# Patient Record
Sex: Male | Born: 1935 | Marital: Married | Smoking: Former smoker
Health system: Southern US, Community
[De-identification: ages and names within clinical notes are randomized; demographics above are authoritative.]

## PROBLEM LIST (undated history)

## (undated) DIAGNOSIS — Z87440 Personal history of urinary (tract) infections: Secondary | ICD-10-CM

## (undated) DIAGNOSIS — K921 Melena: Secondary | ICD-10-CM

## (undated) DIAGNOSIS — R030 Elevated blood-pressure reading, without diagnosis of hypertension: Secondary | ICD-10-CM

## (undated) DIAGNOSIS — Z5189 Encounter for other specified aftercare: Secondary | ICD-10-CM

## (undated) DIAGNOSIS — H409 Unspecified glaucoma: Secondary | ICD-10-CM

## (undated) DIAGNOSIS — B019 Varicella without complication: Secondary | ICD-10-CM

## (undated) DIAGNOSIS — I38 Endocarditis, valve unspecified: Secondary | ICD-10-CM

## (undated) DIAGNOSIS — K635 Polyp of colon: Secondary | ICD-10-CM

## (undated) DIAGNOSIS — E785 Hyperlipidemia, unspecified: Secondary | ICD-10-CM

## (undated) DIAGNOSIS — I2510 Atherosclerotic heart disease of native coronary artery without angina pectoris: Secondary | ICD-10-CM

## (undated) DIAGNOSIS — C61 Malignant neoplasm of prostate: Secondary | ICD-10-CM

## (undated) DIAGNOSIS — R011 Cardiac murmur, unspecified: Secondary | ICD-10-CM

## (undated) HISTORY — DX: Varicella without complication: B01.9

## (undated) HISTORY — DX: Malignant neoplasm of prostate: C61

## (undated) HISTORY — DX: Elevated blood-pressure reading, without diagnosis of hypertension: R03.0

## (undated) HISTORY — DX: Encounter for other specified aftercare: Z51.89

## (undated) HISTORY — DX: Hyperlipidemia, unspecified: E78.5

## (undated) HISTORY — PX: CORONARY ARTERY BYPASS GRAFT: SHX141

## (undated) HISTORY — PX: LUNG SURGERY: SHX703

## (undated) HISTORY — DX: Polyp of colon: K63.5

## (undated) HISTORY — DX: Unspecified glaucoma: H40.9

## (undated) HISTORY — DX: Melena: K92.1

## (undated) HISTORY — PX: PROSTATE SURGERY: SHX751

## (undated) HISTORY — DX: Cardiac murmur, unspecified: R01.1

## (undated) HISTORY — DX: Personal history of urinary (tract) infections: Z87.440

---

## 2016-03-21 ENCOUNTER — Telehealth: Payer: Self-pay | Admitting: *Deleted

## 2016-03-21 NOTE — Telephone Encounter (Signed)
Unable to reach patient at time of Pre-Visit Call.  Left message for patient to return call when available.    

## 2016-03-22 ENCOUNTER — Encounter: Payer: Self-pay | Admitting: Family Medicine

## 2016-03-22 ENCOUNTER — Ambulatory Visit (INDEPENDENT_AMBULATORY_CARE_PROVIDER_SITE_OTHER): Payer: Medicare Other | Admitting: Family Medicine

## 2016-03-22 VITALS — BP 124/50 | HR 83 | Temp 98.6°F | Ht 68.5 in | Wt 100.8 lb

## 2016-03-22 DIAGNOSIS — Z9889 Other specified postprocedural states: Secondary | ICD-10-CM | POA: Diagnosis not present

## 2016-03-22 DIAGNOSIS — Z933 Colostomy status: Secondary | ICD-10-CM | POA: Diagnosis not present

## 2016-03-22 DIAGNOSIS — R5381 Other malaise: Secondary | ICD-10-CM

## 2016-03-22 DIAGNOSIS — R1013 Epigastric pain: Secondary | ICD-10-CM

## 2016-03-22 DIAGNOSIS — C61 Malignant neoplasm of prostate: Secondary | ICD-10-CM | POA: Diagnosis not present

## 2016-03-22 DIAGNOSIS — R636 Underweight: Secondary | ICD-10-CM | POA: Insufficient documentation

## 2016-03-22 DIAGNOSIS — G8929 Other chronic pain: Secondary | ICD-10-CM

## 2016-03-22 MED ORDER — HYDROCODONE-ACETAMINOPHEN 5-325 MG PO TABS: 1.0000 | ORAL_TABLET | Freq: Four times a day (QID) | ORAL | 0 refills | Status: DC | PRN

## 2016-03-22 NOTE — Progress Notes (Signed)
[place] at [facility] [address], [place], [place] [address] [phone]  Date:  03/22/2016   Name:  Joseph Carrillo   DOB:  1926/09/10   MRN:  [id number]  PCP:  No primary care provider on file.    Chief Complaint: Establish Care (Pt is here to est care. constant abd pain that has been present over a year. )   History of Present Illness:  Joseph Carrillo is a 82 y.o. very pleasant male patient who presents with the following:  He is here today as a new patient- he moved to [place] from [facility] to live with his daughter Joseph Carrillo.   He was admitted to the hospital in [facility] in July for nearly a month for a "UTI", and he was then in a rehab facility for about 2 weeks. He was released from rehab this past weekend and moved to  so his daughter could help care for him  He has a suprapubic urostomy and colostomy bag due to prostate cancer; this surgery was done about 2 years ago.  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   He has noticed abdominal pain for about 6 months.  This has not changed.  He indicates the epigastric area as where he has the pain.  Advised him that I would like to get records and see what work-up is already done for this chronic problem prior to ordering studies and he agrees   No fever noted.  He is very thin- I am not sure if he has lost weight or if this is baseline He has some memory difficulty At home he uses a walker, but they use a WC for longer outings  ROS is limited by patient's memory loss.  He noted pain in his belly, pain in his groin.  Otherwise he has no complaint    There are no active problems to display for this patient.   Past Medical History:   Diagnosis Date  . Blood in stool   . Blood transfusion without reported diagnosis   . Chicken pox   . Colon polyp   . Elevated blood pressure reading   . Glaucoma   . Heart murmur   . Hx: UTI (urinary tract infection)   . Hyperlipidemia   . Prostate cancer [facility])     Past Surgical History:  Procedure Laterality Date  . CORONARY ARTERY BYPASS GRAFT    . LUNG SURGERY    . PROSTATE SURGERY      Social History  Substance Use Topics  . Smoking status: Former Research scientist (life sciences)  . Smokeless tobacco: Never Used  . Alcohol use No    No family history on file.  Not on File  Medication list has been reviewed and updated.  No current outpatient prescriptions on file prior to visit.   No current facility-administered medications on file prior to visit.     Review of Systems:  As per HPI- otherwise negative.  Physical Examination: Vitals:   03/22/16 1324  BP: (!) 124/50  Pulse: 83  Temp: 98.6 F (37 C)   Vitals:   03/22/16 1324  Weight: 100 lb 12.8 oz (  45.7 kg)  Height: 5' 8.5" (1.74 m)   Body mass index is 15.1 kg/m. Ideal Body Weight: Weight in (lb) to have BMI = 25: 166.5  GEN:NAD, Non-toxic, A & O x 3, very thin debilitated appearing man sitting in wheel chair.  Strong odor of urine HEENT: Atraumatic, Normocephalic. Neck supple. No masses, No LAD. Ears and Nose: No external deformity. CV: RRR, No M/G/R. No JVD. No thrill. No extra heart sounds. PULM: CTA B, no wheezes, crackles, rhonchi. No retractions. No resp. distress. No accessory muscle use. ABD: S, NT, ND, very thin Suprapubic cath appears to be clean and in good repair.  Colostomy bag is smelly and I am not sure if they are sealing it properly onto his skin.   He notes discomfort in her genital region- exam shows that he persistently drips some urine from his penis.  His diaper area is very malodorous and damp.  He is wearing a depends type undergarment with sanitary napkins inside to catch urine EXTR: No  c/c/e NEURO : moves all extremities and is able to stand on his own briefly PSYCH: poor historian- daughter endorses some dementia/ memory loss.  He is not delirious and is able to answer some questions and follows commands   Assessment and Plan: History of urostomy - Plan: Ambulatory referral to Urology, Ambulatory referral to [place] in place [facility]) - Plan: Ambulatory referral to Gastroenterology, Ambulatory referral to Home Health  Underweight - Plan: Ambulatory referral to [facility]) - Plan: Ambulatory referral to Home Health  Abdominal pain, chronic, epigastric - Plan: HYDROcodone-acetaminophen (NORCO/VICODIN) 5-325 MG tablet  Physical debility  Here today to establish care. [name] is an elderly man with history of urostomy and colonoscopy as a result of his prostate cancer.  He was recently discharged from a rehab facility and came to live with his daughter.  I do not think that Joseph Carrillo is being neglectful in any way but she does not have training or experience in taking care of this type of patient.  They urgently need skilled nursing as his hygiene is very poor and I am afraid he will soon have skin breakdown in his groin.  Ordered Home Health for him ASAP Referrals made to urology and also GI to help manage his stomas They are aware that he is underweight and are trying to work on this Refilled his hydrocodone which they report he uses chronically for pain.  We called his pharmacy in [facility] to confirm dosage Asked for his past medical records especially from recent hospital stay so I can learn more about his history   Went over all his medications and helped organize them.  Will taper him off clonidine as his BP is very low  Plan close follow-up in 2-3 weeks- sooner if not doing ok.   Signed [name], MD

## 2016-03-22 NOTE — Patient Instructions (Addendum)
It was very nice to meet you today.  I am going to refer you to urology doctor asap We will also get you set up to see a GI doctor. Please do a records release for [place] at check out.  We are contacting [facility] to get you some help Continue taking your aspirin and atorvastatin  I would like to get you off the clonidine- your blood pressure is too low. Please cut back to just one dose a day for 2 days, then stop taking it.   Please see me in 2-3 weeks to check on how you are doing

## 2016-03-22 NOTE — Progress Notes (Signed)
Pre visit review using our clinic review tool, if applicable. No additional management support is needed unless otherwise documented below in the visit note. 

## 2016-03-27 ENCOUNTER — Telehealth: Payer: Self-pay | Admitting: Family Medicine

## 2016-03-27 NOTE — Telephone Encounter (Signed)
Pt's daughter Joseph Carrillo [id number], called in to follow up on home health referral. She would like a call back to discuss further.    Thanks.

## 2016-03-27 NOTE — Telephone Encounter (Signed)
I spoke with daughter and informed her [facility] will contact her to schedule visit

## 2016-03-28 ENCOUNTER — Encounter (HOSPITAL_COMMUNITY): Payer: Self-pay | Admitting: Emergency Medicine

## 2016-03-28 ENCOUNTER — Inpatient Hospital Stay (HOSPITAL_COMMUNITY): Payer: Medicare Other

## 2016-03-28 ENCOUNTER — Inpatient Hospital Stay (HOSPITAL_COMMUNITY)
Admission: EM | Admit: 2016-03-28 | Discharge: 2016-04-05 | DRG: 693 | Disposition: A | Discharge status: Skilled Nursing Facility | Payer: Medicare Other | Attending: Internal Medicine | Admitting: Internal Medicine

## 2016-03-28 DIAGNOSIS — N1330 Unspecified hydronephrosis: Principal | ICD-10-CM

## 2016-03-28 DIAGNOSIS — N186 End stage renal disease: Secondary | ICD-10-CM | POA: Diagnosis not present

## 2016-03-28 DIAGNOSIS — N179 Acute kidney failure, unspecified: Secondary | ICD-10-CM | POA: Diagnosis present

## 2016-03-28 DIAGNOSIS — C61 Malignant neoplasm of prostate: Secondary | ICD-10-CM | POA: Diagnosis present

## 2016-03-28 DIAGNOSIS — R1013 Epigastric pain: Secondary | ICD-10-CM

## 2016-03-28 DIAGNOSIS — N390 Urinary tract infection, site not specified: Secondary | ICD-10-CM | POA: Diagnosis present

## 2016-03-28 DIAGNOSIS — Z8546 Personal history of malignant neoplasm of prostate: Secondary | ICD-10-CM

## 2016-03-28 DIAGNOSIS — E43 Unspecified severe protein-calorie malnutrition: Secondary | ICD-10-CM | POA: Diagnosis present

## 2016-03-28 DIAGNOSIS — N184 Chronic kidney disease, stage 4 (severe): Secondary | ICD-10-CM | POA: Diagnosis present

## 2016-03-28 DIAGNOSIS — N183 Chronic kidney disease, stage 3 (moderate): Secondary | ICD-10-CM | POA: Diagnosis not present

## 2016-03-28 DIAGNOSIS — K219 Gastro-esophageal reflux disease without esophagitis: Secondary | ICD-10-CM | POA: Diagnosis present

## 2016-03-28 DIAGNOSIS — Z9359 Other cystostomy status: Secondary | ICD-10-CM | POA: Diagnosis not present

## 2016-03-28 DIAGNOSIS — Z952 Presence of prosthetic heart valve: Secondary | ICD-10-CM | POA: Diagnosis not present

## 2016-03-28 DIAGNOSIS — D500 Iron deficiency anemia secondary to blood loss (chronic): Secondary | ICD-10-CM | POA: Diagnosis not present

## 2016-03-28 DIAGNOSIS — D631 Anemia in chronic kidney disease: Secondary | ICD-10-CM | POA: Diagnosis present

## 2016-03-28 DIAGNOSIS — R911 Solitary pulmonary nodule: Secondary | ICD-10-CM | POA: Diagnosis present

## 2016-03-28 DIAGNOSIS — H409 Unspecified glaucoma: Secondary | ICD-10-CM | POA: Diagnosis present

## 2016-03-28 DIAGNOSIS — Z79899 Other long term (current) drug therapy: Secondary | ICD-10-CM | POA: Diagnosis not present

## 2016-03-28 DIAGNOSIS — Z87891 Personal history of nicotine dependence: Secondary | ICD-10-CM | POA: Diagnosis not present

## 2016-03-28 DIAGNOSIS — D649 Anemia, unspecified: Secondary | ICD-10-CM | POA: Diagnosis present

## 2016-03-28 DIAGNOSIS — E785 Hyperlipidemia, unspecified: Secondary | ICD-10-CM | POA: Diagnosis present

## 2016-03-28 DIAGNOSIS — G8929 Other chronic pain: Secondary | ICD-10-CM

## 2016-03-28 DIAGNOSIS — N289 Disorder of kidney and ureter, unspecified: Secondary | ICD-10-CM

## 2016-03-28 DIAGNOSIS — I2510 Atherosclerotic heart disease of native coronary artery without angina pectoris: Secondary | ICD-10-CM | POA: Diagnosis present

## 2016-03-28 DIAGNOSIS — N189 Chronic kidney disease, unspecified: Secondary | ICD-10-CM | POA: Diagnosis not present

## 2016-03-28 DIAGNOSIS — Z933 Colostomy status: Secondary | ICD-10-CM

## 2016-03-28 DIAGNOSIS — R109 Unspecified abdominal pain: Secondary | ICD-10-CM

## 2016-03-28 DIAGNOSIS — Z681 Body mass index (BMI) 19 or less, adult: Secondary | ICD-10-CM

## 2016-03-28 DIAGNOSIS — D72829 Elevated white blood cell count, unspecified: Secondary | ICD-10-CM | POA: Diagnosis not present

## 2016-03-28 DIAGNOSIS — N185 Chronic kidney disease, stage 5: Secondary | ICD-10-CM | POA: Diagnosis not present

## 2016-03-28 DIAGNOSIS — N3000 Acute cystitis without hematuria: Secondary | ICD-10-CM

## 2016-03-28 DIAGNOSIS — E875 Hyperkalemia: Secondary | ICD-10-CM | POA: Diagnosis present

## 2016-03-28 DIAGNOSIS — Z923 Personal history of irradiation: Secondary | ICD-10-CM | POA: Diagnosis not present

## 2016-03-28 DIAGNOSIS — Z951 Presence of aortocoronary bypass graft: Secondary | ICD-10-CM

## 2016-03-28 DIAGNOSIS — R5381 Other malaise: Secondary | ICD-10-CM | POA: Diagnosis present

## 2016-03-28 DIAGNOSIS — T83512A Infection and inflammatory reaction due to nephrostomy catheter, initial encounter: Secondary | ICD-10-CM

## 2016-03-28 DIAGNOSIS — R7989 Other specified abnormal findings of blood chemistry: Secondary | ICD-10-CM | POA: Diagnosis not present

## 2016-03-28 HISTORY — DX: Endocarditis, valve unspecified: I38

## 2016-03-28 HISTORY — DX: Atherosclerotic heart disease of native coronary artery without angina pectoris: I25.10

## 2016-03-28 LAB — BASIC METABOLIC PANEL
ANION GAP: 9 (ref 5–15)
Anion gap: 11 (ref 5–15)
BUN: 79 mg/dL — AB (ref 6–20)
BUN: 79 mg/dL — ABNORMAL HIGH (ref 6–20)
CALCIUM: 9.9 mg/dL (ref 8.9–10.3)
CALCIUM: 9.7 mg/dL (ref 8.9–10.3)
CHLORIDE: 118 mmol/L — AB (ref 101–111)
CO2: 13 mmol/L — AB (ref 22–32)
CO2: 19 mmol/L — AB (ref 22–32)
CREATININE: 3.45 mg/dL — AB (ref 0.61–1.24)
CREATININE: 3.5 mg/dL — AB (ref 0.61–1.24)
Chloride: 114 mmol/L — ABNORMAL HIGH (ref 101–111)
GFR calc Af Amer: 17 mL/min — ABNORMAL LOW (ref >60)
GFR calc Af Amer: 17 mL/min — ABNORMAL LOW (ref >60)
GFR calc non Af Amer: 15 mL/min — ABNORMAL LOW (ref >60)
GFR, EST NON AFRICAN AMERICAN: 15 mL/min — AB (ref >60)
GLUCOSE: 206 mg/dL — AB (ref 65–99)
GLUCOSE: 153 mg/dL — AB (ref 65–99)
Potassium: 7.2 mmol/L (ref 3.5–5.1)
Potassium: 5.8 mmol/L — ABNORMAL HIGH (ref 3.5–5.1)
Sodium: 142 mmol/L (ref 135–145)
Sodium: 142 mmol/L (ref 135–145)

## 2016-03-28 LAB — URINALYSIS, ROUTINE W REFLEX MICROSCOPIC
BILIRUBIN URINE: NEGATIVE
GLUCOSE, UA: NEGATIVE mg/dL
KETONES UR: NEGATIVE mg/dL
Nitrite: NEGATIVE
PH: 7 (ref 5.0–8.0)
Specific Gravity, Urine: 1.016 (ref 1.005–1.030)

## 2016-03-28 LAB — COMPREHENSIVE METABOLIC PANEL WITH GFR
ALT: 62 U/L (ref 17–63)
AST: 38 U/L (ref 15–41)
Albumin: 3.1 g/dL — ABNORMAL LOW (ref 3.5–5.0)
Alkaline Phosphatase: 61 U/L (ref 38–126)
Anion gap: 6 (ref 5–15)
BUN: 87 mg/dL — ABNORMAL HIGH (ref 6–20)
CO2: 19 mmol/L — ABNORMAL LOW (ref 22–32)
Calcium: 9.5 mg/dL (ref 8.9–10.3)
Chloride: 115 mmol/L — ABNORMAL HIGH (ref 101–111)
Creatinine, Ser: 3.6 mg/dL — ABNORMAL HIGH (ref 0.61–1.24)
GFR calc Af Amer: 16 mL/min — ABNORMAL LOW
GFR calc non Af Amer: 14 mL/min — ABNORMAL LOW
Glucose, Bld: 90 mg/dL (ref 65–99)
Potassium: >7.5 mmol/L (ref 3.5–5.1)
Sodium: 140 mmol/L (ref 135–145)
Total Bilirubin: 0.9 mg/dL (ref 0.3–1.2)
Total Protein: 7.2 g/dL (ref 6.5–8.1)

## 2016-03-28 LAB — CBC WITH DIFFERENTIAL/PLATELET
BASOS ABS: 0 10*3/uL (ref 0.0–0.1)
Basophils Relative: 0 %
Eosinophils Absolute: 0.2 10*3/uL (ref 0.0–0.7)
Eosinophils Relative: 1 %
HEMATOCRIT: 30.1 % — AB (ref 39.0–52.0)
Hemoglobin: 9.3 g/dL — ABNORMAL LOW (ref 13.0–17.0)
LYMPHS ABS: 0.9 10*3/uL (ref 0.7–4.0)
Lymphocytes Relative: 6 %
MCH: 27.6 pg (ref 26.0–34.0)
MCHC: 30.9 g/dL (ref 30.0–36.0)
MCV: 89.3 fL (ref 78.0–100.0)
MONO ABS: 1.1 10*3/uL — AB (ref 0.1–1.0)
MONOS PCT: 7 %
NEUTROS ABS: 12.8 10*3/uL — AB (ref 1.7–7.7)
Neutrophils Relative %: 86 %
PLATELETS: 188 10*3/uL (ref 150–400)
RBC: 3.37 MIL/uL — ABNORMAL LOW (ref 4.22–5.81)
RDW: 17.3 % — AB (ref 11.5–15.5)
WBC: 15 10*3/uL — AB (ref 4.0–10.5)

## 2016-03-28 LAB — URINE MICROSCOPIC-ADD ON

## 2016-03-28 LAB — TROPONIN I: TROPONIN I: 0.03 ng/mL — AB (ref <0.03)

## 2016-03-28 LAB — PROTIME-INR
INR: 1.09
PROTHROMBIN TIME: 14.1 s (ref 11.4–15.2)

## 2016-03-28 LAB — MRSA PCR SCREENING: MRSA by PCR: NEGATIVE

## 2016-03-28 LAB — APTT: APTT: 42 s — AB (ref 24–36)

## 2016-03-28 MED ORDER — SODIUM BICARBONATE 8.4 % IV SOLN
INTRAVENOUS | Status: DC
  Administered 2016-03-28 – 2016-03-30 (×4): via INTRAVENOUS
  Filled 2016-03-28 (×9): qty 50

## 2016-03-28 MED ORDER — ALBUTEROL SULFATE (2.5 MG/3ML) 0.083% IN NEBU: 10.0000 mg | INHALATION_SOLUTION | Freq: Once | RESPIRATORY_TRACT | Status: DC

## 2016-03-28 MED ORDER — HEPARIN SODIUM (PORCINE) 5000 UNIT/ML IJ SOLN: 5000.0000 [IU] | Freq: Three times a day (TID) | INTRAMUSCULAR | Status: DC

## 2016-03-28 MED ORDER — ASPIRIN 325 MG PO TABS: 325.0000 mg | ORAL_TABLET | ORAL | Status: DC

## 2016-03-28 MED ORDER — IOPAMIDOL (ISOVUE-300) INJECTION 61%
15.0000 mL | Freq: Once | INTRAVENOUS | Status: AC | PRN
  Administered 2016-03-28: 15 mL via ORAL

## 2016-03-28 MED ORDER — ALBUTEROL (5 MG/ML) CONTINUOUS INHALATION SOLN
10.0000 mg/h | INHALATION_SOLUTION | RESPIRATORY_TRACT | Status: DC
  Administered 2016-03-28: 10 mg/h via RESPIRATORY_TRACT
  Filled 2016-03-28: qty 20

## 2016-03-28 MED ORDER — MORPHINE SULFATE (PF) 2 MG/ML IV SOLN: 1.0000 mg | INTRAVENOUS | Status: DC | PRN

## 2016-03-28 MED ORDER — NITROGLYCERIN IN D5W 200-5 MCG/ML-% IV SOLN
0.0000 ug/min | INTRAVENOUS | Status: DC
  Filled 2016-03-28: qty 250

## 2016-03-28 MED ORDER — HYDRALAZINE HCL 20 MG/ML IJ SOLN: 10.0000 mg | INTRAMUSCULAR | Status: DC | PRN

## 2016-03-28 MED ORDER — CLONIDINE HCL ER 0.1 MG PO TB12
0.1000 mg | ORAL_TABLET | Freq: Two times a day (BID) | ORAL | Status: DC
  Administered 2016-03-28 – 2016-04-05 (×16): 0.1 mg via ORAL
  Filled 2016-03-28 (×20): qty 1

## 2016-03-28 MED ORDER — METOPROLOL TARTRATE 12.5 MG HALF TABLET
12.5000 mg | ORAL_TABLET | Freq: Two times a day (BID) | ORAL | Status: DC
  Administered 2016-03-28 – 2016-04-05 (×15): 12.5 mg via ORAL
  Filled 2016-03-28 (×15): qty 1

## 2016-03-28 MED ORDER — HYDRALAZINE HCL 20 MG/ML IJ SOLN
10.0000 mg | INTRAMUSCULAR | Status: DC | PRN
  Administered 2016-03-28: 10 mg via INTRAVENOUS
  Filled 2016-03-28: qty 1

## 2016-03-28 MED ORDER — SODIUM POLYSTYRENE SULFONATE 15 GM/60ML PO SUSP
30.0000 g | Freq: Once | ORAL | Status: AC
  Administered 2016-03-28: 30 g via ORAL
  Filled 2016-03-28: qty 120

## 2016-03-28 MED ORDER — ATORVASTATIN CALCIUM 20 MG PO TABS
20.0000 mg | ORAL_TABLET | Freq: Every day | ORAL | Status: DC
  Administered 2016-03-28 – 2016-04-05 (×9): 20 mg via ORAL
  Filled 2016-03-28 (×8): qty 1
  Filled 2016-03-28: qty 2

## 2016-03-28 MED ORDER — SODIUM CHLORIDE 0.9% FLUSH
3.0000 mL | Freq: Two times a day (BID) | INTRAVENOUS | Status: DC
  Administered 2016-03-29 – 2016-03-31 (×2): 3 mL via INTRAVENOUS

## 2016-03-28 MED ORDER — SODIUM CHLORIDE 0.9 % IV BOLUS (SEPSIS)
1000.0000 mL | Freq: Once | INTRAVENOUS | Status: AC
  Administered 2016-03-28: 1000 mL via INTRAVENOUS

## 2016-03-28 MED ORDER — SODIUM POLYSTYRENE SULFONATE 15 GM/60ML PO SUSP
45.0000 g | Freq: Once | ORAL | Status: AC
  Administered 2016-03-28: 45 g via ORAL
  Filled 2016-03-28: qty 180

## 2016-03-28 MED ORDER — NITROGLYCERIN 0.4 MG SL SUBL
SUBLINGUAL_TABLET | SUBLINGUAL | Status: AC
  Filled 2016-03-28: qty 1

## 2016-03-28 MED ORDER — SODIUM CHLORIDE 0.9 % IV SOLN
1.0000 g | Freq: Once | INTRAVENOUS | Status: AC
  Administered 2016-03-28: 1 g via INTRAVENOUS
  Filled 2016-03-28: qty 10

## 2016-03-28 MED ORDER — ASPIRIN 81 MG PO CHEW
324.0000 mg | CHEWABLE_TABLET | Freq: Once | ORAL | Status: AC
  Administered 2016-03-28: 324 mg via ORAL

## 2016-03-28 MED ORDER — NITROGLYCERIN 0.4 MG SL SUBL
0.4000 mg | SUBLINGUAL_TABLET | SUBLINGUAL | Status: DC | PRN
  Administered 2016-03-28: 0.4 mg via SUBLINGUAL

## 2016-03-28 MED ORDER — HEPARIN (PORCINE) IN NACL 100-0.45 UNIT/ML-% IJ SOLN
600.0000 [IU]/h | INTRAMUSCULAR | Status: DC
  Administered 2016-03-28: 600 [IU]/h via INTRAVENOUS
  Filled 2016-03-28: qty 250

## 2016-03-28 MED ORDER — DEXTROSE 5 % IV SOLN
1.0000 g | INTRAVENOUS | Status: DC
  Administered 2016-03-28 – 2016-04-04 (×7): 1 g via INTRAVENOUS
  Filled 2016-03-28 (×9): qty 10

## 2016-03-28 MED ORDER — HEPARIN BOLUS VIA INFUSION
3000.0000 [IU] | Freq: Once | INTRAVENOUS | Status: AC
  Administered 2016-03-28: 3000 [IU] via INTRAVENOUS
  Filled 2016-03-28: qty 3000

## 2016-03-28 MED ORDER — SODIUM BICARBONATE 8.4 % IV SOLN
50.0000 meq | Freq: Once | INTRAVENOUS | Status: AC
  Administered 2016-03-28: 50 meq via INTRAVENOUS
  Filled 2016-03-28: qty 50

## 2016-03-28 MED ORDER — INSULIN ASPART 100 UNIT/ML IV SOLN
10.0000 [IU] | Freq: Once | INTRAVENOUS | Status: AC
  Administered 2016-03-28: 10 [IU] via INTRAVENOUS
  Filled 2016-03-28: qty 0.1

## 2016-03-28 MED ORDER — ASPIRIN 81 MG PO CHEW
CHEWABLE_TABLET | ORAL | Status: AC
  Filled 2016-03-28: qty 4

## 2016-03-28 MED ORDER — DEXTROSE 50 % IV SOLN
1.0000 | Freq: Once | INTRAVENOUS | Status: AC
  Administered 2016-03-28: 50 mL via INTRAVENOUS
  Filled 2016-03-28: qty 50

## 2016-03-28 MED ORDER — HYDROCODONE-ACETAMINOPHEN 5-325 MG PO TABS
1.0000 | ORAL_TABLET | Freq: Four times a day (QID) | ORAL | Status: DC | PRN
  Administered 2016-03-28 – 2016-04-04 (×12): 1 via ORAL
  Filled 2016-03-28 (×13): qty 1

## 2016-03-28 MED ORDER — VANCOMYCIN HCL IN DEXTROSE 750-5 MG/150ML-% IV SOLN
750.0000 mg | Freq: Once | INTRAVENOUS | Status: AC
  Administered 2016-03-28: 750 mg via INTRAVENOUS
  Filled 2016-03-28: qty 150

## 2016-03-28 NOTE — Discharge Planning (Signed)
Thank you to ED team at [facility] for the care of this patient.  [facility] is currently active with this patient for SN, PT, OT, and Speech Therapy.  I have notified [name] [facility] of our status.

## 2016-03-28 NOTE — Consult Note (Signed)
Urology Consult  Referring physician: Reece Packer Reason for referral: Hydronephrosis  Chief Complaint: Hydronephrosis  History of Present Illness: Patient admitted with abdominal pain and decreased urine output and s/p tube; on iv antibiotics and hydration; recent admission for UTI in [place]; history of prostate cancer and colostomy; serum Cr 3.6 with no baseline available; WBC 15; serum Cr> 7.5; dehydrated on admission; afebrile; BMP being carefully followed as noted;   Patient has needed dialysis once before  CTscan: bilateral hydro and hydroureter (right greater than left); bladder is decompressed; no stones; prostate enlarged; PROSTATE is impressively enlarged  It sounds like the patient had radiation therapy many years ago for prostate cancer; 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 Blood in stool   . Blood transfusion without reported diagnosis   . Chicken pox   . Colon polyp   . Elevated blood pressure reading   . Glaucoma   . Heart murmur   . Hx: UTI (urinary tract infection)   . Hyperlipidemia   . Prostate cancer [facility])    Past Surgical History:  Procedure Laterality Date  . CORONARY ARTERY BYPASS GRAFT    . LUNG SURGERY    . PROSTATE SURGERY      Medications: I have reviewed the patient's current medications. Allergies: No Known Allergies  History reviewed. No pertinent family history. Social History:  reports that he has quit smoking. He has never used smokeless tobacco. He reports that he does not drink alcohol or use drugs.  ROS: All systems are reviewed  and negative except as noted. Rest negative  Physical Exam:  Vital signs in last 24 hours: Temp:  [97.9 F (36.6 C)-98.7 F (37.1 C)] 98.7 F (37.1 C) (09/06 1600) Pulse Rate:  [55-103] 102 (09/06 1610) Resp:  [16-18] 18 (09/06 1610) BP: (154-200)/(49-71) 185/68 (09/06 1610) SpO2:  [99 %-100 %] 99 % (09/06 1610) Weight:  [51.6 kg (113 lb 12.1 oz)] 51.6 kg (113 lb 12.1 oz) (09/06 1600)  Cardiovascular: Skin warm; not flushed Respiratory: Breaths quiet; no shortness of breath Abdomen: No masses Neurological: Normal sensation to touch Musculoskeletal: Normal motor function arms and legs Lymphatics: No inguinal adenopathy Skin: No rashes Genitourinary:s/p tube draining well high volume  Laboratory Data:  Results for orders placed or performed during the hospital encounter of 03/28/16 (from the past 72 hour(s))  Urinalysis, Routine w reflex microscopic     Status: Abnormal   Collection Time: 03/28/16  9:49 AM  Result Value Ref Range   Color, Urine YELLOW YELLOW   APPearance TURBID (A) CLEAR   Specific Gravity, Urine 1.016 1.005 - 1.030   pH 7.0 5.0 - 8.0   Glucose, UA NEGATIVE NEGATIVE mg/dL   Hgb urine dipstick LARGE (A) NEGATIVE   Bilirubin Urine NEGATIVE NEGATIVE   Ketones, ur NEGATIVE NEGATIVE mg/dL   Protein, ur >300 (A) NEGATIVE mg/dL   Nitrite NEGATIVE NEGATIVE   Leukocytes, UA LARGE (A) NEGATIVE  Urine microscopic-add on     Status: Abnormal   Collection Time: 03/28/16  9:49 AM  Result Value Ref Range   Squamous Epithelial / LPF 0-5 (A) NONE SEEN  WBC, UA TOO NUMEROUS TO COUNT 0 - 5 WBC/hpf   RBC / HPF 6-30 0 - 5 RBC/hpf   Bacteria, UA MANY (A) NONE SEEN  Comprehensive metabolic panel     Status: Abnormal   Collection Time: 03/28/16 10:07 AM  Result Value Ref Range   Sodium 140 135 - 145 mmol/L   Potassium >7.5 (HH) 3.5 - 5.1 mmol/L    Comment: NO VISIBLE HEMOLYSIS REPEATED TO VERIFY CRITICAL RESULT CALLED TO, READ BACK BY AND VERIFIED WITH: KESLAR,R. RN AT  1100 03/28/16 MULLINS,T    Chloride 115 (H) 101 - 111 mmol/L   CO2 19 (L) 22 - 32 mmol/L   Glucose, Bld 90 65 - 99 mg/dL   BUN 87 (H) 6 - 20 mg/dL   Creatinine, Ser 3.60 (H) 0.61 - 1.24 mg/dL   Calcium 9.5 8.9 - 10.3 mg/dL   Total Protein 7.2 6.5 - 8.1 g/dL   Albumin 3.1 (L) 3.5 - 5.0 g/dL   AST 38 15 - 41 U/L   ALT 62 17 - 63 U/L   Alkaline Phosphatase 61 38 - 126 U/L   Total Bilirubin 0.9 0.3 - 1.2 mg/dL   GFR calc non Af Amer 14 (L) >60 mL/min   GFR calc Af Amer 16 (L) >60 mL/min    Comment: (NOTE) The eGFR has been calculated using the CKD EPI equation. This calculation has not been validated in all clinical situations. eGFR's persistently <60 mL/min signify possible Chronic Kidney Disease.    Anion gap 6 5 - 15  CBC with Differential     Status: Abnormal   Collection Time: 03/28/16 10:07 AM  Result Value Ref Range   WBC 15.0 (H) 4.0 - 10.5 K/uL   RBC 3.37 (L) 4.22 - 5.81 MIL/uL   Hemoglobin 9.3 (L) 13.0 - 17.0 g/dL   HCT 30.1 (L) 39.0 - 52.0 %   MCV 89.3 78.0 - 100.0 fL   MCH 27.6 26.0 - 34.0 pg   MCHC 30.9 30.0 - 36.0 g/dL   RDW 17.3 (H) 11.5 - 15.5 %   Platelets 188 150 - 400 K/uL   Neutrophils Relative % 86 %   Lymphocytes Relative 6 %   Monocytes Relative 7 %   Eosinophils Relative 1 %   Basophils Relative 0 %   Neutro Abs 12.8 (H) 1.7 - 7.7 K/uL   Lymphs Abs 0.9 0.7 - 4.0 K/uL   Monocytes Absolute 1.1 (H) 0.1 - 1.0 K/uL   Eosinophils Absolute 0.2 0.0 - 0.7 K/uL   Basophils Absolute 0.0 0.0 - 0.1 K/uL   Smear Review MORPHOLOGY UNREMARKABLE    No results found for this or any previous visit (from the past 240 hour(s)). Creatinine:  Recent Labs  03/28/16 1007  CREATININE 3.60*    Xrays: See report/chart reviewed  Impression/Assessment:  This patient likely has chronic hydro and chronic renal insufficiency Will follow If forced to do so for renal failure/sepsis, etc he would need perc tubes - unlikely to get stents in due to radiation/large  prostate/and described fistula  Plan:  Will follow Hydrate and follow for ? Perc tubes Suggest could you ask daughter or do research on [place] admission last week- they would have done labs for baseline renal function and perhaps a renal xray; similarly apparently a urologist Dr Carlis Abbott did s/p tube one year ago in [place]- ? Get medical records  Anahlia Iseminger A 03/28/2016, 5:15 PM

## 2016-03-28 NOTE — Progress Notes (Signed)
ANTICOAGULATION CONSULT NOTE - Initial Consult  Pharmacy Consult for heparin IV Indication: chest pain/ACS  No Known Allergies  Patient Measurements: Height: 5\' 8"  (172.7 cm) Weight: 113 lb 12.1 oz (51.6 kg) IBW/kg (Calculated) : 68.4 Heparin Dosing Weight: TBW  Vital Signs: Temp: 99 F (37.2 C) (09/06 1940) Temp Source: Oral (09/06 1940) BP: 119/67 (09/06 2109) Pulse Rate: 113 (09/06 2109)  Labs:  Recent Labs  03/28/16 1007 03/28/16 1808  HGB 9.3*  --   HCT 30.1*  --   PLT 188  --   CREATININE 3.60* 3.45*    Estimated Creatinine Clearance: 11.2 mL/min (by C-G formula based on SCr of 3.45 mg/dL).   Medical History: Past Medical History:  Diagnosis Date  . Blood in stool   . Blood transfusion without reported diagnosis   . Chicken pox   . Colon polyp   . Elevated blood pressure reading   . Glaucoma   . Heart murmur   . Hx: UTI (urinary tract infection)   . Hyperlipidemia   . Prostate cancer ([place])     Medications:  Prescriptions Prior to Admission  Medication Sig Dispense Refill Last Dose  . atorvastatin (LIPITOR) 20 MG tablet Take 20 mg by mouth daily.   03/27/2016 at Unknown time  . cloNIDine HCl (KAPVAY) 0.1 MG TB12 ER tablet Take 0.1 mg by mouth 2 (two) times daily.   03/27/2016 at Unknown time  . HYDROcodone-acetaminophen (NORCO/VICODIN) 5-325 MG tablet Take 1 tablet by mouth every 6 (six) hours as needed. 40 tablet 0 03/27/2016 at Unknown time  . sodium bicarbonate 650 MG tablet Take 650 mg by mouth 4 (four) times daily.   03/27/2016 at Unknown time  . nitroGLYCERIN (NITROSTAT) 0.4 MG SL tablet Place 0.4 mg under the tongue every 5 (five) minutes as needed for chest pain.   years ago   Scheduled:  . aspirin      . nitroGLYCERIN      . atorvastatin  20 mg Oral Daily  . cefTRIAXone (ROCEPHIN)  IV  1 g Intravenous Q24H  . cloNIDine HCl  0.1 mg Oral BID  . heparin  5,000 Units Subcutaneous Q8H  . sodium chloride flush  3 mL Intravenous Q12H     Assessment: Joseph Jenkinsis a88 y.o.malewith prostate cancer and suprapubic catheter, colostomy, recent hospitalization for UTI presents on 9/6/2017with 3-4 days abd pain and decreased UOP.  Now with chest pain, ordered NTG, ASA x1   Baseline INR, aPTT: pending  Prior anticoagulation: none  Significant events:  Today, 03/28/2016:  CBC: hgb low (likely d/t anemia from CKD), Plt wnl  No bleeding or infusion issues per nursing  CrCl: 11.2 ml/min  Goal of Therapy: Heparin level 0.3-0.7 units/ml Monitor platelets by anticoagulation protocol: Yes  Plan:  Heparin 3000 units IV bolus x 1  Heparin 600 units/hr IV infusion  Check heparin level 8 hrs after start  Daily CBC, daily heparin level once stable  Monitor for signs of bleeding or thrombosis   Reuel Boom, PharmD Pager: [phone] 03/28/2016, 9:20 PM

## 2016-03-28 NOTE — Progress Notes (Addendum)
Shift event: Brief HPI: Pt admitted by [facility] earlier today after presenting to ED with decreased UO, weakness, and abdominal pain. Found to have creat of 3.60 with K over 7. Attending spoke with nephrology who advised no HD urgently, so pt was admitted to [facility]. Tests also showed hydronephrosis and urology consulted. Urology plan to watch, treat medically for now, and consider perc tubes. Pt placed on Vanc and Rocephin for UTI (UTI with hospitalization at [facility] one month ago).  Pt was treated aggressively with potassium lowering medications (bicarb, Ca gluconate, insulin, kayexelate) to lower the K+ level. Next K+ at 1930 was slightly lower, and additional Kayexelate given. NP asked to f/up q4 hr BMP per attending. However, prior to lab resulting (due at 2200 hrs), around 2050 hrs, RN paged because pt was having left shoulder pain with ST elevation on tele. NP to bedside. 12 lead confirmed ST elevation.  S: Pt denies actual chest pain but states his left shoulder began to ache about 2 hours ago and is now worse and moving distally to biceps area. Dull ache 7/10. Per RN, pt had some c/o epigastric pain earlier in shift and was given Vicodin. Pt denies SOB at this time, but says has had some since admission. + weakness since admission. No n/v or diaphoresis. Hasn't used NTG tabs "in years". No SOB at home. Anticoag: says he doesn't remember ever being on Coumadin or Plavix. Used to be on a baby ASA qd but it was stopped for ? Reason. Says he received "shots in my belly" during last hospitalization and at rehab (which I assume to be Lovenox).  PMHx per pt: Valve surgery at [facility] in early 2000s and CABG in 2001. Hx prostate cancer with chronic SP tube placed one yr ago at [facility]. Cardiologist: Dr. Merrilyn Puma at [facility]. Hasn't seen any doctors in [place] yet. Other per H&P. Per wife: Pt did have what sounds to be peritoneal dialysis once for about 24 hours. Dr has never mentioned HD to them.She doesn't recall any hx blood  thinners either. She gives permission to get all of pt's records from [facility].  Activity/social: quit smoking in 1973. Lived at home with wife until one month ago when he was hospitalized for UTI and sent to rehab afterwards. Wife, unfortunately, is having cancer treatments in [place] and is living with her sister for now. After rehab, daughter brought pt to [place] to live here until wife/pt health problems get better and they can return to live in [facility]. Pt's baseline activity level is he walks with a walker.  O: Joseph Carrillo appears chronically ill but fairly well, not toxic appearing. Alert and oriented x 3. Pleasant, appropriate, appears comfortable. BP-174/90, 119/60, 140/80. HR 130 down to 106. RR normal. O2 sat normal on RA and normal on 2L O2. Card: tachycardic, + ST elevation on tele and EKG. ST elevation decreased over duration of visit. Resp: normal effort, CTA. Abd: Soft. MSK: MOE x 4. Neuro: non focal exam.  Skin: warm and dry.  A/P: 1. Left arm pain, epigastric pain ? lower sternal with EKG changes and ST elevation. NTG x 1 relieved pain. O2 per  2L placed. ASA 324mg  chewed. Called Dr. Wynonia Lawman on call for cardio. Case discussed along with findings and treatment. Recs: NTG drip, Heparin drip, BB. TF to [facility] but not urgent as pt is not a candidate for procedures at this time due to CKD and hyperkalemia. Cardio will see pt when transferred to [facility] or sooner if worsens. Pt  has multiple chronic health issues which likely will prevent him from invasive procedures and goals of care discussion is warranted.  Cycle troponins, r/p EKG in am. Lipid panel in am. NPO except meds until consults completed.  2. Code status: NP spoke with pt about code status. He wishes for everything to be done. FULL CODE.  3. Urinary retention-per urology, may need perc tube.  4. Hyperkalemia and CKD-follow labs and tx. Should K not decrease, call nephrology back. Monitor output.  TF to [facility] as bed available.  Family  communication: NP first called daughter, only name/number on chart. However, she informed me that pt was married and gave me the wife's number. Wife is Daian Schlegel [phone]. NP spoke with Mrs. Nihart. She informed me of various info as noted above. States that Mr. Stoneback has always wanted to be resuscitated. NP explained events of tonight, including treatment plan and possible worsening morbidity or even mortality.She understands, states "he has been through a lot". Wife wants to be called with any updates and wishes to be updated daily by attending. Will put her number on chart. Depending on her cancer treatments, she may be able to visit this weekend.  Clance Boll, NP [facility]  Update: Pt has been chest pain free since NTG SL. VSS.  Last K 5.8 and awaiting this one due now to see if further tx is needed. Creat remains about the same, elevated. Troponin #1 negative. KJKG, NP [facility] Update: [facility] transporting pt to [place], Rm [id number].  KJKG, NP [facility] Update: Report given to oncoming attending at 0700. BMP pending at this time. 2nd troponin .04. 6am EKG without noted ST elevation but will defer EKG eval and tx plan ultimately to cardiology.  KJKG, NP [facility]

## 2016-03-28 NOTE — ED Triage Notes (Signed)
Pt reports abd pain. Pt reports he has a suprapubic catheter. No urine in bag when he woke up, but started draining after awakening. Also reports L pinky toe pain.

## 2016-03-28 NOTE — H&P (Signed)
History and Physical    Joseph Carrillo [id number] DOB: June 14, 1927 DOA: 03/28/2016  PCP: [name], MD  Outpatient Specialists: none Patient coming from: daughters home. Moved here from [place] Complaint: Abdominal pain  History provided by patient and daughter who is at bedside.   HPI: Joseph Carrillo is a 82 y.o. male with medical history significant of suprapubic catheter due to prostate cancer, colostomy, recent hospitalization for UTI, who presents [place] with 3-4 days of abdominal pain and decreased urine output.  Patient was admitted to hospital in [place] for urinary tract infection and discharged to nursing facility for rehabilitation. He was discharged from nursing facility about a week ago and moved to [place] to live with his daughter.   Patient described the abdominal pain as stabbing and constant. He reports decreased urine output when lying but noted urine coming into a urine back when he stood. He denies blood in urine or ostomy bag. Daughter stated that patient gets catheter changed on the 11th of every month. Patient said that he was told he had history of kidney disease. Daughter thinks that patient received one session of hemodialysis in the past. He denies fever, chills or new back pain. Denies nausea, vomiting and diarrhea. Denies chest pain or shortness of breath.   ED Course: Vital signs within normal limits. CMP significant for potassium greater than 7.5, chloride 15, bicarbonate 19, BUN 87, creatinine 3.6. CBC remarkable for WBC 15, hemoglobin 9.3, hematocrit 30.1. Urinalysis remarkable for many bacteria, large hemoglobin, large leukocytes  and protein greater than 300. EKG with mildly peaked T waves in anterior leads. Received calcium chloride 1 g, dextrose 50%, insulin 10 units IV, sodium bicarbonate x2, Kayexalate, albuterol and a liter of normal saline bolus. Nephrology was consulted over the phone.   Review of Systems: As per HPI otherwise 10 point  review of systems negative.   Past Medical History:  Diagnosis Date  . Blood in stool   . Blood transfusion without reported diagnosis   . Chicken pox   . Colon polyp   . Elevated blood pressure reading   . Glaucoma   . Heart murmur   . Hx: UTI (urinary tract infection)   . Hyperlipidemia   . Prostate cancer [facility])     Past Surgical History:  Procedure Laterality Date  . CORONARY ARTERY BYPASS GRAFT    . LUNG SURGERY    . PROSTATE SURGERY       reports that he has quit smoking. He has never used smokeless tobacco. He reports that he does not drink alcohol or use drugs.  No Known Allergies  History reviewed. No pertinent family history.  Prior to Admission medications   Medication Sig Start Date End Date Taking? Authorizing Provider  atorvastatin (LIPITOR) 20 MG tablet Take 20 mg by mouth daily.   Yes Historical Provider, MD  cloNIDine HCl (KAPVAY) 0.1 MG TB12 ER tablet Take 0.1 mg by mouth 2 (two) times daily.   Yes Historical Provider, MD  HYDROcodone-acetaminophen (NORCO/VICODIN) 5-325 MG tablet Take 1 tablet by mouth every 6 (six) hours as needed. 03/22/16  Yes Gay Filler Copland, MD  sodium bicarbonate 650 MG tablet Take 650 mg by mouth 4 (four) times daily.   Yes Historical Provider, MD  nitroGLYCERIN (NITROSTAT) 0.4 MG SL tablet Place 0.4 mg under the tongue every 5 (five) minutes as needed for chest pain.    Historical Provider, MD    Physical Exam: Vitals:   03/28/16 0831 03/28/16 1113 03/28/16 1253  BP: 154/71 (!) 161/49 (!) 159/50  Pulse: 92 62 68  Resp: 16 18 17   Temp: 97.9 F (36.6 C)    TempSrc: Oral    SpO2: 100% 100% 99%      Constitutional: NAD, calm, comfortable Vitals:   03/28/16 0831 03/28/16 1113 03/28/16 1253  BP: 154/71 (!) 161/49 (!) 159/50  Pulse: 92 62 68  Resp: 16 18 17   Temp: 97.9 F (36.6 C)    TempSrc: Oral    SpO2: 100% 100% 99%   Gen: Frail looking patient, no apparent distress Eyes: PERRL, lids and conjunctivae  normal Respiratory: clear to auscultation bilaterally, no wheezing, no crackles. Normal respiratory effort. No accessory muscle use.  Cardiovascular: Regular rate and rhythm, 2/6SEM. No extremity edema. 2+ pedal pulses.  Abdomen: no tenderness, no masses palpated. Bowel sounds positive. No fecal content in ostomy bag. Suprapubic catheter in place with no surrounding skin erythema or discharge. Urine color appears normal in a urine bag.  Musculoskeletal: Wasted, no focal tenderness,substantial toenails Skin: no rashes, lesions, ulcers. No induration Neurologic: CN 2-12 grossly intact. Strength 5/5 in all 4.  Psychiatric: Normal judgment and insight. Alert and oriented x 3. Normal mood.   Labs on Admission: I have personally reviewed following labs and imaging studies  CBC:  Recent Labs Lab 03/28/16 1007  WBC 15.0*  NEUTROABS 12.8*  HGB 9.3*  HCT 30.1*  MCV 89.3  PLT 0000000   Basic Metabolic Panel:  Recent Labs Lab 03/28/16 1007  NA 140  K >7.5*  CL 115*  CO2 19*  GLUCOSE 90  BUN 87*  CREATININE 3.60*  CALCIUM 9.5   GFR: Estimated Creatinine Clearance: 9.5 mL/min (by C-G formula based on SCr of 3.6 mg/dL). Liver Function Tests:  Recent Labs Lab 03/28/16 1007  AST 38  ALT 62  ALKPHOS 61  BILITOT 0.9  PROT 7.2  ALBUMIN 3.1*   No results for input(s): LIPASE, AMYLASE in the last 168 hours. No results for input(s): AMMONIA in the last 168 hours. Coagulation Profile: No results for input(s): INR, PROTIME in the last 168 hours. Cardiac Enzymes: No results for input(s): CKTOTAL, CKMB, CKMBINDEX, TROPONINI in the last 168 hours. BNP (last 3 results) No results for input(s): PROBNP in the last 8760 hours. HbA1C: No results for input(s): HGBA1C in the last 72 hours. CBG: No results for input(s): GLUCAP in the last 168 hours. Lipid Profile: No results for input(s): CHOL, HDL, LDLCALC, TRIG, CHOLHDL, LDLDIRECT in the last 72 hours. Thyroid Function Tests: No results  for input(s): TSH, T4TOTAL, FREET4, T3FREE, THYROIDAB in the last 72 hours. Anemia Panel: No results for input(s): VITAMINB12, FOLATE, FERRITIN, TIBC, IRON, RETICCTPCT in the last 72 hours. Urine analysis:    Component Value Date/Time   COLORURINE YELLOW 03/28/2016 0949   APPEARANCEUR TURBID (A) 03/28/2016 0949   LABSPEC 1.016 03/28/2016 0949   PHURINE 7.0 03/28/2016 0949   GLUCOSEU NEGATIVE 03/28/2016 0949   HGBUR LARGE (A) 03/28/2016 0949   BILIRUBINUR NEGATIVE 03/28/2016 0949   KETONESUR NEGATIVE 03/28/2016 0949   PROTEINUR >300 (A) 03/28/2016 0949   NITRITE NEGATIVE 03/28/2016 0949   LEUKOCYTESUR LARGE (A) 03/28/2016 0949   Sepsis Labs: @LABRCNTIP (procalcitonin:4,lacticidven:4) )No results found for this or any previous visit (from the past 240 hour(s)).   Radiological Exams on Admission: No results found.  EKG: Independently reviewed.   Assessment/Plan Active Problems:   Colostomy in place [facility])   Prostate cancer ([place])   Physical debility   Hyperkalemia   CKD (chronic kidney  disease)   Suprapubic catheter ([place])   UTI (urinary tract infection)   Leukocytosis   Anemia  Hyperkalemia: Status post calcium chloride 1, dextrose, insulin, albuterol, bicarbonate and Kayexalate in ED -Telemetry -BMP every now and every 4 hours -Continue albuterol nebulizer -Consider hemodialysis if no improvement with the above measures  CKD-4: Creatinine 3.6. BUN 87. No prior to compare to. He moved here from [facility]. No chest pain or AMS or bleeding -Avoid nephrotoxic drugs -Repeat BMP  UTI/colonization: Patient with suprapubic catheter. UA with many bacteria, large leukocyte estrace, large hemoglobin and protienuria >300. CBC significant for leukocytosis to 15.  -Vanc and CTX for broader coverage. UA also without nitrite which makes gram-positive infection likely.  -Urine for urine culture. -CT abdomen/pelvis w/o contrast  DVT prophylaxis: Heparin subcutaneous given CKD Code Status:  Full  Family Communication: Daughter at bedside  Disposition Plan: To be determined. Patient came from daughter's home Consults called: Nephrology contacted over the phone  Admission status: Admit to stepdown   Wendee Beavers, MD Resident Pager [phone]  If 7PM-7AM, please contact night-coverage [email or web address] Password [email or web address]  03/28/2016, 2:14 PM

## 2016-03-28 NOTE — ED Notes (Signed)
Patient transported to CT 

## 2016-03-28 NOTE — Progress Notes (Signed)
Contact from [facility] coordinator Tim to updated Cm that pt is active for [facility], PT, [place] [facility] services with [facility]

## 2016-03-28 NOTE — Progress Notes (Signed)
Pharmacy Antibiotic Note  Joseph Carrillo is a 82 y.o. male with prostate cancer and suprapubic catheter, colostomy, recent hospitalization for UTI presents on 03/28/2016 with 3-4 days abd pain and decreased UOP.  Pharmacy has been consulted for vancomycin dosing for UTI; on Rocephin per MD. Per daughter, catheter changed monthly on the 11th. Also noted to have AoCKD and hyperkalemia; trying to avoid nephrotoxic medications.  Plan:  Vancomycin 750 mg IV now, then follow SCr daily and dose as appropriate. Dosing for CrCl 10-20 would be 500 mg q48 hr for UTI. Baseline kidney function unclear.  Rocephin 1g q24 hr per MD is appropriate     Temp (24hrs), Avg:97.9 F (36.6 C), Min:97.9 F (36.6 C), Max:97.9 F (36.6 C)   Recent Labs Lab 03/28/16 1007  WBC 15.0*  CREATININE 3.60*    Estimated Creatinine Clearance: 9.5 mL/min (by C-G formula based on SCr of 3.6 mg/dL).    No Known Allergies  Antimicrobials this admission: Rocephin 9/6 >>  Vancomycin 9/6 >>   Dose adjustments this admission: ---  Microbiology results: 9/6 UCx (chronic suprapubic cath): sent; UA shows many bact/WBC, NO negative  Thank you for allowing pharmacy to be a part of this patient's care.  Reuel Boom, PharmD, BCPS Pager: [phone] 03/28/2016, 3:08 PM

## 2016-03-28 NOTE — ED Provider Notes (Signed)
[facility] DEPT Provider Note   CSN: [id number] Arrival date & time: 03/28/16  0820     History   Chief Complaint Chief Complaint  Patient presents with  . Abdominal Pain  . Foot Pain    HPI Joseph Carrillo is a 82 y.o. male.  HPI Patient presents with his daughter who assists with the history of present illness. Patient presents with concern of discomfort about a suprapubic catheter site. He notes that over the past day he has had pain persistently in this area, and today, noticed decreased urine production. No change in the consistency of urine, which began to be produced soon after he awoke, was upright. No new fever, chills. Patient has a notable history of recent admission to a hospital in [place] for urinary tract infection, discharged to a nursing facility. One week ago he was discharged from that facility, now moved to this area, to reside with his daughter.   Past Medical History:  Diagnosis Date  . Blood in stool   . Blood transfusion without reported diagnosis   . Chicken pox   . Colon polyp   . Elevated blood pressure reading   . Glaucoma   . Heart murmur   . Hx: UTI (urinary tract infection)   . Hyperlipidemia   . Prostate cancer [facility])     Patient Active Problem List   Diagnosis Date Noted  . History of urostomy 03/22/2016  . Colostomy in place [facility]) 03/22/2016  . Underweight 03/22/2016  . Prostate cancer ([place]) 03/22/2016  . Abdominal pain, chronic, epigastric 03/22/2016  . Physical debility 03/22/2016    Past Surgical History:  Procedure Laterality Date  . CORONARY ARTERY BYPASS GRAFT    . LUNG SURGERY    . PROSTATE SURGERY         Home Medications    Prior to Admission medications   Medication Sig Start Date End Date Taking? Authorizing Provider  atorvastatin (LIPITOR) 20 MG tablet Take 20 mg by mouth daily.   Yes Historical Provider, MD  cloNIDine HCl (KAPVAY) 0.1 MG TB12 ER tablet Take 0.1 mg by mouth 2 (two) times daily.    Yes Historical Provider, MD  HYDROcodone-acetaminophen (NORCO/VICODIN) 5-325 MG tablet Take 1 tablet by mouth every 6 (six) hours as needed. 03/22/16  Yes Gay Filler Copland, MD  sodium bicarbonate 650 MG tablet Take 650 mg by mouth 4 (four) times daily.   Yes Historical Provider, MD  nitroGLYCERIN (NITROSTAT) 0.4 MG SL tablet Place 0.4 mg under the tongue every 5 (five) minutes as needed for chest pain.    Historical Provider, MD    Family History History reviewed. No pertinent family history.  Social History Social History  Substance Use Topics  . Smoking status: Former Research scientist (life sciences)  . Smokeless tobacco: Never Used  . Alcohol use No     Allergies   Review of patient's allergies indicates no known allergies.   Review of Systems Review of Systems  Constitutional:       Per HPI, otherwise negative  HENT:       Per HPI, otherwise negative  Respiratory:       Per HPI, otherwise negative  Cardiovascular:       Per HPI, otherwise negative  Gastrointestinal: Positive for abdominal pain. Negative for nausea and vomiting.  Endocrine:       Negative aside from HPI  Genitourinary:       Suprapubic catheter, as above, history of prostate cancer  Musculoskeletal:  Chronic pain right great toe, not currently present  Skin: Negative.   Neurological: Negative for syncope.     Physical Exam Updated Vital Signs BP (!) 159/50   Pulse 68   Temp 97.9 F (36.6 C) (Oral)   Resp 17   SpO2 99%   Physical Exam  Constitutional: He is oriented to person, place, and time. He has a sickly appearance. No distress.  HENT:  Head: Normocephalic and atraumatic.  Eyes: Conjunctivae and EOM are normal.  Cardiovascular: Normal rate and regular rhythm.   Pulmonary/Chest: Effort normal. No stridor. No respiratory distress.  Abdominal: He exhibits no distension.    Musculoskeletal: He exhibits no edema.  All toes with substantial toenail fungus, no evidence for cellulitis, no bleeding, drainage  on the feet.  Neurological: He is alert and oriented to person, place, and time.  Skin: Skin is warm and dry.  Psychiatric: He has a normal mood and affect.  Nursing note and vitals reviewed.    ED Treatments / Results  Labs (all labs ordered are listed, but only abnormal results are displayed) Labs Reviewed  COMPREHENSIVE METABOLIC PANEL - Abnormal; Notable for the following:       Result Value   Potassium >7.5 (*)    Chloride 115 (*)    CO2 19 (*)    BUN 87 (*)    Creatinine, Ser 3.60 (*)    Albumin 3.1 (*)    GFR calc non Af Amer 14 (*)    GFR calc Af Amer 16 (*)    All other components within normal limits  CBC WITH DIFFERENTIAL/PLATELET - Abnormal; Notable for the following:    WBC 15.0 (*)    RBC 3.37 (*)    Hemoglobin 9.3 (*)    HCT 30.1 (*)    RDW 17.3 (*)    Neutro Abs 12.8 (*)    Monocytes Absolute 1.1 (*)    All other components within normal limits  URINALYSIS, ROUTINE W REFLEX MICROSCOPIC (NOT AT [facility]) - Abnormal; Notable for the following:    APPearance TURBID (*)    Hgb urine dipstick LARGE (*)    Protein, ur >300 (*)    Leukocytes, UA LARGE (*)    All other components within normal limits  URINE MICROSCOPIC-ADD ON - Abnormal; Notable for the following:    Squamous Epithelial / LPF 0-5 (*)    Bacteria, UA MANY (*)    All other components within normal limits    EKG  EKG Interpretation  Date/Time:  Wednesday March 28 2016 12:27:16 EDT Ventricular Rate:  69 PR Interval:    QRS Duration: 97 QT Interval:  383 QTC Calculation: 411 R Axis:   -13 Text Interpretation:  Sinus rhythm Atrial premature complexes Borderline low voltage, extremity leads ST-t wave abnormality Baseline wander Abnormal ekg Confirmed by Carmin Muskrat  MD ([id number]) on 03/28/2016 12:31:57 PM       Radiology No results found.  Procedures Procedures (including critical care time)  Medications Ordered in ED Medications  calcium chloride 1 g in sodium chloride 0.9 % 100 mL  IVPB (1 g Intravenous New Bag/Given 03/28/16 1329)  albuterol (PROVENTIL,VENTOLIN) solution continuous neb (10 mg/hr Nebulization New Bag/Given 03/28/16 1253)  sodium polystyrene (KAYEXALATE) 15 GM/60ML suspension 30 g (not administered)  sodium chloride 0.9 % bolus 1,000 mL (1,000 mLs Intravenous New Bag/Given 03/28/16 1320)  dextrose 50 % solution 50 mL (50 mLs Intravenous Given 03/28/16 1324)  insulin aspart (novoLOG) injection 10 Units (10 Units Intravenous Given 03/28/16  1325)  sodium bicarbonate injection 50 mEq (50 mEq Intravenous Given 03/28/16 1326)     Initial Impression / Assessment and Plan / ED Course  I have reviewed the triage vital signs and the nursing notes.  Pertinent labs & imaging results that were available during my care of the patient were reviewed by me and considered in my medical decision making (see chart for details).  Clinical Course  Comment By Time  K >7.5, Cr >3.  IVF running, ECG pending. Carmin Muskrat, MD 09/06 1136    I discussed patient's case with our nephrologist, and our hospitalist. With concern for hyperkalemia, the patient will receive insulin, calcium, albuterol, insulin, dextrose, bicarbonate.  On repeat examination the patient returns in similar condition. Patient required admission to the step down unit.    Final Clinical Impressions(s) / ED Diagnoses  Elderly male presents with concern for weakness, abdominal discomfort, anorexia. Notably, the patient has recent hospitalization for urinary tract infection, has suprapubic catheter in place. On exam the patient is clinically dehydrated, but in no distress. However, the patient's son is critically potassium, evidence for renal dysfunction. Patient received empiric fluid resuscitation, and after he was found to have aforementioned critical abnormalities, required new medication, admission to the step down unit for further evaluation and management.  CRITICAL CARE Performed by: Carmin Muskrat Total critical care time: 40 minutes Critical care time was exclusive of separately billable procedures and treating other patients. Critical care was necessary to treat or prevent imminent or life-threatening deterioration. Critical care was time spent personally by me on the following activities: development of treatment plan with patient and/or surrogate as well as nursing, discussions with consultants, evaluation of patient's response to treatment, examination of patient, obtaining history from patient or surrogate, ordering and performing treatments and interventions, ordering and review of laboratory studies, ordering and review of radiographic studies, pulse oximetry and re-evaluation of patient's condition.    Carmin Muskrat, MD 03/28/16 1335

## 2016-03-28 NOTE — ED Notes (Signed)
Unsuccessful IV attempt x 2.  Matt RN asked to attempt.

## 2016-03-28 NOTE — ED Notes (Signed)
[facility] notified that the patient will be going to CT soon and writer will not be bringing up until after CT.

## 2016-03-29 ENCOUNTER — Encounter (HOSPITAL_COMMUNITY): Payer: Self-pay | Admitting: Nurse Practitioner

## 2016-03-29 ENCOUNTER — Inpatient Hospital Stay (HOSPITAL_COMMUNITY): Payer: Medicare Other

## 2016-03-29 DIAGNOSIS — D72829 Elevated white blood cell count, unspecified: Secondary | ICD-10-CM

## 2016-03-29 DIAGNOSIS — N179 Acute kidney failure, unspecified: Secondary | ICD-10-CM

## 2016-03-29 DIAGNOSIS — E875 Hyperkalemia: Secondary | ICD-10-CM

## 2016-03-29 DIAGNOSIS — N390 Urinary tract infection, site not specified: Secondary | ICD-10-CM

## 2016-03-29 DIAGNOSIS — N189 Chronic kidney disease, unspecified: Secondary | ICD-10-CM

## 2016-03-29 DIAGNOSIS — R7989 Other specified abnormal findings of blood chemistry: Secondary | ICD-10-CM

## 2016-03-29 DIAGNOSIS — Z9359 Other cystostomy status: Secondary | ICD-10-CM

## 2016-03-29 LAB — BASIC METABOLIC PANEL
ANION GAP: 7 (ref 5–15)
ANION GAP: 10 (ref 5–15)
Anion gap: 10 (ref 5–15)
Anion gap: 9 (ref 5–15)
Anion gap: 9 (ref 5–15)
BUN: 75 mg/dL — AB (ref 6–20)
BUN: 70 mg/dL — AB (ref 6–20)
BUN: 68 mg/dL — AB (ref 6–20)
BUN: 65 mg/dL — AB (ref 6–20)
BUN: 65 mg/dL — AB (ref 6–20)
CALCIUM: 9.5 mg/dL (ref 8.9–10.3)
CHLORIDE: 109 mmol/L (ref 101–111)
CO2: 20 mmol/L — AB (ref 22–32)
CO2: 19 mmol/L — AB (ref 22–32)
CO2: 22 mmol/L (ref 22–32)
CO2: 23 mmol/L (ref 22–32)
CO2: 20 mmol/L — ABNORMAL LOW (ref 22–32)
CREATININE: 3.71 mg/dL — AB (ref 0.61–1.24)
CREATININE: 3.42 mg/dL — AB (ref 0.61–1.24)
CREATININE: 3.45 mg/dL — AB (ref 0.61–1.24)
CREATININE: 3.35 mg/dL — AB (ref 0.61–1.24)
Calcium: 9.6 mg/dL (ref 8.9–10.3)
Calcium: 9.7 mg/dL (ref 8.9–10.3)
Calcium: 9.4 mg/dL (ref 8.9–10.3)
Calcium: 8.6 mg/dL — ABNORMAL LOW (ref 8.9–10.3)
Chloride: 116 mmol/L — ABNORMAL HIGH (ref 101–111)
Chloride: 113 mmol/L — ABNORMAL HIGH (ref 101–111)
Chloride: 112 mmol/L — ABNORMAL HIGH (ref 101–111)
Chloride: 111 mmol/L (ref 101–111)
Creatinine, Ser: 3.51 mg/dL — ABNORMAL HIGH (ref 0.61–1.24)
GFR calc Af Amer: 16 mL/min — ABNORMAL LOW (ref >60)
GFR calc Af Amer: 17 mL/min — ABNORMAL LOW (ref >60)
GFR calc Af Amer: 17 mL/min — ABNORMAL LOW (ref >60)
GFR calc Af Amer: 17 mL/min — ABNORMAL LOW (ref >60)
GFR calc Af Amer: 18 mL/min — ABNORMAL LOW (ref >60)
GFR calc non Af Amer: 14 mL/min — ABNORMAL LOW (ref >60)
GFR, EST NON AFRICAN AMERICAN: 14 mL/min — AB (ref >60)
GFR, EST NON AFRICAN AMERICAN: 15 mL/min — AB (ref >60)
GFR, EST NON AFRICAN AMERICAN: 15 mL/min — AB (ref >60)
GFR, EST NON AFRICAN AMERICAN: 15 mL/min — AB (ref >60)
GLUCOSE: 122 mg/dL — AB (ref 65–99)
GLUCOSE: 80 mg/dL (ref 65–99)
GLUCOSE: 116 mg/dL — AB (ref 65–99)
GLUCOSE: 166 mg/dL — AB (ref 65–99)
GLUCOSE: 166 mg/dL — AB (ref 65–99)
POTASSIUM: 6 mmol/L — AB (ref 3.5–5.1)
POTASSIUM: 5.3 mmol/L — AB (ref 3.5–5.1)
POTASSIUM: 4.7 mmol/L (ref 3.5–5.1)
Potassium: 6.2 mmol/L — ABNORMAL HIGH (ref 3.5–5.1)
Potassium: 5.9 mmol/L — ABNORMAL HIGH (ref 3.5–5.1)
SODIUM: 144 mmol/L (ref 135–145)
SODIUM: 143 mmol/L (ref 135–145)
SODIUM: 138 mmol/L (ref 135–145)
Sodium: 143 mmol/L (ref 135–145)
Sodium: 142 mmol/L (ref 135–145)

## 2016-03-29 LAB — CBC WITH DIFFERENTIAL/PLATELET
BASOS PCT: 0 %
Basophils Absolute: 0 10*3/uL (ref 0.0–0.1)
EOS ABS: 0.1 10*3/uL (ref 0.0–0.7)
EOS PCT: 1 %
HCT: 33.7 % — ABNORMAL LOW (ref 39.0–52.0)
HEMOGLOBIN: 10.2 g/dL — AB (ref 13.0–17.0)
Lymphocytes Relative: 7 %
Lymphs Abs: 1 10*3/uL (ref 0.7–4.0)
MCH: 27.6 pg (ref 26.0–34.0)
MCHC: 30.3 g/dL (ref 30.0–36.0)
MCV: 91.3 fL (ref 78.0–100.0)
MONO ABS: 0.7 10*3/uL (ref 0.1–1.0)
MONOS PCT: 5 %
NEUTROS PCT: 87 %
Neutro Abs: 11.6 10*3/uL — ABNORMAL HIGH (ref 1.7–7.7)
PLATELETS: 193 10*3/uL (ref 150–400)
RBC: 3.69 MIL/uL — ABNORMAL LOW (ref 4.22–5.81)
RDW: 17 % — AB (ref 11.5–15.5)
WBC: 13.4 10*3/uL — ABNORMAL HIGH (ref 4.0–10.5)

## 2016-03-29 LAB — TROPONIN I: TROPONIN I: 0.04 ng/mL — AB (ref <0.03)

## 2016-03-29 LAB — URINE CULTURE

## 2016-03-29 LAB — HEPARIN LEVEL (UNFRACTIONATED): HEPARIN UNFRACTIONATED: 0.11 [IU]/mL — AB (ref 0.30–0.70)

## 2016-03-29 LAB — LIPID PANEL
CHOL/HDL RATIO: 1.6 ratio
CHOLESTEROL: 87 mg/dL (ref 0–200)
HDL: 53 mg/dL (ref >40)
LDL Cholesterol: 27 mg/dL (ref 0–99)
Triglycerides: 36 mg/dL (ref <150)
VLDL: 7 mg/dL (ref 0–40)

## 2016-03-29 LAB — MAGNESIUM: Magnesium: 1.5 mg/dL — ABNORMAL LOW (ref 1.7–2.4)

## 2016-03-29 MED ORDER — INSULIN ASPART 100 UNIT/ML IV SOLN
10.0000 [IU] | Freq: Once | INTRAVENOUS | Status: AC
  Administered 2016-03-29: 10 [IU] via INTRAVENOUS
  Filled 2016-03-29: qty 0.1

## 2016-03-29 MED ORDER — HEPARIN SODIUM (PORCINE) 5000 UNIT/ML IJ SOLN
5000.0000 [IU] | Freq: Three times a day (TID) | INTRAMUSCULAR | Status: DC
  Administered 2016-03-29 – 2016-03-31 (×7): 5000 [IU] via SUBCUTANEOUS
  Filled 2016-03-29 (×7): qty 1

## 2016-03-29 MED ORDER — DEXTROSE 50 % IV SOLN
1.0000 | Freq: Once | INTRAVENOUS | Status: AC
  Administered 2016-03-29: 50 mL via INTRAVENOUS
  Filled 2016-03-29: qty 50

## 2016-03-29 MED ORDER — SODIUM CHLORIDE 0.9 % IV SOLN
1.0000 g | Freq: Once | INTRAVENOUS | Status: AC
  Administered 2016-03-29: 1 g via INTRAVENOUS
  Filled 2016-03-29: qty 10

## 2016-03-29 MED ORDER — MAGNESIUM SULFATE 2 GM/50ML IV SOLN
2.0000 g | Freq: Once | INTRAVENOUS | Status: AC
  Administered 2016-03-29: 2 g via INTRAVENOUS
  Filled 2016-03-29: qty 50

## 2016-03-29 MED ORDER — SODIUM POLYSTYRENE SULFONATE 15 GM/60ML PO SUSP
60.0000 g | Freq: Once | ORAL | Status: AC
  Administered 2016-03-29: 60 g via ORAL
  Filled 2016-03-29: qty 240

## 2016-03-29 MED ORDER — ORAL CARE MOUTH RINSE
15.0000 mL | Freq: Two times a day (BID) | OROMUCOSAL | Status: DC
  Administered 2016-03-30 – 2016-04-04 (×6): 15 mL via OROMUCOSAL

## 2016-03-29 MED ORDER — BOOST / RESOURCE BREEZE PO LIQD
1.0000 | Freq: Three times a day (TID) | ORAL | Status: DC
  Administered 2016-03-29 – 2016-04-05 (×10): 1 via ORAL

## 2016-03-29 MED ORDER — HEPARIN (PORCINE) IN NACL 100-0.45 UNIT/ML-% IJ SOLN: 750.0000 [IU]/h | INTRAMUSCULAR | Status: DC

## 2016-03-29 NOTE — Consult Note (Signed)
Cardiology Consult    Patient ID: Joseph Carrillo MRN: [id number], DOB/AGE: 1926-10-17   Admit date: 03/28/2016 Date of Consult: 03/29/2016  Primary Physician: [name], MD Primary Cardiologist: New  Requesting Provider: C. Sherral Hammers, MD  Patient Profile    82 y/o ? with a h/o CAD, valvular heart dzs, HL, prostate CA, and recent prolonged hospitalization in the setting of UTI, who was admitted 9/6 with weakness, hyperkalemia, and renal failure.    Past Medical History   Past Medical History:  Diagnosis Date  . Blood in stool   . Blood transfusion without reported diagnosis   . CAD (coronary artery disease)    a. 2000 - Says he had CABG x 3 or 4.  . Chicken pox   . Colon polyp   . Elevated blood pressure reading   . Glaucoma   . Heart murmur   . Hx: UTI (urinary tract infection)   . Hyperlipidemia   . Prostate cancer ([place])   . Valvular heart disease    a. 2005 s/p Valve replacement - presumably bioprosthetic (not on anticoagulation).  He doesn't know which valve.    Past Surgical History:  Procedure Laterality Date  . CORONARY ARTERY BYPASS GRAFT    . LUNG SURGERY    . PROSTATE SURGERY       Allergies  No Known Allergies  History of Present Illness    82 y/o ? 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  He was recently hospitalized in [place], [place] related to UTI.  He says that he had several hospital visits over the summer related to this, however following the most recent, he was d/c'd to rehab.  He says that he was in either the hosp or rehab for about the past month.  Following d/c from rehab, he came to the [place] area to live with his  sister.  He says that he has been feeling weak and has noted reduced UO with dark and smelly urine.  For those reasons, he presented to [facility] on the evening of 9/6.  There, he was found to be hyperkalemic with a K of >7.5 and creat of 3.60.  In the setting of hyperkalemia, there was concern for ST elevation (peaked T's noted), though this was not felt to represent a STEMI.  Pt was not having c/p but did c/o left arm/shoulder/bicep aching.  CT of the abd/pelvis showed bilat hydroureteronephrosis (R>L).  He was tx to [facility] and seen by urology.  In the setting of above, his troponin is mildly elevated @ 0.04.  We have been asked to eval.  He currently denies chest/arm pain, or dyspnea.  Inpatient Medications    . atorvastatin  20 mg Oral Daily  . cefTRIAXone (ROCEPHIN)  IV  1 g Intravenous Q24H  . cloNIDine HCl  0.1 mg Oral BID  . metoprolol tartrate  12.5 mg Oral BID  . sodium chloride flush  3 mL Intravenous Q12H    Family History    Family History  Problem Relation Age of Onset  . Heart disease Father     Pt says his brother had heart infection and died suddenly.    Social History    Social History  Social History  . Marital status: Married    Spouse name: N/A  . Number of children: N/A  . Years of education: N/A   Occupational History  . Not on file.   Social History Main Topics  . Smoking status: Former Smoker    Quit date: 07/24/1971  . Smokeless tobacco: Never Used  . Alcohol use No     Comment: prev drank.  quit 1973 or 1974.  . Drug use: No  . Sexual activity: Not on file   Other Topics Concern  . Not on file   Social History Narrative   Lives in [place], [place] area by himself but is currently staying with his sister in [place] area after prolonged hospitalization this summer.     Review of Systems    General:  +++ Generalized malaise.  No chills, fever, night sweats or weight changes.  Cardiovascular:  No chest pain, dyspnea on exertion, edema, orthopnea,  palpitations, paroxysmal nocturnal dyspnea. Dermatological: No rash, lesions/masses Respiratory: No cough, dyspnea Urologic: No hematuria, dysuria Abdominal:   +++ Anorexia.  No nausea, vomiting, diarrhea, bright red blood per rectum, melena, or hematemesis Neurologic:  +++ generalized wkns.  No visual changes, changes in mental status. All other systems reviewed and are otherwise negative except as noted above.  Physical Exam    Blood pressure (!) 148/78, pulse 97, temperature 97.5 F (36.4 C), temperature source Oral, resp. rate 17, height 5\' 8"  (1.727 m), weight 110 lb 11.2 oz (50.2 kg), SpO2 100 %.  General: Pleasant, NAD Psych: Normal affect. Neuro: Alert and oriented X 3. Moves all extremities spontaneously. HEENT: Normal except poor dentition.  Neck: Supple without bruits or JVD. Lungs:  Resp regular and unlabored, diminished breath sounds bilat. Heart: RRR no s3, s4, 2/6 syst murmur loudest @ LLSB. Abdomen: Soft, non-tender, non-distended, BS + x 4.  Extremities: No clubbing, cyanosis or edema. DP/PT/Radials 2+ and equal bilaterally.  Labs     Recent Labs  03/28/16 2156 03/29/16 0212  TROPONINI 0.03* 0.04*   Lab Results  Component Value Date   WBC 13.4 (H) 03/29/2016   HGB 10.2 (L) 03/29/2016   HCT 33.7 (L) 03/29/2016   MCV 91.3 03/29/2016   PLT 193 03/29/2016    Recent Labs Lab 03/28/16 1007  03/29/16 1113  NA 140  < > 144  K >7.5*  < > 5.9*  CL 115*  < > 112*  CO2 19*  < > 22  BUN 87*  < > 68*  CREATININE 3.60*  < > 3.42*  CALCIUM 9.5  < > 9.7  PROT 7.2  --   --   BILITOT 0.9  --   --   ALKPHOS 61  --   --   ALT 62  --   --   AST 38  --   --   GLUCOSE 90  < > 116*  < > = values in this interval not displayed. Lab Results  Component Value Date   CHOL 87 03/29/2016   HDL 53 03/29/2016   LDLCALC 27 03/29/2016   TRIG 36 03/29/2016    Radiology Studies    Ct Abdomen Pelvis Wo Contrast  Result Date: 03/28/2016 CLINICAL DATA:  "Patient presents  with his daughter who assists with the history of present illness.Patient presents with concern of discomfort about a suprapubic catheter site.He notes that over the past day he has had pain persistently in this area, and today, noticed decreased urine production.No change in the consistency of urine,  which began to be produced soon after he awoke, was upright.No new fever, chills.Patient has a notable history of recent admission to a hospital in [place] for urinary tract infection, discharged to a nursing facility.One week ago he was discharged from that facility, now moved to this area, to reside with his daughter." EXAM: CT ABDOMEN AND PELVIS WITHOUT CONTRAST TECHNIQUE: Multidetector CT imaging of the abdomen and pelvis was performed following the standard protocol without IV contrast. COMPARISON:  None. FINDINGS: Lower chest: In the left lower lobe there is a 10 x 7 mm nodule (8.5 mm mean diameter) on image 4 of series 6. Fairly dense 6 mm nodule lies in the posterior right middle lobe, image 4, series 6. Minor subsegmental atelectasis at the posterior lung bases. Heart normal in size. Hepatobiliary: Normal liver. Dependent density in the gallstone consistent with small layering stones. No wall thickening or evidence of acute cholecystitis. No bile duct dilation. Pancreas: Unremarkable. Spleen: Small calcifications consistent with healed granuloma. Otherwise unremarkable. Adrenals/Urinary Tract: No adrenal masses. Moderate right and mild to moderate left hydronephrosis. No renal masses. No intrarenal stones. Moderate hydroureter, right somewhat greater than left. No ureteral stone. Bladder is mostly decompressed with a suprapubic catheter. Bladder is elevated by an enlarged prostate. Stomach/Bowel: Stomach and small bowel are unremarkable. There is an ileostomy in the right mid abdomen. Visualize colon is decompressed. No bowel obstruction. No bowel wall thickening or inflammation. Vascular/Lymphatic:  Diffuse aortic ectasia. Maximum AP diameter of the infrarenal aorta is 2.5 cm. There is atherosclerotic calcification along the aorta and its branch vessels. No enlarged lymph nodes. Reproductive: Prostate gland is enlarged measuring 5.0 x 4.9 x 5.5 cm. Other: No ascites.  No abdominal wall mass or collection. Musculoskeletal: Degenerative changes noted of the lumbar spine. Arthropathic changes noted of the hips mostly on the right. Bones are demineralized. No osteoblastic or discrete osteolytic lesions. IMPRESSION: 1. Bilateral hydroureteronephrosis. No ureteral stone. Bladder is mostly decompressed with a suprapubic Foley catheter, well positioned. 2. Enlarged prostate gland. 3. Gallstones without evidence of acute cholecystitis. 4. Aortic atherosclerosis. Aortic ectasia. Ectatic abdominal aorta at risk for aneurysm development. Recommend followup by ultrasound in 5 years. This recommendation follows ACR consensus guidelines: White Paper of the ACR Incidental Findings Committee II on Vascular Findings. J Am Coll Radiol 2013; 10:789-794. 5. Lung base nodules, largest with a mean diameter of 8.5 mm. Non-contrast chest CT at 3-6 months is recommended. If the nodules are stable at time of repeat CT, then future CT at 18-24 months (from today's scan) is considered optional for low-risk patients, but is recommended for high-risk patients. This recommendation follows the consensus statement: Guidelines for Management of Incidental Pulmonary Nodules Detected on CT Images:From the Fleischner Society 2017; published online before print (10.1148/radiol.SG:5268862). Electronically Signed   By: Lajean Manes M.D.   On: 03/28/2016 15:31   US Renal  Result Date: 03/29/2016 CLINICAL DATA:  Acute on chronic renal failure EXAM: RENAL / URINARY TRACT ULTRASOUND COMPLETE COMPARISON:  03/28/2016 FINDINGS: Right Kidney: Length: 9.9 cm.  Mild to moderate hydronephrosis is noted. Left Kidney: Length: 9.5 cm. Mild to moderate  hydronephrosis is noted slightly less than that on the right. Bladder: Decompressed by suprapubic catheter IMPRESSION: Mild to moderate hydronephrosis bilaterally right slightly greater than left. This is stable from the recent CT. Electronically Signed   By: Inez Catalina M.D.   On: 03/29/2016 14:24   ECG & Cardiac Imaging    RSR, 69, leftward axis, anterior ST elev most  pronounced in V2, in the setting of peaked T's in anterior leads.  Assessment & Plan    1.  Hyperkalemia:  In the setting of renal failure - ? Acute on chronic with bilateral hydroureteronephrosis.  K slowly coming down.  5.9 this morning.  Ongoing treatment per IM.  2.  Acute renal failure (likely on CKD):  Outside records pending re: CKD.  Urology following in setting of bilat hydroureteronephrosis.    3.  Elevated troponin/CAD:  Pt reports prior CABG in 2000.  He reported left arm/shoulder/bicep pain on admission.  ECG notable primarily for changes consistent with hyperkalemia.  Troponin minimally elevated in setting of renal failure - only 0.04.  He says that he was doing well from a cardiac standpoint since his valve surgery in ~ 2005.  Reasonable to check echo but there is no clear indication for an ischemic evaluation at this time.  Cont  blocker and statin.  D/c heparin.  4.  HTN:  Cont  blocker.  5.  HL:  LDL 27.  On statin.  6.  UTI:  abx per IM.  Signed, Murray Hodgkins, NP 03/29/2016, 2:35 PM    I have personally seen and examined this patient with Ignacia Bayley, NP. I agree with the assessment and plan as outlined above. He is known to have CAD with prior CABG. We have no details of this as his cardiac care has been in [facility]. Admitted with dehydration, weakness and hyperkalemia. Initial EKG with peaked T waves in V2. No chest pain. Troponin with very minimal elevation in setting of dehydration. Exam with thin elderly male in NAD. CV:RRR Lungs:clear Abd: soft, NT. Ext:no edema. Labs reviewed by me. EKG reviewed by  me.  I would not cycle troponins. This does not appear to be ACS. AGree with echo. Would stop heparin.   Lauree Chandler 03/29/2016 3:16 PM

## 2016-03-29 NOTE — Care Management Note (Signed)
Case Management Note  Patient Details  Name: Joseph Carrillo MRN: [id number] Date of Birth: 04/28/1927  Subjective/Objective:                    Action/Plan:   Expected Discharge Date:   (unknown)               Expected Discharge Plan:  [place]  In-House Referral:     Discharge planning Services  CM Consult  Post Acute Care Choice:  Resumption of Svcs/PTA Provider Choice offered to:     DME Arranged:    DME Agency:     HH Arranged:  RN [place] Agency:  [facility]  Status of Service:  In process, will continue to follow  If discussed at Long Length of Stay Meetings, dates discussed:    Additional Comments:  Lacretia Leigh, RN 03/29/2016, 9:54 AM

## 2016-03-29 NOTE — Consult Note (Signed)
Urology Consult Note   Requesting Attending Physician:  Allie Bossier, MD Service Providing Consult: Urology Consulting Attending: Matilde Sprang, MD  Assessment:  Patient is a 82 y.o. male with history of CAD s/p CABG, valvular surgery, HLD, prostate cancer s/p radiation (?), enlarged prostate, enterovesicular fistula (?) s/p colostomy formation and suprapubic catheter placement. He does have a history of CKD with unknown renal function baseline, required dialysis once in the past. Required admission in [facility] about 1 month ago for UTI. Urology was consulted for assistance with bilateral hydroureteronephrosis (R>L), elevated Cr with unknown baseline.  Interval: No fever, tachycardic to 110s now resolving this morning to 75-95. Hypertensive. Left shoulder pain overnight, EKG showing ST elevation. Negative troponins. 420cc SPT UOP overnight. 1.8L stool via ostomy since admission. Cr fluctuating between 3.5-3.71. Urine culture pending. K this morning 6.2. Started on hep gtt overnight and transferred to [facility] cardiac ICU. Treated with vanc/CTX.  Recommendations: 1. Continue to closely monitor Cr at least daily 2. Please make attempts to find recent OSH documentation and records, specifically baseline renal function and any other pertinent PMH. Patient may require PCN decompression of the kidneys bilaterally if his renal function is significantly elevated above baseline but query whether his hydronephrosis and CKD is chronic in nature. Would hesitate to place PCNs bilaterally unless records show he is well above baseline and/or felt to be medically necessary due to renal failure or sepsis 3. Suprapubic catheter will need to be exchanged in next few days if he continues to be inpatient - 20Fr catheter with 30cc balloon. Can be performed by RN staff. Last changed about 3 weeks ago. Needs 4 week changes. 4. Continue to use large standing gravity bag to drain suprapubic catheter while lying in hospital bed,  today found to have leg bag attached which will not adequately drain bladder 5. Follow up urine culture from 9/6, continue IVF resuscitation  Thank you for this consult. Please contact the urology consult pager with any further questions/concerns. Sharmaine Base, MD Urology Surgical Resident   Subjective: Had left shoulder pain overnight that felt like someone punched him in the shoulder, radiated down left side. ST elevation on EKG. No significant back pain, flank pain. Shoulder pain resolved. No fevers, chills, nausea, emesis. States he noticed decreased UOP via SPT over last month, darker and more concentrated urine with increasing ostomy output.    Objective   Vital signs in last 24 hours: BP (!) 162/61 (BP Location: Left Arm)   Pulse 79   Temp 97.9 F (36.6 C) (Oral)   Resp 17   Ht 5\' 8"  (1.727 m)   Wt 110 lb 11.2 oz (50.2 kg)   SpO2 100%   BMI 16.83 kg/m   Intake/Output last 3 shifts: I/O last 3 completed shifts: In: 3220.4 [I.V.:1950.4; IV Piggyback:1270] Out: 2445 [Urine:620; Stool:1825]  Physical Exam General: NAD, A&O, resting, appropriate, thin elderly AAM HEENT: [place]/AT, EOMI, MMM, nasal cannula in place Pulmonary: Normal work of breathing Cardiovascular: Regular rate & rhythm, HDS, adequate peripheral perfusion Abdomen: soft, NTTP, nondistended, suprapubic catheter draining yellow urine, LLQ ostomy draining liquid thin brown stool GU: SPT as above, no CVA tenderness or flank tenderness bilaterally Extremities: warm and well perfused, no edema Neuro: Appropriate, no focal neurological deficits  Most Recent Labs: Lab Results  Component Value Date   WBC 15.0 (H) 03/28/2016   HGB 9.3 (L) 03/28/2016   HCT 30.1 (L) 03/28/2016   PLT 188 03/28/2016    Lab Results  Component Value Date  NA 142 03/29/2016   K 6.0 (H) 03/29/2016   CL 113 (H) 03/29/2016   CO2 19 (L) 03/29/2016   BUN 70 (H) 03/29/2016   CREATININE 3.51 (H) 03/29/2016   CALCIUM 9.6 03/29/2016     Lab Results  Component Value Date   ALKPHOS 61 03/28/2016   BILITOT 0.9 03/28/2016   PROT 7.2 03/28/2016   ALBUMIN 3.1 (L) 03/28/2016   ALT 62 03/28/2016   AST 38 03/28/2016    Lab Results  Component Value Date   INR 1.09 03/28/2016   APTT 42 (H) 03/28/2016     Urine culture: Pending   IMAGING: Ct Abdomen Pelvis Wo Contrast  Result Date: 03/28/2016 CLINICAL DATA:  "Patient presents with his daughter who assists with the history of present illness.Patient presents with concern of discomfort about a suprapubic catheter site.He notes that over the past day he has had pain persistently in this area, and today, noticed decreased urine production.No change in the consistency of urine, which began to be produced soon after he awoke, was upright.No new fever, chills.Patient has a notable history of recent admission to a hospital in [place] for urinary tract infection, discharged to a nursing facility.One week ago he was discharged from that facility, now moved to this area, to reside with his daughter." EXAM: CT ABDOMEN AND PELVIS WITHOUT CONTRAST TECHNIQUE: Multidetector CT imaging of the abdomen and pelvis was performed following the standard protocol without IV contrast. COMPARISON:  None. FINDINGS: Lower chest: In the left lower lobe there is a 10 x 7 mm nodule (8.5 mm mean diameter) on image 4 of series 6. Fairly dense 6 mm nodule lies in the posterior right middle lobe, image 4, series 6. Minor subsegmental atelectasis at the posterior lung bases. Heart normal in size. Hepatobiliary: Normal liver. Dependent density in the gallstone consistent with small layering stones. No wall thickening or evidence of acute cholecystitis. No bile duct dilation. Pancreas: Unremarkable. Spleen: Small calcifications consistent with healed granuloma. Otherwise unremarkable. Adrenals/Urinary Tract: No adrenal masses. Moderate right and mild to moderate left hydronephrosis. No renal masses. No intrarenal  stones. Moderate hydroureter, right somewhat greater than left. No ureteral stone. Bladder is mostly decompressed with a suprapubic catheter. Bladder is elevated by an enlarged prostate. Stomach/Bowel: Stomach and small bowel are unremarkable. There is an ileostomy in the right mid abdomen. Visualize colon is decompressed. No bowel obstruction. No bowel wall thickening or inflammation. Vascular/Lymphatic: Diffuse aortic ectasia. Maximum AP diameter of the infrarenal aorta is 2.5 cm. There is atherosclerotic calcification along the aorta and its branch vessels. No enlarged lymph nodes. Reproductive: Prostate gland is enlarged measuring 5.0 x 4.9 x 5.5 cm. Other: No ascites.  No abdominal wall mass or collection. Musculoskeletal: Degenerative changes noted of the lumbar spine. Arthropathic changes noted of the hips mostly on the right. Bones are demineralized. No osteoblastic or discrete osteolytic lesions. IMPRESSION: 1. Bilateral hydroureteronephrosis. No ureteral stone. Bladder is mostly decompressed with a suprapubic Foley catheter, well positioned. 2. Enlarged prostate gland. 3. Gallstones without evidence of acute cholecystitis. 4. Aortic atherosclerosis. Aortic ectasia. Ectatic abdominal aorta at risk for aneurysm development. Recommend followup by ultrasound in 5 years. This recommendation follows ACR consensus guidelines: White Paper of the ACR Incidental Findings Committee II on Vascular Findings. J Am Coll Radiol 2013; 10:789-794. 5. Lung base nodules, largest with a mean diameter of 8.5 mm. Non-contrast chest CT at 3-6 months is recommended. If the nodules are stable at time of repeat CT, then future CT  at 18-24 months (from today's scan) is considered optional for low-risk patients, but is recommended for high-risk patients. This recommendation follows the consensus statement: Guidelines for Management of Incidental Pulmonary Nodules Detected on CT Images:From the Fleischner Society 2017; published online  before print (10.1148/radiol.SG:5268862). Electronically Signed   By: Lajean Manes M.D.   On: 03/28/2016 15:31

## 2016-03-29 NOTE — Progress Notes (Signed)
Medical release form faxed to [facility] in [place]. Office visit record from PCP found under Care Everywhere. [name], [facility]

## 2016-03-29 NOTE — Progress Notes (Signed)
PROGRESS NOTE    Joseph Carrillo  [id number] DOB: 1927/05/20 DOA: 03/28/2016 PCP: [name], MD   Brief Narrative:  82 y.o. BM PMHx S/P Valve surgery at [facility] in early 2000s and CABG in 2001 Suprapubic catheter due to Prostate Cancer, Colostomy, recent hospitalization for UTI,   Who presents [place] with 3-4 days of abdominal pain and decreased urine output.  Patient was admitted to hospital in [place] for urinary tract infection and discharged to nursing facility for rehabilitation. He was discharged from nursing facility about a week ago and moved to [place] to live with his daughter.   Patient described the abdominal pain as stabbing and constant. He reports decreased urine output when lying but noted urine coming into a urine back when he stood. He denies blood in urine or ostomy bag. Daughter stated that patient gets catheter changed on the 11th of every month. Patient said that he was told he had history of kidney disease. Daughter thinks that patient received one session of hemodialysis in the past. He denies fever, chills or new back pain. Denies nausea, vomiting and diarrhea. Denies chest pain or shortness of breath.   ED Course: Vital signs within normal limits. CMP significant for potassium greater than 7.5, chloride 15, bicarbonate 19, BUN 87, creatinine 3.6. CBC remarkable for WBC 15, hemoglobin 9.3, hematocrit 30.1. Urinalysis remarkable for many bacteria, large hemoglobin, large leukocytes  and protein greater than 300. EKG with mildly peaked T waves in anterior leads. Received calcium chloride 1 g, dextrose 50%, insulin 10 units IV, sodium bicarbonate x2, Kayexalate, albuterol and a liter of normal saline bolus. Nephrology was consulted over the phone.    Subjective: 9/7 A/O 4 but significant gaps in patients memory concerning his health care i.e. does not know what valve was replaced/repaired. States a Dr. Merrilyn Puma replaced/repaired heart valve. A Dr. Juanda Crumble  performed resection with colostomy. 1997 prostatectomy + XRT performed in [place]:   Active Problems:   Colostomy in place [facility])   Prostate cancer ([place])   Physical debility   Hyperkalemia   CKD (chronic kidney disease)   Suprapubic catheter (HCC)   UTI (urinary tract infection)   Leukocytosis   Anemia   Hyperkalemia:  Status post calcium chloride 1, dextrose, insulin, albuterol, bicarbonate and Kayexalate in ED -Telemetry -BMP every now and every 4 hours -Continue albuterol nebulizer -Consider hemodialysis if no improvement with the above measures -Repeat Kayexalate 60 gm x 1 -Repeat CBC/BMP/Mg 1300: Potassium continues to trend down.  Hypomagnesemia -Magnesium IV 2 gm  CKD-4:  -Creatinine 3.6. BUN 87. No prior to compare to. He moved here from  [facility]. No chest pain or AMS or bleeding -Avoid nephrotoxic drugs -Repeat BMP  UTI/Hydronephrosis/ colonization:  -Patient with suprapubic catheter. UA with many bacteria, large leukocyte estrace, large hemoglobin and protienuria >300. CBC significant for leukocytosis to 15.  -Continue current antibiotic await cultures and susceptibility. Treat as complicated UTI -Urine for urine culture pending -CT abdomen/pelvis w/o contrast: Hydronephrosis see results below  Lung nodules -Multiple lung nodules see results below -Metastasis? Currently nodules not pathologic in size, consider [facility] consult   Goals of care -Have acquired consent from patient to contact multiple medical facilities for his records to include Dr. Merrilyn Puma cardiologist/cardiothoracic surgeon, Dr. Juanda Crumble general surgery, [facility]   DVT prophylaxis: subcutaneous heparin Code Status: Full Family Communication: Daughter present Disposition Plan: ?   Consultants:  Dr.Christopher D Angelena [facility]   Procedures/Significant Events:  9/6  CT abdomen and pelvis W Wo contrast:-Bilateral hydroureteronephrosis. No ureteral  stone.  -Enlarged prostate gland. -Gallstones without evidence of acute cholecystitis. -Lung base nodules, largest with a mean diameter of 8.5 mm. 9/7 renal ultrasound:Mild to moderate hydronephrosis bilaterally right slightly greater than left. This is stable from the recent CT.  Cultures 9/6 urine positive multiple species 9/7 urine pending   Antimicrobials: Ceftriaxone 9/6>> Vancomycin 9/61 dose    Devices    LINES / TUBES:  Suprapubic cath    Continuous Infusions: . albuterol 10 mg/hr (03/28/16 1253)  . heparin 750 Units/hr (03/29/16 0906)  . nitroGLYCERIN Stopped (03/28/16 2254)  .  sodium bicarbonate  infusion 1000 mL 125 mL/hr at 03/29/16 0118     Objective: Vitals:   03/29/16 0400 03/29/16 0500 03/29/16 0615 03/29/16 0751  BP: (!) 165/53 (!) 144/50 (!) 163/56 (!) 162/61  Pulse: 80 86 74 79  Resp: 14 12 12 17   Temp:   98.1 F (36.7 C) 97.9 F (36.6 C)  TempSrc:   Oral Oral  SpO2: 99% 100% 100% 100%  Weight:   50.2 kg (110 lb 11.2 oz)   Height:   5\' 8"  (1.727 m)     Intake/Output Summary (Last 24 hours) at 03/29/16 0919 Last data filed at 03/29/16 [id number]  Gross per 24 hour  Intake          3220.35 ml  Output             2445 ml  Net           775.35 ml   Filed Weights   03/28/16 1600 03/29/16 0615  Weight: 51.6 kg (113 lb 12.1 oz) 50.2 kg (110 lb 11.2 oz)    Examination:  General:A/O 4 but significant gaps in patients memory concerning his health care, No acute respiratory distress, cachectic Eyes: negative scleral hemorrhage, negative anisocoria, negative icterus ENT: Negative Runny nose, negative gingival bleeding, Neck:  Negative scars, masses, torticollis, lymphadenopathy, JVD Lungs: Clear to auscultation bilaterally without wheezes or crackles Cardiovascular: Regular rate and rhythm without murmur gallop or rub normal S1 and S2 Abdomen: negative abdominal pain, nondistended, positive soft, bowel sounds, no rebound, no ascites, no  appreciable mass Extremities: No significant cyanosis, clubbing, or edema bilateral lower extremities Skin: Negative rashes, lesions, ulcers Psychiatric:  Negative depression, negative anxiety, negative fatigue, negative mania  Central nervous system:  Cranial nerves II through XII intact, tongue/uvula midline, all extremities muscle strength 5/5, sensation intact throughout, negative dysarthria, negative expressive aphasia, negative receptive aphasia.  .     Data Reviewed: Care during the described time interval was provided by me .  I have reviewed this patient's available data, including medical history, events of note, physical examination, and all test results as part of my evaluation. I have personally reviewed and interpreted all radiology studies.  CBC:  Recent Labs Lab 03/28/16 1007  WBC 15.0*  NEUTROABS 12.8*  HGB 9.3*  HCT 30.1*  MCV 89.3  PLT 0000000   Basic Metabolic Panel:  Recent Labs Lab 03/28/16 1007 03/28/16 1808 03/28/16 2156 03/29/16 0212 03/29/16 0659  NA 140 142 142 143 142  K >7.5* 7.2* 5.8* 6.2* 6.0*  CL 115* 118* 114* 116* 113*  CO2 19* 13* 19* 20* 19*  GLUCOSE 90 206* 153* 122* 80  BUN 87* 79* 79* 75* 70*  CREATININE 3.60* 3.45* 3.50* 3.71* 3.51*  CALCIUM 9.5 9.9 9.7 9.5 9.6   GFR: Estimated Creatinine Clearance: 10.7 mL/min (by C-G formula based on SCr of  3.51 mg/dL). Liver Function Tests:  Recent Labs Lab 03/28/16 1007  AST 38  ALT 62  ALKPHOS 61  BILITOT 0.9  PROT 7.2  ALBUMIN 3.1*   No results for input(s): LIPASE, AMYLASE in the last 168 hours. No results for input(s): AMMONIA in the last 168 hours. Coagulation Profile:  Recent Labs Lab 03/28/16 2156  INR 1.09   Cardiac Enzymes:  Recent Labs Lab 03/28/16 2156 03/29/16 0212  TROPONINI 0.03* 0.04*   BNP (last 3 results) No results for input(s): PROBNP in the last 8760 hours. HbA1C: No results for input(s): HGBA1C in the last 72 hours. CBG: No results for input(s):  GLUCAP in the last 168 hours. Lipid Profile:  Recent Labs  03/29/16 0212  CHOL 87  HDL 53  LDLCALC 27  TRIG 36  CHOLHDL 1.6   Thyroid Function Tests: No results for input(s): TSH, T4TOTAL, FREET4, T3FREE, THYROIDAB in the last 72 hours. Anemia Panel: No results for input(s): VITAMINB12, FOLATE, FERRITIN, TIBC, IRON, RETICCTPCT in the last 72 hours. Urine analysis:    Component Value Date/Time   COLORURINE YELLOW 03/28/2016 0949   APPEARANCEUR TURBID (A) 03/28/2016 0949   LABSPEC 1.016 03/28/2016 0949   PHURINE 7.0 03/28/2016 0949   GLUCOSEU NEGATIVE 03/28/2016 0949   HGBUR LARGE (A) 03/28/2016 0949   BILIRUBINUR NEGATIVE 03/28/2016 0949   KETONESUR NEGATIVE 03/28/2016 0949   PROTEINUR >300 (A) 03/28/2016 0949   NITRITE NEGATIVE 03/28/2016 0949   LEUKOCYTESUR LARGE (A) 03/28/2016 0949   Sepsis Labs: @LABRCNTIP (procalcitonin:4,lacticidven:4)  ) Recent Results (from the past 240 hour(s))  MRSA PCR Screening     Status: None   Collection Time: 03/28/16  4:00 PM  Result Value Ref Range Status   MRSA by PCR NEGATIVE NEGATIVE Final    Comment:        The GeneXpert MRSA Assay (FDA approved for NASAL specimens only), is one component of a comprehensive MRSA colonization surveillance program. It is not intended to diagnose MRSA infection nor to guide or monitor treatment for MRSA infections.          Radiology Studies: Ct Abdomen Pelvis Wo Contrast  Result Date: 03/28/2016 CLINICAL DATA:  "Patient presents with his daughter who assists with the history of present illness.Patient presents with concern of discomfort about a suprapubic catheter site.He notes that over the past day he has had pain persistently in this area, and today, noticed decreased urine production.No change in the consistency of urine, which began to be produced soon after he awoke, was upright.No new fever, chills.Patient has a notable history of recent admission to a hospital in [place] for  urinary tract infection, discharged to a nursing facility.One week ago he was discharged from that facility, now moved to this area, to reside with his daughter." EXAM: CT ABDOMEN AND PELVIS WITHOUT CONTRAST TECHNIQUE: Multidetector CT imaging of the abdomen and pelvis was performed following the standard protocol without IV contrast. COMPARISON:  None. FINDINGS: Lower chest: In the left lower lobe there is a 10 x 7 mm nodule (8.5 mm mean diameter) on image 4 of series 6. Fairly dense 6 mm nodule lies in the posterior right middle lobe, image 4, series 6. Minor subsegmental atelectasis at the posterior lung bases. Heart normal in size. Hepatobiliary: Normal liver. Dependent density in the gallstone consistent with small layering stones. No wall thickening or evidence of acute cholecystitis. No bile duct dilation. Pancreas: Unremarkable. Spleen: Small calcifications consistent with healed granuloma. Otherwise unremarkable. Adrenals/Urinary Tract: No adrenal masses. Moderate  right and mild to moderate left hydronephrosis. No renal masses. No intrarenal stones. Moderate hydroureter, right somewhat greater than left. No ureteral stone. Bladder is mostly decompressed with a suprapubic catheter. Bladder is elevated by an enlarged prostate. Stomach/Bowel: Stomach and small bowel are unremarkable. There is an ileostomy in the right mid abdomen. Visualize colon is decompressed. No bowel obstruction. No bowel wall thickening or inflammation. Vascular/Lymphatic: Diffuse aortic ectasia. Maximum AP diameter of the infrarenal aorta is 2.5 cm. There is atherosclerotic calcification along the aorta and its branch vessels. No enlarged lymph nodes. Reproductive: Prostate gland is enlarged measuring 5.0 x 4.9 x 5.5 cm. Other: No ascites.  No abdominal wall mass or collection. Musculoskeletal: Degenerative changes noted of the lumbar spine. Arthropathic changes noted of the hips mostly on the right. Bones are demineralized. No  osteoblastic or discrete osteolytic lesions. IMPRESSION: 1. Bilateral hydroureteronephrosis. No ureteral stone. Bladder is mostly decompressed with a suprapubic Foley catheter, well positioned. 2. Enlarged prostate gland. 3. Gallstones without evidence of acute cholecystitis. 4. Aortic atherosclerosis. Aortic ectasia. Ectatic abdominal aorta at risk for aneurysm development. Recommend followup by ultrasound in 5 years. This recommendation follows ACR consensus guidelines: White Paper of the ACR Incidental Findings Committee II on Vascular Findings. J Am Coll Radiol 2013; 10:789-794. 5. Lung base nodules, largest with a mean diameter of 8.5 mm. Non-contrast chest CT at 3-6 months is recommended. If the nodules are stable at time of repeat CT, then future CT at 18-24 months (from today's scan) is considered optional for low-risk patients, but is recommended for high-risk patients. This recommendation follows the consensus statement: Guidelines for Management of Incidental Pulmonary Nodules Detected on CT Images:From the Fleischner Society 2017; published online before print (10.1148/radiol.SG:5268862). Electronically Signed   By: Lajean Manes M.D.   On: 03/28/2016 15:31        Scheduled Meds: . atorvastatin  20 mg Oral Daily  . cefTRIAXone (ROCEPHIN)  IV  1 g Intravenous Q24H  . cloNIDine HCl  0.1 mg Oral BID  . metoprolol tartrate  12.5 mg Oral BID  . sodium chloride flush  3 mL Intravenous Q12H   Continuous Infusions: . albuterol 10 mg/hr (03/28/16 1253)  . heparin 750 Units/hr (03/29/16 0906)  . nitroGLYCERIN Stopped (03/28/16 2254)  .  sodium bicarbonate  infusion 1000 mL 125 mL/hr at 03/29/16 0118     LOS: 1 day    Time spent: 40 minutes    Cathren Sween, Geraldo Docker, MD [facility] Pager [phone]   If 7PM-7AM, please contact night-coverage [email or web address] Password [email or web address] 03/29/2016, 9:19 AM

## 2016-03-29 NOTE — Progress Notes (Signed)
ANTICOAGULATION CONSULT NOTE   Pharmacy Consult for heparin IV Indication: chest pain/ACS  No Known Allergies  Patient Measurements: Height: 5\' 8"  (172.7 cm) Weight: 110 lb 11.2 oz (50.2 kg) IBW/kg (Calculated) : 68.4 Heparin Dosing Weight: TBW  Vital Signs: Temp: 97.9 F (36.6 C) (09/07 0751) Temp Source: Oral (09/07 0751) BP: 162/61 (09/07 0751) Pulse Rate: 79 (09/07 0751)  Labs:  Recent Labs  03/28/16 1007  03/28/16 2156 03/29/16 0212 03/29/16 0659  HGB 9.3*  --   --   --   --   HCT 30.1*  --   --   --   --   PLT 188  --   --   --   --   APTT  --   --  42*  --   --   LABPROT  --   --  14.1  --   --   INR  --   --  1.09  --   --   HEPARINUNFRC  --   --   --   --  0.11*  CREATININE 3.60*  < > 3.50* 3.71* 3.51*  TROPONINI  --   --  0.03* 0.04*  --   < > = values in this interval not displayed.  Estimated Creatinine Clearance: 10.7 mL/min (by C-G formula based on SCr of 3.51 mg/dL).   Medications:  . albuterol 10 mg/hr (03/28/16 1253)  . heparin    . nitroGLYCERIN Stopped (03/28/16 2254)  .  sodium bicarbonate  infusion 1000 mL 125 mL/hr at 03/29/16 0118    Assessment: Kendell Jenkinsis a88 y.o.malewith prostate cancer and suprapubic catheter, colostomy, recent hospitalization for UTI presents on 9/6/2017with 3-4 days abd pain and decreased UOP.  Now with chest pain, ordered NTG, ASA x1  Heparin initiated and initial heparin level subtherapeutic on heparin at 600 units/hr.  No bleeding or complications noted.  No trouble with IV line.  Goal of Therapy: Heparin level 0.3-0.7 units/ml Monitor platelets by anticoagulation protocol: Yes  Plan:  Increase IV heparin to 750 units/hr.  Recheck heparin level in 8 hrs.  Daily CBC, daily heparin level once stable  Monitor for signs of bleeding or thrombosis  Uvaldo Rising, BCPS  Clinical Pharmacist Pager [phone]  03/29/2016 8:45 AM

## 2016-03-29 NOTE — Progress Notes (Signed)
Initial Nutrition Assessment  DOCUMENTATION CODES:   Underweight, Severe malnutrition in context of chronic illness  INTERVENTION:   -Boost Breeze po TID, each supplement provides 250 kcal and 9 grams of protein -Recommend liberalizing diet as much as medically feasible to optimize PO intake  NUTRITION DIAGNOSIS:   Malnutrition related to chronic illness as evidenced by severe depletion of muscle mass, severe depletion of body fat.  GOAL:   Patient will meet greater than or equal to 90% of their needs  MONITOR:   PO intake, Supplement acceptance, Labs, Weight trends, Skin, I & O's  REASON FOR ASSESSMENT:   Other (Comment)    ASSESSMENT:   Joseph Carrillo is a 82 y.o. male with medical history significant of suprapubic catheter due to prostate cancer, colostomy, recent hospitalization for UTI, who presents [place] with 3-4 days of abdominal pain and decreased urine output.   RD drawn to chart due to underweight status.   Pt admitted for hyperkalemia and abdominal pain.   Spoke with pt at bedside, who reports poor appetite and weight loss over the past 2 months related to hospitalization for UTI ("that just takes away my appetite"). Pt confirms previous hospitalization and rehab stay in [facility] and relocated to [place] approximately 1 week ago to live with his daughter. He confirms that intake improved over the past week while at home, due to being able to consume foods that were familiar to him (mashed potatoes, fried chicken, peas, cornbread, collards, and pork chops). Observed pt lunch tray- pt consumed 100% of applesauce, 25% of broccoli, and 50% of chicken. Pt complained of lack of seasoning of food and lack of food choice- RD reviewed menu selections with pt to assist him in finding foods he liked within diet restriction.   Pt reports ongoing weight loss for years, however, unable to provide more specific details. He estimates he lost about 10# within the past month (8.3%), however, unable to  confirm due to lack of wt hx.   Pt reports he was consuming Boost daily PTA, however, concerned about K content. Pt amenable to Boost Breeze. Also discussed importance of good meal and supplement intake to promote healing.   Nutrition-Focused physical exam completed. Findings are severe fat depletion, severe muscle depletion, and no edema.   Labs reviewed: K: 5.9, Mg: 1.5.  Diet Order:  Diet Heart Room service appropriate? Yes; Fluid consistency: Thin  Skin:  Reviewed, no issues  Last BM:  03/29/16  Height:   Ht Readings from Last 1 Encounters:  03/29/16 5\' 8"  (1.727 m)    Weight:   Wt Readings from Last 1 Encounters:  03/29/16 110 lb 11.2 oz (50.2 kg)    Ideal Body Weight:  70 kg  BMI:  Body mass index is 16.83 kg/m.  Estimated Nutritional Needs:   Kcal:  1550-1750  Protein:  75-90 grams  Fluid:  1.5-1.7 L  EDUCATION NEEDS:   Education needs addressed  Aryan Bello A. Jimmye Norman, RD, LDN, CDE Pager: [phone] After hours Pager: [phone]

## 2016-03-30 ENCOUNTER — Inpatient Hospital Stay (HOSPITAL_COMMUNITY): Payer: Medicare Other

## 2016-03-30 DIAGNOSIS — N186 End stage renal disease: Secondary | ICD-10-CM

## 2016-03-30 DIAGNOSIS — E43 Unspecified severe protein-calorie malnutrition: Secondary | ICD-10-CM | POA: Insufficient documentation

## 2016-03-30 DIAGNOSIS — I2510 Atherosclerotic heart disease of native coronary artery without angina pectoris: Secondary | ICD-10-CM

## 2016-03-30 LAB — CBC WITH DIFFERENTIAL/PLATELET
BASOS ABS: 0 10*3/uL (ref 0.0–0.1)
BASOS PCT: 0 %
Eosinophils Absolute: 0.2 10*3/uL (ref 0.0–0.7)
Eosinophils Relative: 2 %
HEMATOCRIT: 28.8 % — AB (ref 39.0–52.0)
HEMOGLOBIN: 8.9 g/dL — AB (ref 13.0–17.0)
LYMPHS PCT: 10 %
Lymphs Abs: 1 10*3/uL (ref 0.7–4.0)
MCH: 28.2 pg (ref 26.0–34.0)
MCHC: 30.9 g/dL (ref 30.0–36.0)
MCV: 91.1 fL (ref 78.0–100.0)
Monocytes Absolute: 0.6 10*3/uL (ref 0.1–1.0)
Monocytes Relative: 6 %
NEUTROS ABS: 8.9 10*3/uL — AB (ref 1.7–7.7)
NEUTROS PCT: 82 %
Platelets: 154 10*3/uL (ref 150–400)
RBC: 3.16 MIL/uL — AB (ref 4.22–5.81)
RDW: 16.7 % — AB (ref 11.5–15.5)
WBC: 10.7 10*3/uL — AB (ref 4.0–10.5)

## 2016-03-30 LAB — COMPREHENSIVE METABOLIC PANEL
ALBUMIN: 2.3 g/dL — AB (ref 3.5–5.0)
ALK PHOS: 48 U/L (ref 38–126)
ALT: 53 U/L (ref 17–63)
AST: 36 U/L (ref 15–41)
Anion gap: 9 (ref 5–15)
BUN: 60 mg/dL — ABNORMAL HIGH (ref 6–20)
CHLORIDE: 104 mmol/L (ref 101–111)
CO2: 22 mmol/L (ref 22–32)
CREATININE: 3.28 mg/dL — AB (ref 0.61–1.24)
Calcium: 8.3 mg/dL — ABNORMAL LOW (ref 8.9–10.3)
GFR calc non Af Amer: 16 mL/min — ABNORMAL LOW (ref >60)
GFR, EST AFRICAN AMERICAN: 18 mL/min — AB (ref >60)
GLUCOSE: 146 mg/dL — AB (ref 65–99)
Potassium: 5.2 mmol/L — ABNORMAL HIGH (ref 3.5–5.1)
SODIUM: 135 mmol/L (ref 135–145)
Total Bilirubin: 0.3 mg/dL (ref 0.3–1.2)
Total Protein: 5.8 g/dL — ABNORMAL LOW (ref 6.5–8.1)

## 2016-03-30 LAB — MAGNESIUM: Magnesium: 1.7 mg/dL (ref 1.7–2.4)

## 2016-03-30 LAB — LACTIC ACID, PLASMA: LACTIC ACID, VENOUS: 1 mmol/L (ref 0.5–1.9)

## 2016-03-30 LAB — TROPONIN I
TROPONIN I: 0.03 ng/mL — AB (ref <0.03)
Troponin I: 0.03 ng/mL (ref <0.03)

## 2016-03-30 MED ORDER — ALBUTEROL SULFATE (2.5 MG/3ML) 0.083% IN NEBU: 2.5000 mg | INHALATION_SOLUTION | Freq: Four times a day (QID) | RESPIRATORY_TRACT | Status: DC | PRN

## 2016-03-30 MED ORDER — SODIUM CHLORIDE 0.9 % IV SOLN
INTRAVENOUS | Status: DC
  Administered 2016-03-30 – 2016-04-05 (×7): via INTRAVENOUS

## 2016-03-30 NOTE — Significant Event (Signed)
K Schorr notifed via text page concerning patients potassium increased again to 5.2, troponin remains elevated at 0.03.  Awaiting response, will continue to monitor.    Lakshmi Sundeen, Tera Mater

## 2016-03-30 NOTE — Clinical Social Work Note (Signed)
Clinical Social Work Assessment  Patient Details  Name: Joseph Carrillo MRN: [id number] Date of Birth: 11-Dec-1926  Date of referral:  03/30/16               Reason for consult:  Facility Placement                Permission sought to share information with:  Facility Sport and exercise psychologist, Family Supports Permission granted to share information::  Yes, Verbal Permission Granted  Name::     Tax adviser::  SNFs  Relationship::  Daughter  Contact Information:  [phone]  Housing/Transportation Living arrangements for the past 2 months:  [place] of Information:  Adult Children Patient Interpreter Needed:  None Criminal Activity/Legal Involvement Pertinent to Current Situation/Hospitalization:  No - Comment as needed Significant Relationships:  Adult Children Lives with:  Self Do you feel safe going back to the place where you live?  No Need for family participation in patient care:  Yes (Comment)  Care giving concerns:  CSW received consult for possible SNF placement at time of discharge. CSW spoke with patient's daughter at bedside regarding SNF placement at time of discharge. Per patient's daughter, patient falls a lot and is currently unable to care for himself at home given patient's current physical needs and fall risk. Patient and patient's daughter expressed understanding of PT recommendation and are agreeable to SNF placement at time of discharge if recommended by PT. CSW to continue to follow and assist with discharge planning needs.   Social Worker assessment / plan:  CSW spoke with patient's daughter concerning possibility of rehab at [facility] before returning home.  Employment status:  Retired Nurse, adult PT Recommendations:  Not assessed at this time Information / Referral to community resources:  [place]  Patient/Family's Response to care: Ppatient's daughter recognizes need for rehab before returning home and  is agreeable to a SNF in [place].   Patient/Family's Understanding of and Emotional Response to Diagnosis, Current Treatment, and Prognosis:  Patient/family is realistic regarding therapy needs and expressed being hopeful for SNF placement. Patient expressed understanding of CSW role and discharge process. No questions/concerns about plan or treatment.    Emotional Assessment Appearance:  Appears stated age Attitude/Demeanor/Rapport:  Other (Appropriate) Affect (typically observed):  Appropriate Orientation:  Oriented to Self, Oriented to Place, Oriented to  Time Alcohol / Substance use:  Not Applicable Psych involvement (Current and /or in the community):  No (Comment)  Discharge Needs  Concerns to be addressed:  Care Coordination Readmission within the last 30 days:  No Current discharge risk:  None Barriers to Discharge:  Continued Medical Work up   [employer], [place] 03/30/2016, 5:31 PM

## 2016-03-30 NOTE — Progress Notes (Signed)
Critical lab result of 0.03 troponin however this is a trending level for the pt and so these results are not being called.

## 2016-03-30 NOTE — Progress Notes (Addendum)
Preliminary results by tech - Renal Duplex Completed. There appeared to be no obvious evidence of renal artery stenosis noted in the main renal arteries bilaterally. It is noted technically challenged study with colostomy in place. Bilateral mild hydronephrosis is noted.  Oda Cogan, BS, RDMS, RVT

## 2016-03-30 NOTE — Progress Notes (Signed)
Spoke w da. [place] and got [place]. Da had picked pt up and brought him to [place]. [facility] had few nse visits. Da would like to cancel [facility] ref w [facility]. Spoke w mary to let her know of cancelation by da. Da would like short term snf for rehab. expained will need pt and ot notes for [facility] medic[facility]. Made sw ref. When pt leaves facility da will switch to [facility] one of phy had referred to them and she will use them if needed in future.

## 2016-03-30 NOTE — Progress Notes (Signed)
[facility] Progress Note  Patient: Joseph Carrillo [id number]   PCP: [name], MD DOB: [birth year]   DOA: 03/28/2016   DOS: 03/30/2016   Date of Service: the patient was seen and examined on 03/30/2016  Subjective: Denies any acute complaint. No abdominal pain no dizziness or lightheadedness. Records are available from recent admission in August. Nutrition: Tolerating oral diet  Brief hospital course: Pt. with PMH of prostate cancer with chronic suprapubic catheter, coronary disease S/P CABG, chronic kidney disease; admitted on 03/28/2016, with complaint of fatigue, was found to have acute on chronic kidney disease with hyperkalemia. Currently further plan is continue monitoring of renal function with IV hydration.  Assessment and Plan: 1. Hyperkalemia. Patient received calcium chloride, dextrose, insulin, albuterol, bicarbonate and as well as Kayexalate. Potassium is 5.2. Patient was not on renal diet and I have placed him on renal diet today. Patient was drinking a lot of orange juice probably the reason for patient's hyperkalemia. We'll continue to monitor.   2. Acute on chronic kidney disease. chronic obstructive uropathy, suprapubic catheter, Patient has chronic kidney disease based on his recent discharge from [place] facility with serum creatinine of 2.0. Patient presented there with creatinine of 3.8 and responded to IV fluids. Patient had bilateral hydronephrosis there although I am unable to get any information regarding any procedures performed recently are not. Appears to be having only right-sided mild hydronephrosis here. Renal function relatively stable. Urology on board. We'll continue to closely monitor. Next and patient may require outpatient follow-up.  3. Lung nodule. Patient will require a dedicated noncontrast CT chest in 3-6 month as well as pulmonary follow-up.  4. Chronic anemia. Hemoglobin has dropped from 10.2-8.9 this is likely  hemodilution. We'll continue to closely monitor.  5. Chest pain. Patient has chronic chest pain which is likely thought to be GERD. Cardiology was consulted. Patient was initially started on heparin as well as nitroglycerin drip and transferred to step down unit. At present cardiology does not feel that this is any cardiac chest pain and therefore anticoagulation as well as nitroglycerin is stopped. Continue to closely monitor.  6. Protein calorie malnutrition, severe. Continue supplementation.  Pain management: He had an Tylenol Activity: Consulted physical therapy Bowel regimen: last BM 03/30/2016 Diet: Renal diet DVT Prophylaxis: subcutaneous Heparin  Advance goals of care discussion: Full code  Family Communication: Discussed with patient's daughter, Opportunity was given to ask question and all questions were answered satisfactorily.   Disposition:  Discharge to home with home health. Expected discharge date: 04/01/2016,   Consultants: Urology Procedures: None  Antibiotics: Anti-infectives    Start     Dose/Rate Route Frequency Ordered Stop   03/28/16 1600  vancomycin (VANCOCIN) IVPB 750 mg/150 ml premix     750 mg 150 mL/hr over 60 Minutes Intravenous  Once 03/28/16 1453 03/28/16 1718   03/28/16 1400  cefTRIAXone (ROCEPHIN) 1 g in dextrose 5 % 50 mL IVPB     1 g 100 mL/hr over 30 Minutes Intravenous Every 24 hours 03/28/16 1337          Intake/Output Summary (Last 24 hours) at 03/30/16 2058 Last data filed at 03/30/16 2016  Gross per 24 hour  Intake          2460.42 ml  Output             1081 ml  Net          1379.42 ml   Filed Weights   03/28/16 1600 03/29/16 0615  Weight:  51.6 kg (113 lb 12.1 oz) 50.2 kg (110 lb 11.2 oz)    Objective: Physical Exam: Vitals:   03/30/16 1200 03/30/16 1216 03/30/16 1400 03/30/16 1658  BP: (!) 118/55  (!) 91/44 (!) 118/48  Pulse: 76  77 (!) 57  Resp: 16  15 16   Temp:  97.4 F (36.3 C)  98.5 F (36.9 C)  TempSrc:   Oral  Oral  SpO2: 99%  99% 100%  Weight:      Height:        General: Alert, Awake and Oriented to Time, Place and Person. Appear in mild distress Eyes: PERRL, Conjunctiva normal ENT: Oral Mucosa clear moist. Neck: no JVD, no Abnormal Mass Or lumps Cardiovascular: S1 and S2 Present, aortic systolic Murmur, Respiratory: Bilateral Air entry equal and Decreased, Clear to Auscultation, no Crackles, no wheezes Abdomen: Bowel Sound present, Soft and no tenderness Skin: no redness, no Rash  Extremities: no Pedal edema, no calf tenderness Neurologic: Grossly no focal neuro deficit. Bilaterally Equal motor strength  Data Reviewed: CBC:  Recent Labs Lab 03/28/16 1007 03/29/16 1113 03/30/16 0301  WBC 15.0* 13.4* 10.7*  NEUTROABS 12.8* 11.6* 8.9*  HGB 9.3* 10.2* 8.9*  HCT 30.1* 33.7* 28.8*  MCV 89.3 91.3 91.1  PLT 188 193 [id number]   Basic Metabolic Panel:  Recent Labs Lab 03/29/16 0659 03/29/16 1113 03/29/16 1503 03/29/16 1900 03/30/16 0301  NA 142 144 143 138 135  K 6.0* 5.9* 5.3* 4.7 5.2*  CL 113* 112* 111 109 104  CO2 19* 22 23 20* 22  GLUCOSE 80 116* 166* 166* 146*  BUN 70* 68* 65* 65* 60*  CREATININE 3.51* 3.42* 3.45* 3.35* 3.28*  CALCIUM 9.6 9.7 9.4 8.6* 8.3*  MG  --  1.5*  --   --  1.7    Liver Function Tests:  Recent Labs Lab 03/28/16 1007 03/30/16 0301  AST 38 36  ALT 62 53  ALKPHOS 61 48  BILITOT 0.9 0.3  PROT 7.2 5.8*  ALBUMIN 3.1* 2.3*   No results for input(s): LIPASE, AMYLASE in the last 168 hours. No results for input(s): AMMONIA in the last 168 hours. Coagulation Profile:  Recent Labs Lab 03/28/16 2156  INR 1.09   Cardiac Enzymes:  Recent Labs Lab 03/28/16 2156 03/29/16 0212 03/30/16 0301 03/30/16 0857  TROPONINI 0.03* 0.04* 0.03* 0.03*   BNP (last 3 results) No results for input(s): PROBNP in the last 8760 hours.  CBG: No results for input(s): GLUCAP in the last 168 hours.  Studies: No results found.   Scheduled Meds: .  atorvastatin  20 mg Oral Daily  . cefTRIAXone (ROCEPHIN)  IV  1 g Intravenous Q24H  . cloNIDine HCl  0.1 mg Oral BID  . feeding supplement  1 Container Oral TID BM  . heparin  5,000 Units Subcutaneous Q8H  . mouth rinse  15 mL Mouth Rinse BID  . metoprolol tartrate  12.5 mg Oral BID  . sodium chloride flush  3 mL Intravenous Q12H   Continuous Infusions: . sodium chloride 75 mL/hr at 03/30/16 1804   PRN Meds: albuterol, hydrALAZINE, HYDROcodone-acetaminophen, nitroGLYCERIN  Time spent: 30 minutes  Author: Berle Mull, MD [facility] Pager: [phone] 03/30/2016 8:58 PM  If 7PM-7AM, please contact night-coverage at [email or web address], password [facility]

## 2016-03-30 NOTE — Progress Notes (Signed)
     SUBJECTIVE: No complaints  Tele: sinus  BP (!) 126/52   Pulse 67   Temp 98.9 F (37.2 C) (Oral)   Resp 13   Ht 5\' 8"  (1.727 m)   Wt 110 lb 11.2 oz (50.2 kg)   SpO2 99%   BMI 16.83 kg/m   Intake/Output Summary (Last 24 hours) at 03/30/16 0711 Last data filed at 03/30/16 0600  Gross per 24 hour  Intake          2991.75 ml  Output             2530 ml  Net           461.75 ml    PHYSICAL EXAM General: thin, cachectic male. NAD.  Psych:  Good affect, responds appropriately Neck: No JVD. No masses noted.  Lungs: Clear bilaterally with no wheezes or rhonci noted.  Heart: RRR with no murmurs noted. Abdomen: Bowel sounds are present. Soft, non-tender.  Extremities: No lower extremity edema.   LABS: Basic Metabolic Panel:  Recent Labs  03/29/16 1113  03/29/16 1900 03/30/16 0301  NA 144  < > 138 135  K 5.9*  < > 4.7 5.2*  CL 112*  < > 109 104  CO2 22  < > 20* 22  GLUCOSE 116*  < > 166* 146*  BUN 68*  < > 65* 60*  CREATININE 3.42*  < > 3.35* 3.28*  CALCIUM 9.7  < > 8.6* 8.3*  MG 1.5*  --   --  1.7  < > = values in this interval not displayed. CBC:  Recent Labs  03/29/16 1113 03/30/16 0301  WBC 13.4* 10.7*  NEUTROABS 11.6* 8.9*  HGB 10.2* 8.9*  HCT 33.7* 28.8*  MCV 91.3 91.1  PLT 193 154   Cardiac Enzymes:  Recent Labs  03/28/16 2156 03/29/16 0212 03/30/16 0301  TROPONINI 0.03* 0.04* 0.03*   Fasting Lipid Panel:  Recent Labs  03/29/16 0212  CHOL 87  HDL 53  LDLCALC 27  TRIG 36  CHOLHDL 1.6    Current Meds: . atorvastatin  20 mg Oral Daily  . cefTRIAXone (ROCEPHIN)  IV  1 g Intravenous Q24H  . cloNIDine HCl  0.1 mg Oral BID  . feeding supplement  1 Container Oral TID BM  . heparin  5,000 Units Subcutaneous Q8H  . mouth rinse  15 mL Mouth Rinse BID  . metoprolol tartrate  12.5 mg Oral BID  . sodium chloride flush  3 mL Intravenous Q12H     ASSESSMENT AND PLAN:  1.  Hyperkalemia:  In the setting of renal failure. 5.2 this  morning.  Ongoing treatment per IM.  2.  Acute renal failure (likely on CKD): Urology following in setting of bilat hydroureteronephrosis.    3.  Elevated troponin/CAD:  Pt reports prior CABG in 2000.  He reported left arm/shoulder/bicep pain on admission.  ECG notable primarily for changes consistent with hyperkalemia.  Troponin minimally elevated in setting of renal failure. Peak of only 0.04.  He says that he was doing well from a cardiac standpoint since his valve surgery in ~ 2005. Echo today. NO plans for ischemic evaluation at this time given his renal failure. Continue beta blocker and statin.    Lauree Chandler  9/8/20177:11 AM

## 2016-03-30 NOTE — Progress Notes (Signed)
Reviewed chart Unknown baseline renal function as previously noted Cr mildly improved today No UTI as per urine c/s Not ideal to place percutaneous tubes - do so if clincially needed for renal failure; please get previous notes

## 2016-03-31 DIAGNOSIS — N183 Chronic kidney disease, stage 3 (moderate): Secondary | ICD-10-CM

## 2016-03-31 LAB — URINE CULTURE

## 2016-03-31 LAB — CBC WITH DIFFERENTIAL/PLATELET
BASOS PCT: 0 %
Basophils Absolute: 0 10*3/uL (ref 0.0–0.1)
EOS ABS: 0.2 10*3/uL (ref 0.0–0.7)
EOS PCT: 2 %
HCT: 27 % — ABNORMAL LOW (ref 39.0–52.0)
HEMOGLOBIN: 8.2 g/dL — AB (ref 13.0–17.0)
LYMPHS ABS: 1 10*3/uL (ref 0.7–4.0)
Lymphocytes Relative: 8 %
MCH: 27.8 pg (ref 26.0–34.0)
MCHC: 30.4 g/dL (ref 30.0–36.0)
MCV: 91.5 fL (ref 78.0–100.0)
MONO ABS: 0.8 10*3/uL (ref 0.1–1.0)
MONOS PCT: 6 %
NEUTROS PCT: 84 %
Neutro Abs: 10.1 10*3/uL — ABNORMAL HIGH (ref 1.7–7.7)
PLATELETS: 160 10*3/uL (ref 150–400)
RBC: 2.95 MIL/uL — ABNORMAL LOW (ref 4.22–5.81)
RDW: 16.4 % — AB (ref 11.5–15.5)
WBC: 12.2 10*3/uL — ABNORMAL HIGH (ref 4.0–10.5)

## 2016-03-31 LAB — RENAL FUNCTION PANEL
ALBUMIN: 2.1 g/dL — AB (ref 3.5–5.0)
ANION GAP: 8 (ref 5–15)
BUN: 66 mg/dL — AB (ref 6–20)
CALCIUM: 8.2 mg/dL — AB (ref 8.9–10.3)
CO2: 22 mmol/L (ref 22–32)
Chloride: 107 mmol/L (ref 101–111)
Creatinine, Ser: 3.39 mg/dL — ABNORMAL HIGH (ref 0.61–1.24)
GFR calc Af Amer: 17 mL/min — ABNORMAL LOW (ref >60)
GFR calc non Af Amer: 15 mL/min — ABNORMAL LOW (ref >60)
GLUCOSE: 108 mg/dL — AB (ref 65–99)
PHOSPHORUS: 5 mg/dL — AB (ref 2.5–4.6)
Potassium: 5.4 mmol/L — ABNORMAL HIGH (ref 3.5–5.1)
SODIUM: 137 mmol/L (ref 135–145)

## 2016-03-31 LAB — MAGNESIUM: Magnesium: 1.7 mg/dL (ref 1.7–2.4)

## 2016-03-31 MED ORDER — SODIUM POLYSTYRENE SULFONATE 15 GM/60ML PO SUSP
30.0000 g | Freq: Once | ORAL | Status: AC
  Administered 2016-03-31: 30 g via ORAL
  Filled 2016-03-31: qty 120

## 2016-03-31 NOTE — Progress Notes (Signed)
   82 y/o ? with a h/o CAD, valvular heart dzs, HL, prostate CA, and recent prolonged hospitalization in the setting of UTI, who was admitted 9/6 with weakness, hyperkalemia, and renal failure. troponins are very low and flat. Not c/w ACS    SUBJECTIVE: No cardiac complaints Did not get his oatmeal this am  - still has not had breakfast    Tele: sinus  BP (!) 137/42 (BP Location: Right Arm)   Pulse (!) 53   Temp 98.1 F (36.7 C) (Oral)   Resp 18   Ht 5\' 8"  (1.727 m)   Wt 119 lb 4.3 oz (54.1 kg)   SpO2 100%   BMI 18.13 kg/m   Intake/Output Summary (Last 24 hours) at 03/31/16 0946 Last data filed at 03/31/16 0600  Gross per 24 hour  Intake             1185 ml  Output              626 ml  Net              559 ml    PHYSICAL EXAM General: thin, cachectic male. NAD.  Psych:  Good affect, responds appropriately Neck: No JVD. No masses noted.  Lungs: Clear bilaterally with no wheezes or rhonci noted.  Heart: RRR with no murmurs noted. Abdomen: Bowel sounds are present. Soft, non-tender.  Extremities: No lower extremity edema.   LABS: Basic Metabolic Panel:  Recent Labs  03/30/16 0301 03/31/16 0307  NA 135 137  K 5.2* 5.4*  CL 104 107  CO2 22 22  GLUCOSE 146* 108*  BUN 60* 66*  CREATININE 3.28* 3.39*  CALCIUM 8.3* 8.2*  MG 1.7 1.7  PHOS  --  5.0*   CBC:  Recent Labs  03/30/16 0301 03/31/16 0307  WBC 10.7* 12.2*  NEUTROABS 8.9* 10.1*  HGB 8.9* 8.2*  HCT 28.8* 27.0*  MCV 91.1 91.5  PLT 154 160   Cardiac Enzymes:  Recent Labs  03/29/16 0212 03/30/16 0301 03/30/16 0857  TROPONINI 0.04* 0.03* 0.03*   Fasting Lipid Panel:  Recent Labs  03/29/16 0212  CHOL 87  HDL 53  LDLCALC 27  TRIG 36  CHOLHDL 1.6    Current Meds: . atorvastatin  20 mg Oral Daily  . cefTRIAXone (ROCEPHIN)  IV  1 g Intravenous Q24H  . cloNIDine HCl  0.1 mg Oral BID  . feeding supplement  1 Container Oral TID BM  . heparin  5,000 Units Subcutaneous Q8H  . mouth  rinse  15 mL Mouth Rinse BID  . metoprolol tartrate  12.5 mg Oral BID  . sodium chloride flush  3 mL Intravenous Q12H     ASSESSMENT AND PLAN:  1.  Hyperkalemia:  In the setting of renal failure.  Further plans per IM   2.  Acute renal failure (likely on CKD): Urology following in setting of bilat hydroureteronephrosis.    3.  Elevated troponin/CAD:  Pt reports prior CABG in 2000.  He reported left arm/shoulder/bicep pain on admission.  ECG notable primarily for changes consistent with hyperkalemia.  Troponin minimally elevated in setting of renal failure. Peak of only 0.04.  He says that he was doing well from a cardiac standpoint since his valve surgery in ~ 2005.   no new recs.  Will sign off. Call for questions   Mertie Moores  9/9/20179:46 AM

## 2016-03-31 NOTE — Evaluation (Signed)
Physical Therapy Evaluation Patient Details Name: Joseph Carrillo MRN: [id number] DOB: Mar 04, 1927 Today's Date: 03/31/2016   History of Present Illness  Patient is an 82 yo male admitted 03/28/16 with abdominal pain.  Patient with hyperkalemia and acute on CKD.   PMH:  multiple falls, suprapubic catheter, CAD, CABG, CKD, anemia, GERD  Clinical Impression  Patient presents with problems listed below.  Will benefit from acute PT to maximize functional mobility prior to discharge.  Patient with decreased strength, activity tolerance, balance, all impacting mobility and safety.  Recommend SNF at d/c for continued therapy.    Follow Up Recommendations SNF;Supervision/Assistance - 24 hour    Equipment Recommendations  3in1 (PT)    Recommendations for Other Services       Precautions / Restrictions Precautions Precautions: Fall Precaution Comments: h/o falls Restrictions Weight Bearing Restrictions: No      Mobility  Bed Mobility Overal bed mobility: Needs Assistance Bed Mobility: Supine to Sit     Supine to sit: Min guard     General bed mobility comments: Assist for safety.  Increased time and use of rail.  Transfers Overall transfer level: Needs assistance Equipment used: Rolling walker (2 wheeled) Transfers: Sit to/from Stand Sit to Stand: Min assist         General transfer comment: Verbal cues for hand placement.  Assist to rise to standing and for balance.  Ambulation/Gait Ambulation/Gait assistance: Min assist;Mod assist Ambulation Distance (Feet): 52 Feet Assistive device: Rolling walker (2 wheeled) Gait Pattern/deviations: Step-through pattern;Decreased step length - right;Decreased step length - left;Decreased stride length;Shuffle;Scissoring;Drifts right/left;Trunk flexed;Narrow base of support Gait velocity: decreased Gait velocity interpretation: Below normal speed for age/gender General Gait Details: Verbal cues for safe use of RW.  Patient with slow,  shuffling steps.  Adducted/scissoring steps causing patient to step on own feet.  Cues to widen stance/BOS.  Requires assist for balance/safety.  Patient fatigues quickly.  Stairs            Wheelchair Mobility    Modified Rankin (Stroke Patients Only)       Balance Overall balance assessment: Needs assistance;History of Falls Sitting-balance support: No upper extremity supported;Feet supported Sitting balance-Leahy Scale: Fair     Standing balance support: Bilateral upper extremity supported Standing balance-Leahy Scale: Poor                               Pertinent Vitals/Pain Pain Assessment: 0-10 Pain Score: 8  (and 6 abdomen) Pain Location: 8 buttocks;  6 abdomen Pain Descriptors / Indicators: Sore;Tender Pain Intervention(s): Monitored during session;Repositioned    Home Living Family/patient expects to be discharged to:: Private residence Living Arrangements: Children (lives with daughter) Available Help at Discharge: Family;Available 24 hours/day Type of Home: House Home Access: Level entry     Home Layout: One level Home Equipment: Walker - 2 wheels;Cane - single point      Prior Function Level of Independence: Independent with assistive device(s)         Comments: Uses cane outside of home.     Hand Dominance        Extremity/Trunk Assessment   Upper Extremity Assessment: Generalized weakness           Lower Extremity Assessment: Generalized weakness         Communication   Communication: No difficulties  Cognition Arousal/Alertness: Awake/alert Behavior During Therapy: WFL for tasks assessed/performed Overall Cognitive Status: Within Functional Limits for tasks assessed  General Comments      Exercises        Assessment/Plan    PT Assessment Patient needs continued PT services  PT Diagnosis Difficulty walking;Abnormality of gait;Generalized weakness;Acute pain   PT Problem  List Decreased strength;Decreased activity tolerance;Decreased balance;Decreased mobility;Decreased knowledge of use of DME;Pain  PT Treatment Interventions DME instruction;Gait training;Functional mobility training;Therapeutic activities;Therapeutic exercise;Balance training;Patient/family education   PT Goals (Current goals can be found in the Care Plan section) Acute Rehab PT Goals Patient Stated Goal: To get stronger PT Goal Formulation: With patient/family Time For Goal Achievement: 04/14/16 Potential to Achieve Goals: Good    Frequency Min 3X/week   Barriers to discharge        Co-evaluation               End of Session Equipment Utilized During Treatment: Gait belt Activity Tolerance: Patient limited by fatigue;Patient limited by pain Patient left: in chair;with call bell/phone within reach;with family/visitor present Nurse Communication: Mobility status         Time: [id number] PT Time Calculation (min) (ACUTE ONLY): 23 min   Charges:   PT Evaluation $PT Eval Moderate Complexity: 1 Procedure PT Treatments $Gait Training: 8-22 mins   PT G CodesDespina Pole 2016-04-14, 11:56 AM Carita Pian. Sanjuana Kava, [place] Pager [phone]

## 2016-03-31 NOTE — Progress Notes (Signed)
[facility]  PROGRESS NOTE  Joseph Carrillo [id number] DOB: 08-04-1926 DOA: 03/28/2016 PCP: [name], MD    Brief HPI:   Pt. with PMH of prostate cancer with chronic suprapubic catheter, coronary disease S/P CABG, chronic kidney disease; admitted on 03/28/2016, with complaint of fatigue, was found to have acute on chronic kidney disease with hyperkalemia. Currently further plan is continue monitoring of renal function with IV hydration.    Assessment/Plan:    1. Hyperkalemia-potassium was 5.4 daily, likely from worsening renal function, will give 30 g Kayexalate. Follow BMP in a.m. 2. Acute kidney injury on CKD stage III- reviewed the records from [facility] in [place], patient's  creatinine as of 02/29/2016 was 2.0. Patient now has chronic obstructive uropathy, urology will decide further he would benefit from nephrostomy tubes. Reviewed ultrasound of kidneys done on 8/17 showed mild bilateral hydronephrosis of uncertain etiology. 3. Lung nodule- patient will require noncontrast CT chest in 3-6 months as outpatient. 4. Chronic anemia- hemoglobin has dropped to 8.2, likely from anemia of chronic kidney disease. Patient had EGD done on 02/24/2016 at [facility] showed no significant abnormality. Colonoscopy was not performed due to advanced age. Patient's hemoglobin was 10.5 on 02/29/2016. We'll continue to monitor patient's hemoglobin in the hospital. 5. Abnormal UA- urine cultures x 2 growing only multiple bacterial colonies consistent with contamination.  6. Chest pain-patient had mild elevation of troponin, cardiology was consulted. No intervention recommended at this time. Cardiology has signed off   DVT prophylaxis: Heparin Code Status: Full code Family Communication: Discussed with patient's daughter at bedside  Disposition Plan: Skilled nursing  facility   Consultants:  Urology  Cardiology  Procedures:  None  Antibiotics:  None   Subjective   Patient seen and examined, denies abdominal pain. No chest pain or shortness of breath.  Objective    Objective: Vitals:   03/30/16 1658 03/30/16 2102 03/31/16 0453 03/31/16 1028  BP: (!) 118/48 (!) 130/45 (!) 137/42 (!) 148/52  Pulse: (!) 57 63 (!) 53 79  Resp: 16 16 18 18   Temp: 98.5 F (36.9 C) 98.4 F (36.9 C) 98.1 F (36.7 C) 98.4 F (36.9 C)  TempSrc: Oral Oral Oral Oral  SpO2: 100% 100% 100% 100%  Weight:  54.1 kg (119 lb 4.3 oz)    Height:        Intake/Output Summary (Last 24 hours) at 03/31/16 1224 Last data filed at 03/31/16 1200  Gross per 24 hour  Intake             1060 ml  Output             1400 ml  Net             -340 ml   Filed Weights   03/28/16 1600 03/29/16 0615 03/30/16 2102  Weight: 51.6 kg (113 lb 12.1 oz) 50.2 kg (110 lb 11.2 oz) 54.1 kg (119 lb 4.3 oz)    Examination:  General exam: Appears calm and comfortable  Respiratory system: Clear to auscultation. Respiratory effort normal. Cardiovascular system: S1 & S2 heard, RRR. No JVD, murmurs, rubs, gallops or clicks. No pedal edema. Gastrointestinal system: Abdomen is nondistended, soft and nontender. No organomegaly or masses felt. Normal bowel sounds heard. Central nervous system: Alert and oriented. No focal neurological deficits. Extremities: Symmetric 5 x 5 power. Skin: No rashes, lesions or ulcers Psychiatry: Judgement and insight appear normal. Mood & affect appropriate.    Data Reviewed: I have personally  reviewed following labs and imaging studies Basic Metabolic Panel:  Recent Labs Lab 03/29/16 1113 03/29/16 1503 03/29/16 1900 03/30/16 0301 03/31/16 0307  NA 144 143 138 135 137  K 5.9* 5.3* 4.7 5.2* 5.4*  CL 112* 111 109 104 107  CO2 22 23 20* 22 22  GLUCOSE 116* 166* 166* 146* 108*  BUN 68* 65* 65* 60* 66*  CREATININE 3.42* 3.45* 3.35* 3.28* 3.39*   CALCIUM 9.7 9.4 8.6* 8.3* 8.2*  MG 1.5*  --   --  1.7 1.7  PHOS  --   --   --   --  5.0*   Liver Function Tests:  Recent Labs Lab 03/28/16 1007 03/30/16 0301 03/31/16 0307  AST 38 36  --   ALT 62 53  --   ALKPHOS 61 48  --   BILITOT 0.9 0.3  --   PROT 7.2 5.8*  --   ALBUMIN 3.1* 2.3* 2.1*   No results for input(s): LIPASE, AMYLASE in the last 168 hours. No results for input(s): AMMONIA in the last 168 hours. CBC:  Recent Labs Lab 03/28/16 1007 03/29/16 1113 03/30/16 0301 03/31/16 0307  WBC 15.0* 13.4* 10.7* 12.2*  NEUTROABS 12.8* 11.6* 8.9* 10.1*  HGB 9.3* 10.2* 8.9* 8.2*  HCT 30.1* 33.7* 28.8* 27.0*  MCV 89.3 91.3 91.1 91.5  PLT 188 193 154 160   Cardiac Enzymes:  Recent Labs Lab 03/28/16 2156 03/29/16 0212 03/30/16 0301 03/30/16 0857  TROPONINI 0.03* 0.04* 0.03* 0.03*   BNP (last 3 results) No results for input(s): BNP in the last 8760 hours.  ProBNP (last 3 results) No results for input(s): PROBNP in the last 8760 hours.  CBG: No results for input(s): GLUCAP in the last 168 hours.  Recent Results (from the past 240 hour(s))  Culture, Urine     Status: Abnormal   Collection Time: 03/28/16  9:49 AM  Result Value Ref Range Status   Specimen Description URINE, RANDOM  Final   Special Requests NONE  Final   Culture MULTIPLE SPECIES PRESENT, SUGGEST RECOLLECTION (A)  Final   Report Status 03/29/2016 FINAL  Final  MRSA PCR Screening     Status: None   Collection Time: 03/28/16  4:00 PM  Result Value Ref Range Status   MRSA by PCR NEGATIVE NEGATIVE Final    Comment:        The GeneXpert MRSA Assay (FDA approved for NASAL specimens only), is one component of a comprehensive MRSA colonization surveillance program. It is not intended to diagnose MRSA infection nor to guide or monitor treatment for MRSA infections.   Culture, Urine     Status: Abnormal   Collection Time: 03/29/16  7:53 PM  Result Value Ref Range Status   Specimen Description  URINE, SUPRAPUBIC  Final   Special Requests NONE  Final   Culture MULTIPLE SPECIES PRESENT, SUGGEST RECOLLECTION (A)  Final   Report Status 03/31/2016 FINAL  Final     Studies: US Renal  Result Date: 03/29/2016 CLINICAL DATA:  Acute on chronic renal failure EXAM: RENAL / URINARY TRACT ULTRASOUND COMPLETE COMPARISON:  03/28/2016 FINDINGS: Right Kidney: Length: 9.9 cm.  Mild to moderate hydronephrosis is noted. Left Kidney: Length: 9.5 cm. Mild to moderate hydronephrosis is noted slightly less than that on the right. Bladder: Decompressed by suprapubic catheter IMPRESSION: Mild to moderate hydronephrosis bilaterally right slightly greater than left. This is stable from the recent CT. Electronically Signed   By: Inez Catalina M.D.   On: 03/29/2016 14:24  Scheduled Meds: . atorvastatin  20 mg Oral Daily  . cefTRIAXone (ROCEPHIN)  IV  1 g Intravenous Q24H  . cloNIDine HCl  0.1 mg Oral BID  . feeding supplement  1 Container Oral TID BM  . heparin  5,000 Units Subcutaneous Q8H  . mouth rinse  15 mL Mouth Rinse BID  . metoprolol tartrate  12.5 mg Oral BID  . sodium chloride flush  3 mL Intravenous Q12H   Continuous Infusions: . sodium chloride 75 mL/hr at 03/31/16 0923       Time spent: 25 min    [facility] Pager [phone]. If 7PM-7AM, please contact night-coverage at [email or web address], Office  [phone]  password [place] 03/31/2016, 12:24 PM  LOS: 3 days

## 2016-04-01 LAB — RENAL FUNCTION PANEL
ANION GAP: 10 (ref 5–15)
Albumin: 2.3 g/dL — ABNORMAL LOW (ref 3.5–5.0)
BUN: 65 mg/dL — ABNORMAL HIGH (ref 6–20)
CHLORIDE: 112 mmol/L — AB (ref 101–111)
CO2: 19 mmol/L — ABNORMAL LOW (ref 22–32)
Calcium: 8.9 mg/dL (ref 8.9–10.3)
Creatinine, Ser: 3.49 mg/dL — ABNORMAL HIGH (ref 0.61–1.24)
GFR, EST AFRICAN AMERICAN: 17 mL/min — AB (ref >60)
GFR, EST NON AFRICAN AMERICAN: 15 mL/min — AB (ref >60)
Glucose, Bld: 89 mg/dL (ref 65–99)
POTASSIUM: 5.6 mmol/L — AB (ref 3.5–5.1)
Phosphorus: 5.6 mg/dL — ABNORMAL HIGH (ref 2.5–4.6)
Sodium: 141 mmol/L (ref 135–145)

## 2016-04-01 LAB — CBC
HEMATOCRIT: 29.2 % — AB (ref 39.0–52.0)
HEMOGLOBIN: 9 g/dL — AB (ref 13.0–17.0)
MCH: 28.3 pg (ref 26.0–34.0)
MCHC: 30.8 g/dL (ref 30.0–36.0)
MCV: 91.8 fL (ref 78.0–100.0)
Platelets: 176 10*3/uL (ref 150–400)
RBC: 3.18 MIL/uL — AB (ref 4.22–5.81)
RDW: 16.5 % — ABNORMAL HIGH (ref 11.5–15.5)
WBC: 9.9 10*3/uL (ref 4.0–10.5)

## 2016-04-01 MED ORDER — MORPHINE SULFATE (PF) 2 MG/ML IV SOLN
2.0000 mg | Freq: Once | INTRAVENOUS | Status: DC
  Filled 2016-04-01: qty 1

## 2016-04-01 MED ORDER — SODIUM POLYSTYRENE SULFONATE 15 GM/60ML PO SUSP
30.0000 g | Freq: Once | ORAL | Status: AC
  Administered 2016-04-01: 30 g via ORAL
  Filled 2016-04-01: qty 120

## 2016-04-01 MED ORDER — HYDROCODONE-ACETAMINOPHEN 5-325 MG PO TABS
2.0000 | ORAL_TABLET | Freq: Once | ORAL | Status: AC
  Administered 2016-04-01: 2 via ORAL
  Filled 2016-04-01: qty 2

## 2016-04-01 NOTE — Progress Notes (Addendum)
  Subjective: Patient reports he is feeling well.  Objective: Vital signs in last 24 hours: Temp:  [97.4 F (36.3 C)-99.6 F (37.6 C)] 97.4 F (36.3 C) (09/10 0511) Pulse Rate:  [55-79] 55 (09/10 0511) Resp:  [18-19] 19 (09/10 0511) BP: (127-148)/(41-52) 127/41 (09/10 0511) SpO2:  [100 %] 100 % (09/10 0511)  Intake/Output from previous day: 09/09 0701 - 09/10 0700 In: 600 [P.O.:600] Out: 1375 [Urine:1075; Stool:300] Intake/Output this shift: Total I/O In: -  Out: 600 [Urine:600]  Physical Exam:  Constitutional: Vital signs reviewed. WD WN in NAD   Eyes: PERRL, No scleral icterus.   Pulmonary/Chest: Normal effort   Lab Results:  Recent Labs  03/29/16 1113 03/30/16 0301 03/31/16 0307  HGB 10.2* 8.9* 8.2*  HCT 33.7* 28.8* 27.0*   BMET  Recent Labs  03/30/16 0301 03/31/16 0307  NA 135 137  K 5.2* 5.4*  CL 104 107  CO2 22 22  GLUCOSE 146* 108*  BUN 60* 66*  CREATININE 3.28* 3.39*  CALCIUM 8.3* 8.2*   No results for input(s): LABPT, INR in the last 72 hours. No results for input(s): LABURIN in the last 72 hours. Results for orders placed or performed during the hospital encounter of 03/28/16  Culture, Urine     Status: Abnormal   Collection Time: 03/28/16  9:49 AM  Result Value Ref Range Status   Specimen Description URINE, RANDOM  Final   Special Requests NONE  Final   Culture MULTIPLE SPECIES PRESENT, SUGGEST RECOLLECTION (A)  Final   Report Status 03/29/2016 FINAL  Final  MRSA PCR Screening     Status: None   Collection Time: 03/28/16  4:00 PM  Result Value Ref Range Status   MRSA by PCR NEGATIVE NEGATIVE Final    Comment:        The GeneXpert MRSA Assay (FDA approved for NASAL specimens only), is one component of a comprehensive MRSA colonization surveillance program. It is not intended to diagnose MRSA infection nor to guide or monitor treatment for MRSA infections.   Culture, Urine     Status: Abnormal   Collection Time: 03/29/16  7:53  PM  Result Value Ref Range Status   Specimen Description URINE, SUPRAPUBIC  Final   Special Requests NONE  Final   Culture MULTIPLE SPECIES PRESENT, SUGGEST RECOLLECTION (A)  Final   Report Status 03/31/2016 FINAL  Final    Studies/Results: No results found.  Assessment/Plan:   Bilateral hydronephrosis despite adequate bladder drainage.  Renal insufficiency has not improved with hydration.    At this point, I have spoken with the patient about placement of percutaneous nephrostomy tubes with probable eventual internalization with double-J stents, bilaterally.  I drew out a diagram how this is done, and explained this to the patient.  I will discuss this with interventional radiology and get this scheduled early in the week, if possible.  I will hold the patient's heparin.   LOS: 4 days   Franchot Gallo M 04/01/2016, 6:09 AM  I spoke with Dr. Earleen Newport from interventional radiology.  Hopefully, this procedure can be done early in the week.  Would strongly consider internalization of stents eventually.

## 2016-04-01 NOTE — Consult Note (Signed)
Chief Complaint: Patient was seen in consultation today for bilateral percutaneous nephrostomy placement Chief Complaint  Patient presents with  . Abdominal Pain  . Foot Pain   at the request of Dr Franchot Gallo  Referring Physician(s): Dr Franchot Gallo  Supervising Physician: Corrie Mckusick  Patient Status: Inpatient  History of Present Illness: Joseph Carrillo is a 82 y.o. male   Hx prostate Ca with chronic suprapubic catheter Admitted through ED 9/6 Decreased urinary output of catheter Weakness; fatigue Acute on chronic renal disease Obstructive uropathy Increasing Creatinine CT 9/6: IMPRESSION: 1. Bilateral hydroureteronephrosis. No ureteral stone. Bladder is mostly decompressed with a suprapubic Foley catheter, well positioned.  Dr Diona Fanti has seen pt Requesting B percutaneous nephrostomy tubes Dr Earleen Newport has reviewed imaging and approves procedure  Past Medical History:  Diagnosis Date  . Blood in stool   . Blood transfusion without reported diagnosis   . CAD (coronary artery disease)    a. 2000 - Says he had CABG x 3 or 4.  . Chicken pox   . Colon polyp   . Elevated blood pressure reading   . Glaucoma   . Heart murmur   . Hx: UTI (urinary tract infection)   . Hyperlipidemia   . Prostate cancer ([facility])   . Valvular heart disease    a. 2005 s/p Valve replacement - presumably bioprosthetic (not on anticoagulation).  He doesn't know which valve.    Past Surgical History:  Procedure Laterality Date  . CORONARY ARTERY BYPASS GRAFT    . LUNG SURGERY    . PROSTATE SURGERY      Allergies: Review of patient's allergies indicates no known allergies.  Medications: Prior to Admission medications   Medication Sig Start Date End Date Taking? Authorizing Provider  atorvastatin (LIPITOR) 20 MG tablet Take 20 mg by mouth daily.   Yes Historical Provider, MD  cloNIDine HCl (KAPVAY) 0.1 MG TB12 ER tablet Take 0.1 mg by mouth 2 (two) times daily.   Yes  Historical Provider, MD  HYDROcodone-acetaminophen (NORCO/VICODIN) 5-325 MG tablet Take 1 tablet by mouth every 6 (six) hours as needed. 03/22/16  Yes Gay Filler Copland, MD  sodium bicarbonate 650 MG tablet Take 650 mg by mouth 4 (four) times daily.   Yes Historical Provider, MD  nitroGLYCERIN (NITROSTAT) 0.4 MG SL tablet Place 0.4 mg under the tongue every 5 (five) minutes as needed for chest pain.    Historical Provider, MD     Family History  Problem Relation Age of Onset  . Heart disease Father     Pt says his brother had heart infection and died suddenly.    Social History   Social History  . Marital status: Married    Spouse name: N/A  . Number of children: N/A  . Years of education: N/A   Social History Main Topics  . Smoking status: Former Smoker    Quit date: 07/24/1971  . Smokeless tobacco: Never Used  . Alcohol use No     Comment: prev drank.  quit 1973 or 1974.  . Drug use: No  . Sexual activity: Not Asked   Other Topics Concern  . None   Social History Narrative   Lives in [place], [place] area by himself but is currently staying with his sister in [place] area after prolonged hospitalization this summer.     Review of Systems: A 12 point ROS discussed and pertinent positives are indicated in the HPI above.  All other systems are negative.  Review of  Systems  Constitutional: Positive for activity change, appetite change and fatigue. Negative for fever.  Respiratory: Negative for shortness of breath.   Cardiovascular: Negative for chest pain.  Gastrointestinal: Negative for nausea and vomiting.  Genitourinary:       Decreased output into supra pubic catheter  Neurological: Positive for weakness.  Psychiatric/Behavioral: Negative for behavioral problems and confusion.    Vital Signs: BP (!) 126/48 (BP Location: Right Arm)   Pulse (!) 49   Temp 98.2 F (36.8 C) (Oral)   Resp 18   Ht 5\' 8"  (1.727 m)   Wt 119 lb 4.3 oz (54.1 kg)   SpO2 99%   BMI 18.13 kg/m     Physical Exam  Constitutional: He is oriented to person, place, and time.  Cardiovascular: Normal rate and regular rhythm.   Pulmonary/Chest: Effort normal and breath sounds normal.  Abdominal: Soft. Bowel sounds are normal.  Musculoskeletal: Normal range of motion.  Neurological: He is alert and oriented to person, place, and time.  Skin: Skin is warm and dry.  Psychiatric: He has a normal mood and affect. His behavior is normal. Judgment and thought content normal.  Nursing note and vitals reviewed.   Mallampati Score:  MD Evaluation Airway: WNL Heart: WNL Abdomen: WNL Chest/ Lungs: WNL ASA  Classification: 3 Mallampati/Airway Score: One  Imaging: Ct Abdomen Pelvis Wo Contrast  Result Date: 03/28/2016 CLINICAL DATA:  "Patient presents with his daughter who assists with the history of present illness.Patient presents with concern of discomfort about a suprapubic catheter site.He notes that over the past day he has had pain persistently in this area, and today, noticed decreased urine production.No change in the consistency of urine, which began to be produced soon after he awoke, was upright.No new fever, chills.Patient has a notable history of recent admission to a hospital in [place] for urinary tract infection, discharged to a nursing facility.One week ago he was discharged from that facility, now moved to this area, to reside with his daughter." EXAM: CT ABDOMEN AND PELVIS WITHOUT CONTRAST TECHNIQUE: Multidetector CT imaging of the abdomen and pelvis was performed following the standard protocol without IV contrast. COMPARISON:  None. FINDINGS: Lower chest: In the left lower lobe there is a 10 x 7 mm nodule (8.5 mm mean diameter) on image 4 of series 6. Fairly dense 6 mm nodule lies in the posterior right middle lobe, image 4, series 6. Minor subsegmental atelectasis at the posterior lung bases. Heart normal in size. Hepatobiliary: Normal liver. Dependent density in the  gallstone consistent with small layering stones. No wall thickening or evidence of acute cholecystitis. No bile duct dilation. Pancreas: Unremarkable. Spleen: Small calcifications consistent with healed granuloma. Otherwise unremarkable. Adrenals/Urinary Tract: No adrenal masses. Moderate right and mild to moderate left hydronephrosis. No renal masses. No intrarenal stones. Moderate hydroureter, right somewhat greater than left. No ureteral stone. Bladder is mostly decompressed with a suprapubic catheter. Bladder is elevated by an enlarged prostate. Stomach/Bowel: Stomach and small bowel are unremarkable. There is an ileostomy in the right mid abdomen. Visualize colon is decompressed. No bowel obstruction. No bowel wall thickening or inflammation. Vascular/Lymphatic: Diffuse aortic ectasia. Maximum AP diameter of the infrarenal aorta is 2.5 cm. There is atherosclerotic calcification along the aorta and its branch vessels. No enlarged lymph nodes. Reproductive: Prostate gland is enlarged measuring 5.0 x 4.9 x 5.5 cm. Other: No ascites.  No abdominal wall mass or collection. Musculoskeletal: Degenerative changes noted of the lumbar spine. Arthropathic changes noted of the  hips mostly on the right. Bones are demineralized. No osteoblastic or discrete osteolytic lesions. IMPRESSION: 1. Bilateral hydroureteronephrosis. No ureteral stone. Bladder is mostly decompressed with a suprapubic Foley catheter, well positioned. 2. Enlarged prostate gland. 3. Gallstones without evidence of acute cholecystitis. 4. Aortic atherosclerosis. Aortic ectasia. Ectatic abdominal aorta at risk for aneurysm development. Recommend followup by ultrasound in 5 years. This recommendation follows ACR consensus guidelines: White Paper of the ACR Incidental Findings Committee II on Vascular Findings. J Am Coll Radiol 2013; 10:789-794. 5. Lung base nodules, largest with a mean diameter of 8.5 mm. Non-contrast chest CT at 3-6 months is recommended. If  the nodules are stable at time of repeat CT, then future CT at 18-24 months (from today's scan) is considered optional for low-risk patients, but is recommended for high-risk patients. This recommendation follows the consensus statement: Guidelines for Management of Incidental Pulmonary Nodules Detected on CT Images:From the Fleischner Society 2017; published online before print (10.1148/radiol.SG:5268862). Electronically Signed   By: Lajean Manes M.D.   On: 03/28/2016 15:31   US Renal  Result Date: 03/29/2016 CLINICAL DATA:  Acute on chronic renal failure EXAM: RENAL / URINARY TRACT ULTRASOUND COMPLETE COMPARISON:  03/28/2016 FINDINGS: Right Kidney: Length: 9.9 cm.  Mild to moderate hydronephrosis is noted. Left Kidney: Length: 9.5 cm. Mild to moderate hydronephrosis is noted slightly less than that on the right. Bladder: Decompressed by suprapubic catheter IMPRESSION: Mild to moderate hydronephrosis bilaterally right slightly greater than left. This is stable from the recent CT. Electronically Signed   By: Inez Catalina M.D.   On: 03/29/2016 14:24    Labs:  CBC:  Recent Labs  03/29/16 1113 03/30/16 0301 03/31/16 0307 04/01/16 0523  WBC 13.4* 10.7* 12.2* 9.9  HGB 10.2* 8.9* 8.2* 9.0*  HCT 33.7* 28.8* 27.0* 29.2*  PLT 193 154 160 176    COAGS:  Recent Labs  03/28/16 2156  INR 1.09  APTT 42*    BMP:  Recent Labs  03/29/16 1900 03/30/16 0301 03/31/16 0307 04/01/16 0519  NA 138 135 137 141  K 4.7 5.2* 5.4* 5.6*  CL 109 104 107 112*  CO2 20* 22 22 19*  GLUCOSE 166* 146* 108* 89  BUN 65* 60* 66* 65*  CALCIUM 8.6* 8.3* 8.2* 8.9  CREATININE 3.35* 3.28* 3.39* 3.49*  GFRNONAA 15* 16* 15* 15*  GFRAA 18* 18* 17* 17*    LIVER FUNCTION TESTS:  Recent Labs  03/28/16 1007 03/30/16 0301 03/31/16 0307 04/01/16 0519  BILITOT 0.9 0.3  --   --   AST 38 36  --   --   ALT 62 53  --   --   ALKPHOS 61 48  --   --   PROT 7.2 5.8*  --   --   ALBUMIN 3.1* 2.3* 2.1* 2.3*     TUMOR MARKERS: No results for input(s): AFPTM, CEA, CA199, CHROMGRNA in the last 8760 hours.  Assessment and Plan:  B hydronephrosis Obstructive uropathy Scheduled for B PCN in IR  Risks and Benefits discussed with the patient including, but not limited to infection, bleeding, significant bleeding causing loss or decrease in renal function or damage to adjacent structures.  All of the patient's questions were answered, patient is agreeable to proceed. Consent signed and in chart.   Thank you for this interesting consult.  I greatly enjoyed meeting Burlin Godkin and look forward to participating in their care.  A copy of this report was sent to the requesting provider on this date.  Electronically Signed: Allisyn Kunz A 04/01/2016, 9:17 AM    I spent a total of 40 Minutes    in face to face in clinical consultation, greater than 50% of which was counseling/coordinating care for B PCN

## 2016-04-01 NOTE — Progress Notes (Addendum)
[facility]  PROGRESS NOTE  Joseph Carrillo [id number] DOB: [birth year] DOA: 03/28/2016 PCP: [name], MD    Brief HPI:   Pt. with PMH of prostate cancer with chronic suprapubic catheter, coronary disease S/P CABG, chronic kidney disease; admitted on 03/28/2016, with complaint of fatigue, was found to have acute on chronic kidney disease with hyperkalemia. Currently further plan is continue monitoring of renal function with IV hydration.    Assessment/Plan:    1. Hyperkalemia-potassium is still elevated, potassium is 5.6, likely from worsening renal function, will give 30 g Kayexalate. Follow BMP in a.m. 2. Acute kidney injury on CKD stage III- reviewed the records from [facility] in [place], patient's  creatinine as of 02/29/2016 was 2.0. Patient now has chronic obstructive uropathy, urology will decide further he would benefit from nephrostomy tubes. Reviewed ultrasound of kidneys done on 8/17 showed mild bilateral hydronephrosis of uncertain etiology. 3. Bilateral hydronephrosis with worsening renal function- spoke to Urology, plan for bilateral nephrostomy tubes. 4. Lung nodule- patient will require noncontrast CT chest in 3-6 months as outpatient. 5. Chronic anemia- hemoglobin has improved to 9.0, from  8.2, likely from anemia of chronic kidney disease. Patient had EGD done on 02/24/2016 at [facility] showed no significant abnormality. Colonoscopy was not performed due to advanced age. Patient's hemoglobin was 10.5 on 02/29/2016. We'll continue to monitor patient's hemoglobin in the hospital. 6. Abnormal UA- urine cultures x 2 growing only multiple bacterial colonies consistent with contamination.  7. Chest pain-patient had mild elevation of troponin, cardiology was consulted. No intervention recommended at this time. Cardiology has signed off   DVT prophylaxis: Heparin Code Status: Full code Family Communication:  Discussed with patient's daughter at bedside  Disposition Plan: Skilled nursing facility   Consultants:  Urology  Cardiology  Procedures:  None  Antibiotics:  None   Subjective   Patient seen and examined, denies abdominal pain.   Objective    Objective: Vitals:   03/31/16 1721 03/31/16 2104 04/01/16 0511 04/01/16 0730  BP: (!) 148/47 (!) 143/46 (!) 127/41 (!) 126/48  Pulse: 66 63 (!) 55 (!) 49  Resp: 18 18 19 18   Temp: 97.4 F (36.3 C) 99.6 F (37.6 C) 97.4 F (36.3 C) 98.2 F (36.8 C)  TempSrc: Oral Oral Oral Oral  SpO2: 100% 100% 100% 99%  Weight:      Height:        Intake/Output Summary (Last 24 hours) at 04/01/16 0930 Last data filed at 04/01/16 0600  Gross per 24 hour  Intake              900 ml  Output             1350 ml  Net             -450 ml   Filed Weights   03/28/16 1600 03/29/16 0615 03/30/16 2102  Weight: 51.6 kg (113 lb 12.1 oz) 50.2 kg (110 lb 11.2 oz) 54.1 kg (119 lb 4.3 oz)    Examination:  General exam: Appears calm and comfortable  Respiratory system: Clear to auscultation. Respiratory effort normal. Cardiovascular system: S1 & S2 heard, RRR. No JVD, murmurs, rubs, gallops or clicks. No pedal edema. Gastrointestinal system: Abdomen is nondistended, soft and nontender. No organomegaly or masses felt. Normal bowel sounds heard. Central nervous system: Alert and oriented. No focal neurological deficits. Extremities: Symmetric 5 x 5 power. Skin: No rashes, lesions or ulcers Psychiatry: Judgement and insight appear normal. Mood &  affect appropriate.    Data Reviewed: I have personally reviewed following labs and imaging studies Basic Metabolic Panel:  Recent Labs Lab 03/29/16 1113 03/29/16 1503 03/29/16 1900 03/30/16 0301 03/31/16 0307 04/01/16 0519  NA 144 143 138 135 137 141  K 5.9* 5.3* 4.7 5.2* 5.4* 5.6*  CL 112* 111 109 104 107 112*  CO2 22 23 20* 22 22 19*  GLUCOSE 116* 166* 166* 146* 108* 89  BUN 68* 65* 65*  60* 66* 65*  CREATININE 3.42* 3.45* 3.35* 3.28* 3.39* 3.49*  CALCIUM 9.7 9.4 8.6* 8.3* 8.2* 8.9  MG 1.5*  --   --  1.7 1.7  --   PHOS  --   --   --   --  5.0* 5.6*   Liver Function Tests:  Recent Labs Lab 03/28/16 1007 03/30/16 0301 03/31/16 0307 04/01/16 0519  AST 38 36  --   --   ALT 62 53  --   --   ALKPHOS 61 48  --   --   BILITOT 0.9 0.3  --   --   PROT 7.2 5.8*  --   --   ALBUMIN 3.1* 2.3* 2.1* 2.3*   No results for input(s): LIPASE, AMYLASE in the last 168 hours. No results for input(s): AMMONIA in the last 168 hours. CBC:  Recent Labs Lab 03/28/16 1007 03/29/16 1113 03/30/16 0301 03/31/16 0307 04/01/16 0523  WBC 15.0* 13.4* 10.7* 12.2* 9.9  NEUTROABS 12.8* 11.6* 8.9* 10.1*  --   HGB 9.3* 10.2* 8.9* 8.2* 9.0*  HCT 30.1* 33.7* 28.8* 27.0* 29.2*  MCV 89.3 91.3 91.1 91.5 91.8  PLT 188 193 154 160 176   Cardiac Enzymes:  Recent Labs Lab 03/28/16 2156 03/29/16 0212 03/30/16 0301 03/30/16 0857  TROPONINI 0.03* 0.04* 0.03* 0.03*   BNP (last 3 results) No results for input(s): BNP in the last 8760 hours.  ProBNP (last 3 results) No results for input(s): PROBNP in the last 8760 hours.  CBG: No results for input(s): GLUCAP in the last 168 hours.  Recent Results (from the past 240 hour(s))  Culture, Urine     Status: Abnormal   Collection Time: 03/28/16  9:49 AM  Result Value Ref Range Status   Specimen Description URINE, RANDOM  Final   Special Requests NONE  Final   Culture MULTIPLE SPECIES PRESENT, SUGGEST RECOLLECTION (A)  Final   Report Status 03/29/2016 FINAL  Final  MRSA PCR Screening     Status: None   Collection Time: 03/28/16  4:00 PM  Result Value Ref Range Status   MRSA by PCR NEGATIVE NEGATIVE Final    Comment:        The GeneXpert MRSA Assay (FDA approved for NASAL specimens only), is one component of a comprehensive MRSA colonization surveillance program. It is not intended to diagnose MRSA infection nor to guide or monitor  treatment for MRSA infections.   Culture, Urine     Status: Abnormal   Collection Time: 03/29/16  7:53 PM  Result Value Ref Range Status   Specimen Description URINE, SUPRAPUBIC  Final   Special Requests NONE  Final   Culture MULTIPLE SPECIES PRESENT, SUGGEST RECOLLECTION (A)  Final   Report Status 03/31/2016 FINAL  Final     Studies: No results found.  Scheduled Meds: . atorvastatin  20 mg Oral Daily  . cefTRIAXone (ROCEPHIN)  IV  1 g Intravenous Q24H  . cloNIDine HCl  0.1 mg Oral BID  . feeding supplement  1 Container Oral  TID BM  . mouth rinse  15 mL Mouth Rinse BID  . metoprolol tartrate  12.5 mg Oral BID  .  morphine injection  2 mg Intravenous Once  . sodium chloride flush  3 mL Intravenous Q12H   Continuous Infusions: . sodium chloride 75 mL/hr at 03/31/16 0923       Time spent: 25 min    [facility] Pager [phone]. If 7PM-7AM, please contact night-coverage at [email or web address], Office  [phone]  password [email or web address] 04/01/2016, 9:30 AM  LOS: 4 days

## 2016-04-02 ENCOUNTER — Encounter (HOSPITAL_COMMUNITY): Payer: Self-pay | Admitting: Interventional Radiology

## 2016-04-02 ENCOUNTER — Inpatient Hospital Stay (HOSPITAL_COMMUNITY): Payer: Medicare Other

## 2016-04-02 HISTORY — PX: IR GENERIC HISTORICAL: IMG1180011

## 2016-04-02 LAB — RENAL FUNCTION PANEL
Albumin: 2.3 g/dL — ABNORMAL LOW (ref 3.5–5.0)
Anion gap: 7 (ref 5–15)
BUN: 67 mg/dL — AB (ref 6–20)
CHLORIDE: 115 mmol/L — AB (ref 101–111)
CO2: 16 mmol/L — AB (ref 22–32)
Calcium: 8.6 mg/dL — ABNORMAL LOW (ref 8.9–10.3)
Creatinine, Ser: 3.55 mg/dL — ABNORMAL HIGH (ref 0.61–1.24)
GFR calc Af Amer: 17 mL/min — ABNORMAL LOW (ref >60)
GFR calc non Af Amer: 14 mL/min — ABNORMAL LOW (ref >60)
GLUCOSE: 80 mg/dL (ref 65–99)
POTASSIUM: 5.9 mmol/L — AB (ref 3.5–5.1)
Phosphorus: 5.1 mg/dL — ABNORMAL HIGH (ref 2.5–4.6)
Sodium: 138 mmol/L (ref 135–145)

## 2016-04-02 LAB — PROTIME-INR
INR: >10
INR: 1.15
Prothrombin Time: >90 seconds — ABNORMAL HIGH (ref 11.4–15.2)
Prothrombin Time: 14.7 seconds (ref 11.4–15.2)

## 2016-04-02 LAB — PLATELET COUNT: PLATELETS: 195 10*3/uL (ref 150–400)

## 2016-04-02 LAB — FIBRINOGEN: Fibrinogen: 530 mg/dL — ABNORMAL HIGH (ref 210–475)

## 2016-04-02 LAB — APTT: APTT: 32 s (ref 24–36)

## 2016-04-02 LAB — SAVE SMEAR

## 2016-04-02 MED ORDER — FENTANYL CITRATE (PF) 100 MCG/2ML IJ SOLN
INTRAMUSCULAR | Status: AC
  Filled 2016-04-02: qty 4

## 2016-04-02 MED ORDER — HEPARIN SODIUM (PORCINE) 5000 UNIT/ML IJ SOLN
5000.0000 [IU] | Freq: Three times a day (TID) | INTRAMUSCULAR | Status: DC
  Administered 2016-04-03 – 2016-04-05 (×7): 5000 [IU] via SUBCUTANEOUS
  Filled 2016-04-02 (×8): qty 1

## 2016-04-02 MED ORDER — CIPROFLOXACIN IN D5W 400 MG/200ML IV SOLN
400.0000 mg | INTRAVENOUS | Status: AC
  Administered 2016-04-02: 400 mg via INTRAVENOUS
  Filled 2016-04-02: qty 200

## 2016-04-02 MED ORDER — FENTANYL CITRATE (PF) 100 MCG/2ML IJ SOLN
INTRAMUSCULAR | Status: AC | PRN
  Administered 2016-04-02: 25 ug via INTRAVENOUS
  Administered 2016-04-02: 50 ug via INTRAVENOUS

## 2016-04-02 MED ORDER — HEPARIN SODIUM (PORCINE) 5000 UNIT/ML IJ SOLN: 5000.0000 [IU] | Freq: Three times a day (TID) | INTRAMUSCULAR | Status: DC

## 2016-04-02 MED ORDER — CIPROFLOXACIN IN D5W 400 MG/200ML IV SOLN
INTRAVENOUS | Status: AC
  Filled 2016-04-02: qty 200

## 2016-04-02 MED ORDER — MIDAZOLAM HCL 2 MG/2ML IJ SOLN
INTRAMUSCULAR | Status: AC
  Filled 2016-04-02: qty 4

## 2016-04-02 MED ORDER — LIDOCAINE HCL 1 % IJ SOLN
INTRAMUSCULAR | Status: AC
  Administered 2016-04-02: 10 mL
  Filled 2016-04-02: qty 20

## 2016-04-02 MED ORDER — MIDAZOLAM HCL 2 MG/2ML IJ SOLN
INTRAMUSCULAR | Status: AC | PRN
  Administered 2016-04-02 (×2): 1 mg via INTRAVENOUS

## 2016-04-02 MED ORDER — IOPAMIDOL (ISOVUE-300) INJECTION 61%
INTRAVENOUS | Status: AC
  Administered 2016-04-02: 20 mL
  Filled 2016-04-02: qty 50

## 2016-04-02 NOTE — Sedation Documentation (Signed)
Patient is resting comfortably. 

## 2016-04-02 NOTE — Progress Notes (Addendum)
MEDICATION RELATED CONSULT NOTE   IR Procedure Consult - Anticoagulant/Antiplatelet PTA/Inpatient Med List Review by Pharmacist    Procedure: [place] and fluoroscopic guided placement of a bilateral nephrostomy    Completed: 04/02/16 @1546   Post-Procedural bleeding risk per IR MD assessment:  standard  Antithrombotic medications on inpatient or PTA profile prior to procedure:   Prophylactic heparin subcutaneously    Plan:     Restart heparin 5000 units subcutaneously q 8 hours (9/12 morning)

## 2016-04-02 NOTE — Progress Notes (Signed)
Reviewed chart It appears that PT and PTT are now normal? It is my understanding perc tubes are to be placed for Cr 3.55 Baseline Cr 02/29/16 was 2.0 outside medical records Agree with perc tubes Please call Dr Matilde Sprang at [phone] to discuss

## 2016-04-02 NOTE — Sedation Documentation (Signed)
Nephrostomy tube on L side in; will place R side tube now

## 2016-04-02 NOTE — Progress Notes (Signed)
PT Cancellation Note  Patient Details Name: Joseph Carrillo MRN: [id number] DOB: [birth year]   Cancelled Treatment:    Reason Eval/Treat Not Completed:  Nephrostomy tube being placed;   (Noted re-draw of INR normal)  Will follow up later today as time allows;  Otherwise, will follow up for PT tomorrow;   Thank you,  Roney Marion, [place] Pager [phone] Office [phone]     Roney Marion [facility] 04/02/2016, 3:38 PM

## 2016-04-02 NOTE — Progress Notes (Signed)
Montez Morita, patient's wife called and wants MD to call her.  [phone])  Dr. Darrick Meigs notified.

## 2016-04-02 NOTE — Progress Notes (Signed)
CRITICAL VALUE ALERT  Critical value received:  INR >10  Date of notification:  04/02/16  Time of notification:  8:28 - Cassell Smiles  Critical value read back: yes  Nurse who received alert:  Lurena Joiner  MD notified (1st page):  Dr. Darrick Meigs  Time of first page:  8:29  MD notified (2nd page): Dr. Darrick Meigs  Time of second page:  Responding MD:  Dr. Darrick Meigs  Time MD responded:  8:31

## 2016-04-02 NOTE — Procedures (Signed)
Successful [place] and fluoroscopic guided placement of a bilateral PCNs with ends coiled and locked within the renal pelves.  PCNs connected to gravity bag.  EBL: None  No immediate post procedural complications.   Ronny Bacon, MD Pager #: [phone]

## 2016-04-02 NOTE — Progress Notes (Signed)
PT Cancellation Note  Patient Details Name: Sidhant Kult MRN: [id number] DOB: [birth year]   Cancelled Treatment:    Reason Eval/Treat Not Completed: Medical issues which prohibited therapy   Noted INR>10; not appropriate to mobilize;   Will check back tomorrow;   Roney Marion, [place] Pager [phone] Office [phone]     Roney Marion [facility] 04/02/2016, 2:03 PM

## 2016-04-02 NOTE — Progress Notes (Signed)
[facility]  PROGRESS NOTE  Joseph Carrillo [id number] DOB: 1927-03-14 DOA: 03/28/2016 PCP: [name], MD    Brief HPI:   Pt. with PMH of prostate cancer with chronic suprapubic catheter, coronary disease S/P CABG, chronic kidney disease; admitted on 03/28/2016, with complaint of fatigue, was found to have acute on chronic kidney disease with hyperkalemia. Currently further plan is continue monitoring of renal function with IV hydration.    Assessment/Plan:    1. Hyperkalemia-potassium is still elevated, potassium is 5.6, likely from worsening renal function,Was given Kayexalate for prostatism with no improvement 2. Elevated PT/INR- ? Cause, has no history of liver disease, has a remote history of taking Coumadin. Has not been on anti-coagulation therapy. Will repeat PT/PTT, check DIC panel. Called and discussed with hematology/oncology on call Dr. Jana Hakim. If PT/INR still elevated then order mixing study. 3. Acute kidney injury on CKD stage III- reviewed the records from [facility] in [place], patient's  creatinine as of 02/29/2016 was 2.0. Patient now has chronic obstructive uropathy, urology will decide further he would benefit from nephrostomy tubes. Reviewed ultrasound of kidneys done on 8/17 showed mild bilateral hydronephrosis of uncertain etiology. 4. Bilateral hydronephrosis with worsening renal function- spoke to Urology, plan for bilateral nephrostomy tubes. 5. Lung nodule- patient will require noncontrast CT chest in 3-6 months as outpatient. 6. Chronic anemia- hemoglobin has improved to 9.0, from  8.2, likely from anemia of chronic kidney disease. Patient had EGD done on 02/24/2016 at [facility] showed no significant abnormality. Colonoscopy was not performed due to advanced age. Patient's hemoglobin was 10.5 on 02/29/2016. We'll continue to monitor patient's hemoglobin in the hospital. 7. Abnormal UA- urine  cultures x 2 growing only multiple bacterial colonies consistent with contamination.  8. Chest pain-patient had mild elevation of troponin, cardiology was consulted. No intervention recommended at this time. Cardiology has signed off   DVT prophylaxis: Heparin Code Status: Full code Family Communication: Discussed with patient's daughter at bedside  Disposition Plan: Skilled nursing facility   Consultants:  Urology  Cardiology  Procedures:  None  Antibiotics:  None   Subjective   Patient seen and examined, denies abdominal pain.Labs done today showed INR greater than 10. Patient denies any history of liver disease, has not been taking Coumadin. No history of bleeding disorder  Objective    Objective: Vitals:   04/01/16 1624 04/01/16 2153 04/02/16 0617 04/02/16 0935  BP: (!) 133/47 (!) 142/55 (!) 127/48 (!) 135/38  Pulse: 70 82 (!) 58 (!) 51  Resp: 17 18 19 18   Temp: 98.7 F (37.1 C) 98.4 F (36.9 C) 98.3 F (36.8 C) 98.3 F (36.8 C)  TempSrc: Oral Oral Oral Oral  SpO2: 100% 100% 100% 100%  Weight:  55 kg (121 lb 4.1 oz)    Height:        Intake/Output Summary (Last 24 hours) at 04/02/16 1316 Last data filed at 04/02/16 1000  Gross per 24 hour  Intake             2705 ml  Output             2970 ml  Net             -265 ml   Filed Weights   03/29/16 0615 03/30/16 2102 04/01/16 2153  Weight: 50.2 kg (110 lb 11.2 oz) 54.1 kg (119 lb 4.3 oz) 55 kg (121 lb 4.1 oz)    Examination:  General exam: Appears calm and comfortable  Respiratory system: Clear to auscultation. Respiratory effort normal. Cardiovascular system: S1 & S2 heard, RRR. No JVD, murmurs, rubs, gallops or clicks. No pedal edema. Gastrointestinal system: Abdomen is nondistended, soft and nontender. No organomegaly or masses felt. Normal bowel sounds heard. Central nervous system: Alert and oriented. No focal neurological deficits. Extremities: Symmetric 5 x 5 power. Skin: No rashes, lesions  or ulcers Psychiatry: Judgement and insight appear normal. Mood & affect appropriate.    Data Reviewed: I have personally reviewed following labs and imaging studies Basic Metabolic Panel:  Recent Labs Lab 03/29/16 1113  03/29/16 1900 03/30/16 0301 03/31/16 0307 04/01/16 0519 04/02/16 0619  NA 144  < > 138 135 137 141 138  K 5.9*  < > 4.7 5.2* 5.4* 5.6* 5.9*  CL 112*  < > 109 104 107 112* 115*  CO2 22  < > 20* 22 22 19* 16*  GLUCOSE 116*  < > 166* 146* 108* 89 80  BUN 68*  < > 65* 60* 66* 65* 67*  CREATININE 3.42*  < > 3.35* 3.28* 3.39* 3.49* 3.55*  CALCIUM 9.7  < > 8.6* 8.3* 8.2* 8.9 8.6*  MG 1.5*  --   --  1.7 1.7  --   --   PHOS  --   --   --   --  5.0* 5.6* 5.1*  < > = values in this interval not displayed. Liver Function Tests:  Recent Labs Lab 03/28/16 1007 03/30/16 0301 03/31/16 0307 04/01/16 0519 04/02/16 0619  AST 38 36  --   --   --   ALT 62 53  --   --   --   ALKPHOS 61 48  --   --   --   BILITOT 0.9 0.3  --   --   --   PROT 7.2 5.8*  --   --   --   ALBUMIN 3.1* 2.3* 2.1* 2.3* 2.3*   No results for input(s): LIPASE, AMYLASE in the last 168 hours. No results for input(s): AMMONIA in the last 168 hours. CBC:  Recent Labs Lab 03/28/16 1007 03/29/16 1113 03/30/16 0301 03/31/16 0307 04/01/16 0523  WBC 15.0* 13.4* 10.7* 12.2* 9.9  NEUTROABS 12.8* 11.6* 8.9* 10.1*  --   HGB 9.3* 10.2* 8.9* 8.2* 9.0*  HCT 30.1* 33.7* 28.8* 27.0* 29.2*  MCV 89.3 91.3 91.1 91.5 91.8  PLT 188 193 154 160 176   Cardiac Enzymes:  Recent Labs Lab 03/28/16 2156 03/29/16 0212 03/30/16 0301 03/30/16 0857  TROPONINI 0.03* 0.04* 0.03* 0.03*   BNP (last 3 results) No results for input(s): BNP in the last 8760 hours.  ProBNP (last 3 results) No results for input(s): PROBNP in the last 8760 hours.  CBG: No results for input(s): GLUCAP in the last 168 hours.  Recent Results (from the past 240 hour(s))  Culture, Urine     Status: Abnormal   Collection Time: 03/28/16   9:49 AM  Result Value Ref Range Status   Specimen Description URINE, RANDOM  Final   Special Requests NONE  Final   Culture MULTIPLE SPECIES PRESENT, SUGGEST RECOLLECTION (A)  Final   Report Status 03/29/2016 FINAL  Final  MRSA PCR Screening     Status: None   Collection Time: 03/28/16  4:00 PM  Result Value Ref Range Status   MRSA by PCR NEGATIVE NEGATIVE Final    Comment:        The GeneXpert MRSA Assay (FDA approved for NASAL specimens only), is one component of  a comprehensive MRSA colonization surveillance program. It is not intended to diagnose MRSA infection nor to guide or monitor treatment for MRSA infections.   Culture, Urine     Status: Abnormal   Collection Time: 03/29/16  7:53 PM  Result Value Ref Range Status   Specimen Description URINE, SUPRAPUBIC  Final   Special Requests NONE  Final   Culture MULTIPLE SPECIES PRESENT, SUGGEST RECOLLECTION (A)  Final   Report Status 03/31/2016 FINAL  Final     Studies: No results found.  Scheduled Meds: . atorvastatin  20 mg Oral Daily  . cefTRIAXone (ROCEPHIN)  IV  1 g Intravenous Q24H  . ciprofloxacin  400 mg Intravenous to XRAY  . cloNIDine HCl  0.1 mg Oral BID  . feeding supplement  1 Container Oral TID BM  . mouth rinse  15 mL Mouth Rinse BID  . metoprolol tartrate  12.5 mg Oral BID  .  morphine injection  2 mg Intravenous Once  . sodium chloride flush  3 mL Intravenous Q12H   Continuous Infusions: . sodium chloride 75 mL/hr at 04/02/16 1154       Time spent: 25 min    [facility] Pager [phone]. If 7PM-7AM, please contact night-coverage at [email or web address], Office  [phone]  password [email or web address] 04/02/2016, 1:16 PM  LOS: 5 days

## 2016-04-02 NOTE — Progress Notes (Signed)
Correct time 10 am

## 2016-04-03 LAB — RENAL FUNCTION PANEL
Albumin: 2.3 g/dL — ABNORMAL LOW (ref 3.5–5.0)
Anion gap: 8 (ref 5–15)
BUN: 61 mg/dL — AB (ref 6–20)
CALCIUM: 8.8 mg/dL — AB (ref 8.9–10.3)
CHLORIDE: 114 mmol/L — AB (ref 101–111)
CO2: 17 mmol/L — AB (ref 22–32)
CREATININE: 3.2 mg/dL — AB (ref 0.61–1.24)
GFR calc Af Amer: 19 mL/min — ABNORMAL LOW (ref >60)
GFR calc non Af Amer: 16 mL/min — ABNORMAL LOW (ref >60)
GLUCOSE: 93 mg/dL (ref 65–99)
Phosphorus: 4.9 mg/dL — ABNORMAL HIGH (ref 2.5–4.6)
Potassium: 5.6 mmol/L — ABNORMAL HIGH (ref 3.5–5.1)
SODIUM: 139 mmol/L (ref 135–145)

## 2016-04-03 NOTE — Progress Notes (Signed)
Referring Physician(s): Dr Alan Ripper  Supervising Physician: Marybelle Killings  Patient Status:  Inpatient  Chief Complaint:  B hydronephrosis 9/11: Successful [place] and fluoroscopic guided placement of a bilateral PCNs with ends coiled and locked within the renal pelves.  Subjective:  Feeling better today Ambulating in halls per pt Eating normal diet Denies complaint  Allergies: Review of patient's allergies indicates no known allergies.  Medications: Prior to Admission medications   Medication Sig Start Date End Date Taking? Authorizing Provider  atorvastatin (LIPITOR) 20 MG tablet Take 20 mg by mouth daily.   Yes Historical Provider, MD  cloNIDine HCl (KAPVAY) 0.1 MG TB12 ER tablet Take 0.1 mg by mouth 2 (two) times daily.   Yes Historical Provider, MD  HYDROcodone-acetaminophen (NORCO/VICODIN) 5-325 MG tablet Take 1 tablet by mouth every 6 (six) hours as needed. 03/22/16  Yes Gay Filler Copland, MD  sodium bicarbonate 650 MG tablet Take 650 mg by mouth 4 (four) times daily.   Yes Historical Provider, MD  nitroGLYCERIN (NITROSTAT) 0.4 MG SL tablet Place 0.4 mg under the tongue every 5 (five) minutes as needed for chest pain.    Historical Provider, MD     Vital Signs: BP (!) 140/39 (BP Location: Right Arm)   Pulse 70   Temp 98.9 F (37.2 C) (Oral)   Resp 17   Ht 5\' 8"  (1.727 m)   Wt 123 lb 14.4 oz (56.2 kg)   SpO2 98%   BMI 18.84 kg/m   Physical Exam  Constitutional: He is oriented to person, place, and time.  Abdominal: Soft. There is no tenderness.  Musculoskeletal: Normal range of motion.  Neurological: He is alert and oriented to person, place, and time.  Skin: Skin is warm and dry.  Sites clean and dry NT No bleeding Output R 240 cc yesterday approx 100 in bag now---blood tinged  Output Left 580 cc yesterday 150 cc in bag Clear yellow  Nursing note and vitals reviewed.   Imaging: Ir Nephrostomy Placement Left  Result Date:  04/02/2016 INDICATION: History of prostate cancer with chronic suprapubic Foley catheter, now with acute on chronic elevation of serum creatinine with cross-sectional imaging demonstrated mild-to-moderate bilateral pelvicaliectasis. Request made for placement of bilateral percutaneous nephrostomy catheters for renal preservation purposes. EXAM: 1. ULTRASOUND GUIDANCE FOR PUNCTURE OF THE BILATERAL RENAL COLLECTING SYSTEMS 2. BILATERAL PERCUTANEOUS NEPHROSTOMY TUBE PLACEMENT. COMPARISON:  CT of the abdomen pelvis - 03/28/2016; renal ultrasound - 03/29/2016 MEDICATIONS: Ciprofloxacin 400 mg IV; The antibiotic was administered in an appropriate time frame prior to skin puncture. ANESTHESIA/SEDATION: Fentanyl 75 mcg IV; Versed 2 mg IV Total Moderate Sedation Time 25 minutes. CONTRAST:  A total of 20 mL Isovue 300 was administered into the renal collecting systems. FLUOROSCOPY TIME:  5 minutes 36 seconds (20 mGy) COMPLICATIONS: None immediate. PROCEDURE: The procedure, risks, benefits, and alternatives were explained to the patient. Questions regarding the procedure were encouraged and answered. The patient understands and consents to the procedure. A timeout was performed prior to the initiation of the procedure. The bilateral flanks were prepped with Betadine in a sterile fashion, and a sterile drape was applied covering the operative field. A sterile gown and sterile gloves were used for the procedure. Local anesthesia was provided with 1% Lidocaine with epinephrine. Ultrasound was used to localize the left kidney. Under direct ultrasound guidance, a 21 gauge needle was advanced into the renal collecting system. An ultrasound image documentation was performed. Access within the collecting system was confirmed with the efflux  of urine followed by contrast injection. Over a Nitrex wire, an Accustick set was advanced into the renal collecting system. Contrast injection was injected into the collecting system. Over a guide  wire, a 10-French percutaneous nephrostomy catheter was advanced into the collecting system where the coil was formed and locked. Contrast was injected and several sport radiographs were obtained in various obliquities confirming access. The catheter was secured at the skin with a Prolene retention suture and a gravity bag was placed. Attention was now paid towards placement of a right-sided percutaneous nephrostomy catheter. The identical procedure was performed ultimately allowing placement of a 10 French percutaneous nephrostomy catheter via a posterior inferior calyx with end coiled and locked within the right renal pelvis. The catheter was secured to skin with a Prolene retention suture and connected to a gravity bag. Dressings were placed. The patient tolerated procedure well without immediate postprocedural complication. FINDINGS: Ultrasound scanning demonstrates very mild dilatation of the bilateral renal collecting systems, unchanged to minimally improved compared to the 03/29/2016 examination. Under direct ultrasound guidance, posterior inferior calices were targeted bilaterally allowing placement of bilateral 10-French percutaneous nephrostomy catheter under intermittent fluoroscopic guidance. Contrast injection confirmed appropriate positioning. IMPRESSION: Successful ultrasound and fluoroscopic guided placement of bilateral 10 French PCNs. Electronically Signed   By: Sandi Mariscal M.D.   On: 04/02/2016 16:38   Ir Nephrostomy Placement Right  Result Date: 04/02/2016 INDICATION: History of prostate cancer with chronic suprapubic Foley catheter, now with acute on chronic elevation of serum creatinine with cross-sectional imaging demonstrated mild-to-moderate bilateral pelvicaliectasis. Request made for placement of bilateral percutaneous nephrostomy catheters for renal preservation purposes. EXAM: 1. ULTRASOUND GUIDANCE FOR PUNCTURE OF THE BILATERAL RENAL COLLECTING SYSTEMS 2. BILATERAL PERCUTANEOUS  NEPHROSTOMY TUBE PLACEMENT. COMPARISON:  CT of the abdomen pelvis - 03/28/2016; renal ultrasound - 03/29/2016 MEDICATIONS: Ciprofloxacin 400 mg IV; The antibiotic was administered in an appropriate time frame prior to skin puncture. ANESTHESIA/SEDATION: Fentanyl 75 mcg IV; Versed 2 mg IV Total Moderate Sedation Time 25 minutes. CONTRAST:  A total of 20 mL Isovue 300 was administered into the renal collecting systems. FLUOROSCOPY TIME:  5 minutes 36 seconds (20 mGy) COMPLICATIONS: None immediate. PROCEDURE: The procedure, risks, benefits, and alternatives were explained to the patient. Questions regarding the procedure were encouraged and answered. The patient understands and consents to the procedure. A timeout was performed prior to the initiation of the procedure. The bilateral flanks were prepped with Betadine in a sterile fashion, and a sterile drape was applied covering the operative field. A sterile gown and sterile gloves were used for the procedure. Local anesthesia was provided with 1% Lidocaine with epinephrine. Ultrasound was used to localize the left kidney. Under direct ultrasound guidance, a 21 gauge needle was advanced into the renal collecting system. An ultrasound image documentation was performed. Access within the collecting system was confirmed with the efflux of urine followed by contrast injection. Over a Nitrex wire, an Accustick set was advanced into the renal collecting system. Contrast injection was injected into the collecting system. Over a guide wire, a 10-French percutaneous nephrostomy catheter was advanced into the collecting system where the coil was formed and locked. Contrast was injected and several sport radiographs were obtained in various obliquities confirming access. The catheter was secured at the skin with a Prolene retention suture and a gravity bag was placed. Attention was now paid towards placement of a right-sided percutaneous nephrostomy catheter. The identical  procedure was performed ultimately allowing placement of a 10 French percutaneous nephrostomy catheter  via a posterior inferior calyx with end coiled and locked within the right renal pelvis. The catheter was secured to skin with a Prolene retention suture and connected to a gravity bag. Dressings were placed. The patient tolerated procedure well without immediate postprocedural complication. FINDINGS: Ultrasound scanning demonstrates very mild dilatation of the bilateral renal collecting systems, unchanged to minimally improved compared to the 03/29/2016 examination. Under direct ultrasound guidance, posterior inferior calices were targeted bilaterally allowing placement of bilateral 10-French percutaneous nephrostomy catheter under intermittent fluoroscopic guidance. Contrast injection confirmed appropriate positioning. IMPRESSION: Successful ultrasound and fluoroscopic guided placement of bilateral 10 French PCNs. Electronically Signed   By: Sandi Mariscal M.D.   On: 04/02/2016 16:38    Labs:  CBC:  Recent Labs  03/29/16 1113 03/30/16 0301 03/31/16 0307 04/01/16 0523 04/02/16 1232  WBC 13.4* 10.7* 12.2* 9.9  --   HGB 10.2* 8.9* 8.2* 9.0*  --   HCT 33.7* 28.8* 27.0* 29.2*  --   PLT 193 154 160 176 195    COAGS:  Recent Labs  03/28/16 2156 04/02/16 0619 04/02/16 1232  INR 1.09 >10.00* 1.15  APTT 42*  --  32    BMP:  Recent Labs  03/31/16 0307 04/01/16 0519 04/02/16 0619 04/03/16 0410  NA 137 141 138 139  K 5.4* 5.6* 5.9* 5.6*  CL 107 112* 115* 114*  CO2 22 19* 16* 17*  GLUCOSE 108* 89 80 93  BUN 66* 65* 67* 61*  CALCIUM 8.2* 8.9 8.6* 8.8*  CREATININE 3.39* 3.49* 3.55* 3.20*  GFRNONAA 15* 15* 14* 16*  GFRAA 17* 17* 17* 19*    LIVER FUNCTION TESTS:  Recent Labs  03/28/16 1007 03/30/16 0301 03/31/16 0307 04/01/16 0519 04/02/16 0619 04/03/16 0410  BILITOT 0.9 0.3  --   --   --   --   AST 38 36  --   --   --   --   ALT 62 53  --   --   --   --   ALKPHOS 61 48   --   --   --   --   PROT 7.2 5.8*  --   --   --   --   ALBUMIN 3.1* 2.3* 2.1* 2.3* 2.3* 2.3*    Assessment and Plan:  B PCNs in place Draining well Cr trending down Will follow Plan per Dr Diona Fanti  Electronically Signed: Monia Sabal A 04/03/2016, 12:49 PM   I spent a total of 15 Minutes at the the patient's bedside AND on the patient's hospital floor or unit, greater than 50% of which was counseling/coordinating care for B PCNs

## 2016-04-03 NOTE — Progress Notes (Signed)
Spoke with radiology yesterday and thanks for perc tubes Patient tolerating perc tubes well Urine also in Sp tube bag Vitals OK Cr 3.2  Will speak to primary team: my cell is [phone]

## 2016-04-03 NOTE — Progress Notes (Signed)
Physical Therapy Treatment Patient Details Name: Joseph Carrillo MRN: [id number] DOB: November 07, 1926 Today's Date: 04/03/2016    History of Present Illness Patient is an 82 yo male admitted 03/28/16 with abdominal pain.  Patient with hyperkalemia and acute on CKD.   PMH:  multiple falls, suprapubic catheter, CAD, CABG, CKD, anemia, GERD    PT Comments    Noting improved gait and activity tolerance; Joseph Carrillo was overall pleased with his walk and feeling better; he would benefit from daily hallway amb with staff  Follow Up Recommendations  SNF;Supervision/Assistance - 24 hour     Equipment Recommendations  3in1 (PT)    Recommendations for Other Services       Precautions / Restrictions Precautions Precautions: Fall Precaution Comments: h/o falls; now with bil nephrostomy tubes    Mobility  Bed Mobility Overal bed mobility: Needs Assistance Bed Mobility: Supine to Sit     Supine to sit: Min guard     General bed mobility comments: Assist for safety.  Increased time and use of rail.  Transfers Overall transfer level: Needs assistance Equipment used: Rolling walker (2 wheeled) Transfers: Sit to/from Stand Sit to Stand: Min guard         General transfer comment: Verbal cues for hand placement.  Minguard with physical contact for safety and steadiness  Ambulation/Gait Ambulation/Gait assistance: Min assist Ambulation Distance (Feet): 100 Feet Assistive device: Rolling walker (2 wheeled) Gait Pattern/deviations: Step-through pattern;Narrow base of support Gait velocity: decreased   General Gait Details: Cues for posture and to self-monitor for activity tolerance; continued narrow base of support, but no scissoring or stepping on own feet noted   Stairs            Wheelchair Mobility    Modified Rankin (Stroke Patients Only)       Balance     Sitting balance-Leahy Scale: Fair       Standing balance-Leahy Scale: Poor                       Cognition Arousal/Alertness: Awake/alert Behavior During Therapy: WFL for tasks assessed/performed Overall Cognitive Status: Within Functional Limits for tasks assessed                      Exercises      General Comments        Pertinent Vitals/Pain Pain Assessment: Faces Faces Pain Scale: Hurts little more Pain Location: R nephrostomy tube site Pain Descriptors / Indicators: Sore Pain Intervention(s): Monitored during session;Repositioned;Other (comment) (secured tubes)    Home Living                      Prior Function            PT Goals (current goals can now be found in the care plan section) Acute Rehab PT Goals Patient Stated Goal: To get stronger PT Goal Formulation: With patient/family Time For Goal Achievement: 04/14/16 Potential to Achieve Goals: Good Progress towards PT goals: Progressing toward goals    Frequency  Min 3X/week    PT Plan Current plan remains appropriate    Co-evaluation             End of Session   Activity Tolerance: Patient tolerated treatment well Patient left: in bed;with call bell/phone within reach     Time: 1137-1210 PT Time Calculation (min) (ACUTE ONLY): 33 min  Charges:  $Gait Training: 23-37 mins  G Codes:      Roney Marion Hamff 04/03/2016, 1:09 PM  Roney Marion, [place]  Acute Rehabilitation Services Pager [phone] Office [phone]

## 2016-04-03 NOTE — Care Management Important Message (Signed)
Important Message  Patient Details  Name: Joseph Carrillo MRN: [id number] Date of Birth: [birth year]   Medicare Important Message Given:  Yes    Chabeli Barsamian Montine Circle 04/03/2016, 12:58 PM

## 2016-04-03 NOTE — Progress Notes (Signed)
[facility]  PROGRESS NOTE  Joseph Carrillo [id number] DOB: June 05, 1927 DOA: 03/28/2016 PCP: [name], MD    Brief HPI:   Pt. with PMH of prostate cancer with chronic suprapubic catheter, coronary disease S/P CABG, chronic kidney disease; admitted on 03/28/2016, with complaint of fatigue, was found to have acute on chronic kidney disease with hyperkalemia. Currently further plan is continue monitoring of renal function with IV hydration.    Assessment/Plan:    1. Hyperkalemia-potassium is still elevated, potassium is 5.6,Improved from yesterday, likely from worsening renal function,Was given Kayexalate  with no improvement 2. Elevated PT/INR- Lab error, patient had elevated INR 10.0 yesterday, his repeat INR 1.15. 3. Acute kidney injury on CKD stage III- reviewed the records from [facility] in [place], patient's  creatinine as of 02/29/2016 was 2.0. Patient now has chronic obstructive uropathy, urology will decide further he would benefit from nephrostomy tubes. Reviewed ultrasound of kidneys done on 8/17 showed mild bilateral hydronephrosis of uncertain etiology. Renal function is improving after placement of nephrostomy tube. Today creatinine 3.20. Follow BMP in a.m. 4. Bilateral hydronephrosis with worsening renal function- status post bilateral nephrostomy tubes. 5. Lung nodule- patient will require noncontrast CT chest in 3-6 months as outpatient. 6. Chronic anemia- hemoglobin has improved to 9.0, from  8.2, likely from anemia of chronic kidney disease. Patient had EGD done on 02/24/2016 at [place] showed no significant abnormality. Colonoscopy was not performed due to advanced age. Patient's hemoglobin was 10.5 on 02/29/2016. We'll continue to monitor patient's hemoglobin in the hospital. 7. Abnormal UA- urine cultures x 2 growing only multiple bacterial colonies consistent with contamination.  8. Chest pain-patient  had mild elevation of troponin, cardiology was consulted. No intervention recommended at this time. Cardiology has signed off.   DVT prophylaxis: Heparin Code Status: Full code Family Communication: Discussed with patient's daughter at bedside  Disposition Plan: Skilled nursing facility   Consultants:  Urology  Cardiology  Procedures:  None  Antibiotics:  None   Subjective   Patient seen and examined, Status post bilateral nephrostomy tube placement.  Objective    Objective: Vitals:   04/02/16 1730 04/02/16 2120 04/03/16 0525 04/03/16 1105  BP: (!) 156/38 (!) 142/48 (!) 140/39   Pulse: (!) 52 64 (!) 51 70  Resp: 17 17 17    Temp: 97.9 F (36.6 C) 98.3 F (36.8 C) 98.9 F (37.2 C)   TempSrc: Oral Oral Oral   SpO2: 100% 100% 98%   Weight:  56.2 kg (123 lb 14.4 oz)    Height:        Intake/Output Summary (Last 24 hours) at 04/03/16 1605 Last data filed at 04/03/16 1544  Gross per 24 hour  Intake             2450 ml  Output             1795 ml  Net              655 ml   Filed Weights   03/30/16 2102 04/01/16 2153 04/02/16 2120  Weight: 54.1 kg (119 lb 4.3 oz) 55 kg (121 lb 4.1 oz) 56.2 kg (123 lb 14.4 oz)    Examination:  General exam: Appears calm and comfortable  Respiratory system: Clear to auscultation. Respiratory effort normal. Cardiovascular system: S1 & S2 heard, RRR. No JVD, murmurs, rubs, gallops or clicks. No pedal edema. Gastrointestinal system: Abdomen is nondistended, soft and nontender. No organomegaly or masses felt. Normal bowel sounds  heard. Central nervous system: Alert and oriented. No focal neurological deficits. Extremities: Symmetric 5 x 5 power. Skin: No rashes, lesions or ulcers Psychiatry: Judgement and insight appear normal. Mood & affect appropriate.    Data Reviewed: I have personally reviewed following labs and imaging studies Basic Metabolic Panel:  Recent Labs Lab 03/29/16 1113  03/30/16 0301 03/31/16 0307  04/01/16 0519 04/02/16 0619 04/03/16 0410  NA 144  < > 135 137 141 138 139  K 5.9*  < > 5.2* 5.4* 5.6* 5.9* 5.6*  CL 112*  < > 104 107 112* 115* 114*  CO2 22  < > 22 22 19* 16* 17*  GLUCOSE 116*  < > 146* 108* 89 80 93  BUN 68*  < > 60* 66* 65* 67* 61*  CREATININE 3.42*  < > 3.28* 3.39* 3.49* 3.55* 3.20*  CALCIUM 9.7  < > 8.3* 8.2* 8.9 8.6* 8.8*  MG 1.5*  --  1.7 1.7  --   --   --   PHOS  --   --   --  5.0* 5.6* 5.1* 4.9*  < > = values in this interval not displayed. Liver Function Tests:  Recent Labs Lab 03/28/16 1007 03/30/16 0301 03/31/16 0307 04/01/16 0519 04/02/16 0619 04/03/16 0410  AST 38 36  --   --   --   --   ALT 62 53  --   --   --   --   ALKPHOS 61 48  --   --   --   --   BILITOT 0.9 0.3  --   --   --   --   PROT 7.2 5.8*  --   --   --   --   ALBUMIN 3.1* 2.3* 2.1* 2.3* 2.3* 2.3*   No results for input(s): LIPASE, AMYLASE in the last 168 hours. No results for input(s): AMMONIA in the last 168 hours. CBC:  Recent Labs Lab 03/28/16 1007 03/29/16 1113 03/30/16 0301 03/31/16 0307 04/01/16 0523 04/02/16 1232  WBC 15.0* 13.4* 10.7* 12.2* 9.9  --   NEUTROABS 12.8* 11.6* 8.9* 10.1*  --   --   HGB 9.3* 10.2* 8.9* 8.2* 9.0*  --   HCT 30.1* 33.7* 28.8* 27.0* 29.2*  --   MCV 89.3 91.3 91.1 91.5 91.8  --   PLT 188 193 154 160 176 195   Cardiac Enzymes:  Recent Labs Lab 03/28/16 2156 03/29/16 0212 03/30/16 0301 03/30/16 0857  TROPONINI 0.03* 0.04* 0.03* 0.03*   BNP (last 3 results) No results for input(s): BNP in the last 8760 hours.  ProBNP (last 3 results) No results for input(s): PROBNP in the last 8760 hours.  CBG: No results for input(s): GLUCAP in the last 168 hours.  Recent Results (from the past 240 hour(s))  Culture, Urine     Status: Abnormal   Collection Time: 03/28/16  9:49 AM  Result Value Ref Range Status   Specimen Description URINE, RANDOM  Final   Special Requests NONE  Final   Culture MULTIPLE SPECIES PRESENT, SUGGEST  RECOLLECTION (A)  Final   Report Status 03/29/2016 FINAL  Final  MRSA PCR Screening     Status: None   Collection Time: 03/28/16  4:00 PM  Result Value Ref Range Status   MRSA by PCR NEGATIVE NEGATIVE Final    Comment:        The GeneXpert MRSA Assay (FDA approved for NASAL specimens only), is one component of a comprehensive MRSA colonization surveillance program. It  is not intended to diagnose MRSA infection nor to guide or monitor treatment for MRSA infections.   Culture, Urine     Status: Abnormal   Collection Time: 03/29/16  7:53 PM  Result Value Ref Range Status   Specimen Description URINE, SUPRAPUBIC  Final   Special Requests NONE  Final   Culture MULTIPLE SPECIES PRESENT, SUGGEST RECOLLECTION (A)  Final   Report Status 03/31/2016 FINAL  Final     Studies: Ir Nephrostomy Placement Left  Result Date: 04/02/2016 INDICATION: History of prostate cancer with chronic suprapubic Foley catheter, now with acute on chronic elevation of serum creatinine with cross-sectional imaging demonstrated mild-to-moderate bilateral pelvicaliectasis. Request made for placement of bilateral percutaneous nephrostomy catheters for renal preservation purposes. EXAM: 1. ULTRASOUND GUIDANCE FOR PUNCTURE OF THE BILATERAL RENAL COLLECTING SYSTEMS 2. BILATERAL PERCUTANEOUS NEPHROSTOMY TUBE PLACEMENT. COMPARISON:  CT of the abdomen pelvis - 03/28/2016; renal ultrasound - 03/29/2016 MEDICATIONS: Ciprofloxacin 400 mg IV; The antibiotic was administered in an appropriate time frame prior to skin puncture. ANESTHESIA/SEDATION: Fentanyl 75 mcg IV; Versed 2 mg IV Total Moderate Sedation Time 25 minutes. CONTRAST:  A total of 20 mL Isovue 300 was administered into the renal collecting systems. FLUOROSCOPY TIME:  5 minutes 36 seconds (20 mGy) COMPLICATIONS: None immediate. PROCEDURE: The procedure, risks, benefits, and alternatives were explained to the patient. Questions regarding the procedure were encouraged and  answered. The patient understands and consents to the procedure. A timeout was performed prior to the initiation of the procedure. The bilateral flanks were prepped with Betadine in a sterile fashion, and a sterile drape was applied covering the operative field. A sterile gown and sterile gloves were used for the procedure. Local anesthesia was provided with 1% Lidocaine with epinephrine. Ultrasound was used to localize the left kidney. Under direct ultrasound guidance, a 21 gauge needle was advanced into the renal collecting system. An ultrasound image documentation was performed. Access within the collecting system was confirmed with the efflux of urine followed by contrast injection. Over a Nitrex wire, an Accustick set was advanced into the renal collecting system. Contrast injection was injected into the collecting system. Over a guide wire, a 10-French percutaneous nephrostomy catheter was advanced into the collecting system where the coil was formed and locked. Contrast was injected and several sport radiographs were obtained in various obliquities confirming access. The catheter was secured at the skin with a Prolene retention suture and a gravity bag was placed. Attention was now paid towards placement of a right-sided percutaneous nephrostomy catheter. The identical procedure was performed ultimately allowing placement of a 10 French percutaneous nephrostomy catheter via a posterior inferior calyx with end coiled and locked within the right renal pelvis. The catheter was secured to skin with a Prolene retention suture and connected to a gravity bag. Dressings were placed. The patient tolerated procedure well without immediate postprocedural complication. FINDINGS: Ultrasound scanning demonstrates very mild dilatation of the bilateral renal collecting systems, unchanged to minimally improved compared to the 03/29/2016 examination. Under direct ultrasound guidance, posterior inferior calices were targeted  bilaterally allowing placement of bilateral 10-French percutaneous nephrostomy catheter under intermittent fluoroscopic guidance. Contrast injection confirmed appropriate positioning. IMPRESSION: Successful ultrasound and fluoroscopic guided placement of bilateral 10 French PCNs. Electronically Signed   By: Sandi Mariscal M.D.   On: 04/02/2016 16:38   Ir Nephrostomy Placement Right  Result Date: 04/02/2016 INDICATION: History of prostate cancer with chronic suprapubic Foley catheter, now with acute on chronic elevation of serum creatinine with cross-sectional imaging demonstrated  mild-to-moderate bilateral pelvicaliectasis. Request made for placement of bilateral percutaneous nephrostomy catheters for renal preservation purposes. EXAM: 1. ULTRASOUND GUIDANCE FOR PUNCTURE OF THE BILATERAL RENAL COLLECTING SYSTEMS 2. BILATERAL PERCUTANEOUS NEPHROSTOMY TUBE PLACEMENT. COMPARISON:  CT of the abdomen pelvis - 03/28/2016; renal ultrasound - 03/29/2016 MEDICATIONS: Ciprofloxacin 400 mg IV; The antibiotic was administered in an appropriate time frame prior to skin puncture. ANESTHESIA/SEDATION: Fentanyl 75 mcg IV; Versed 2 mg IV Total Moderate Sedation Time 25 minutes. CONTRAST:  A total of 20 mL Isovue 300 was administered into the renal collecting systems. FLUOROSCOPY TIME:  5 minutes 36 seconds (20 mGy) COMPLICATIONS: None immediate. PROCEDURE: The procedure, risks, benefits, and alternatives were explained to the patient. Questions regarding the procedure were encouraged and answered. The patient understands and consents to the procedure. A timeout was performed prior to the initiation of the procedure. The bilateral flanks were prepped with Betadine in a sterile fashion, and a sterile drape was applied covering the operative field. A sterile gown and sterile gloves were used for the procedure. Local anesthesia was provided with 1% Lidocaine with epinephrine. Ultrasound was used to localize the left kidney. Under  direct ultrasound guidance, a 21 gauge needle was advanced into the renal collecting system. An ultrasound image documentation was performed. Access within the collecting system was confirmed with the efflux of urine followed by contrast injection. Over a Nitrex wire, an Accustick set was advanced into the renal collecting system. Contrast injection was injected into the collecting system. Over a guide wire, a 10-French percutaneous nephrostomy catheter was advanced into the collecting system where the coil was formed and locked. Contrast was injected and several sport radiographs were obtained in various obliquities confirming access. The catheter was secured at the skin with a Prolene retention suture and a gravity bag was placed. Attention was now paid towards placement of a right-sided percutaneous nephrostomy catheter. The identical procedure was performed ultimately allowing placement of a 10 French percutaneous nephrostomy catheter via a posterior inferior calyx with end coiled and locked within the right renal pelvis. The catheter was secured to skin with a Prolene retention suture and connected to a gravity bag. Dressings were placed. The patient tolerated procedure well without immediate postprocedural complication. FINDINGS: Ultrasound scanning demonstrates very mild dilatation of the bilateral renal collecting systems, unchanged to minimally improved compared to the 03/29/2016 examination. Under direct ultrasound guidance, posterior inferior calices were targeted bilaterally allowing placement of bilateral 10-French percutaneous nephrostomy catheter under intermittent fluoroscopic guidance. Contrast injection confirmed appropriate positioning. IMPRESSION: Successful ultrasound and fluoroscopic guided placement of bilateral 10 French PCNs. Electronically Signed   By: Sandi Mariscal M.D.   On: 04/02/2016 16:38    Scheduled Meds: . atorvastatin  20 mg Oral Daily  . cefTRIAXone (ROCEPHIN)  IV  1 g  Intravenous Q24H  . cloNIDine HCl  0.1 mg Oral BID  . feeding supplement  1 Container Oral TID BM  . heparin subcutaneous  5,000 Units Subcutaneous Q8H  . mouth rinse  15 mL Mouth Rinse BID  . metoprolol tartrate  12.5 mg Oral BID  .  morphine injection  2 mg Intravenous Once  . sodium chloride flush  3 mL Intravenous Q12H   Continuous Infusions: . sodium chloride 75 mL/hr at 04/03/16 0449       Time spent: 25 min    [facility] Pager [phone]. If 7PM-7AM, please contact night-coverage at [email or web address], Office  [phone]  password [email or web address] 04/03/2016, 4:05 PM  LOS: 6 days

## 2016-04-04 DIAGNOSIS — D500 Iron deficiency anemia secondary to blood loss (chronic): Secondary | ICD-10-CM

## 2016-04-04 DIAGNOSIS — N185 Chronic kidney disease, stage 5: Secondary | ICD-10-CM

## 2016-04-04 LAB — RENAL FUNCTION PANEL
ALBUMIN: 2.4 g/dL — AB (ref 3.5–5.0)
ANION GAP: 7 (ref 5–15)
BUN: 48 mg/dL — AB (ref 6–20)
CHLORIDE: 120 mmol/L — AB (ref 101–111)
CO2: 14 mmol/L — ABNORMAL LOW (ref 22–32)
Calcium: 8.5 mg/dL — ABNORMAL LOW (ref 8.9–10.3)
Creatinine, Ser: 2.6 mg/dL — ABNORMAL HIGH (ref 0.61–1.24)
GFR calc Af Amer: 24 mL/min — ABNORMAL LOW (ref >60)
GFR calc non Af Amer: 21 mL/min — ABNORMAL LOW (ref >60)
GLUCOSE: 76 mg/dL (ref 65–99)
PHOSPHORUS: 3.8 mg/dL (ref 2.5–4.6)
POTASSIUM: 5.5 mmol/L — AB (ref 3.5–5.1)
Sodium: 141 mmol/L (ref 135–145)

## 2016-04-04 MED ORDER — SODIUM POLYSTYRENE SULFONATE 15 GM/60ML PO SUSP
45.0000 g | Freq: Once | ORAL | Status: AC
Start: 2016-04-04 — End: 2016-04-04
  Administered 2016-04-04: 45 g via ORAL
  Filled 2016-04-04: qty 180

## 2016-04-04 NOTE — Progress Notes (Signed)
[facility]  PROGRESS NOTE  Joseph Carrillo [id number] DOB: Jan 21, 1927 DOA: 03/28/2016 PCP: [name], MD    Brief HPI:   Pt. with PMH of prostate cancer with chronic suprapubic catheter, coronary disease S/P CABG, chronic kidney disease; admitted on 03/28/2016, with complaint of fatigue, was found to have acute on chronic kidney disease with hyperkalemia. Currently further plan is continue monitoring of renal function with IV hydration.    Assessment/Plan:    1. Hyperkalemia-potassium is still elevated, potassium is 5.5,Improved from yesterday, likely from worsening renal function,Was given Kayexalate  with no improvement, Will repeat Kayexalate 2. Elevated PT/INR- Lab error, patient had elevated INR 10.0 yesterday, his repeat INR 1.15. 3. Acute kidney injury on CKD stage III- reviewed the records from [facility] in [place], patient's  creatinine as of 02/29/2016 was 2.0. Patient now has chronic obstructive uropathy, urology will decide further he would benefit from nephrostomy tubes. Reviewed ultrasound of kidneys done on 8/17 showed mild bilateral hydronephrosis of uncertain etiology. Renal function is improving after placement of nephrostomy tube. Today creatinine 2.66. Follow BMP in a.m. Plan per Dr Diona Fanti, urology following 4. Bilateral hydronephrosis with worsening renal function- status post bilateral nephrostomy tubes. 5. Lung nodule- patient will require noncontrast CT chest in 3-6 months as outpatient. 6. Chronic anemia- hemoglobin has improved to 9.0, from  8.2, likely from anemia of chronic kidney disease. Patient had EGD done on 02/24/2016 at [facility] showed no significant abnormality. Colonoscopy was not performed due to advanced age. Patient's hemoglobin was 10.5 on 02/29/2016. We'll continue to monitor patient's hemoglobin in the hospital. 7. Abnormal UA- urine cultures x 2 growing only multiple  bacterial colonies consistent with contamination.  8. Chest pain-patient had mild elevation of troponin, cardiology was consulted. No intervention recommended at this time. Cardiology has signed off.   DVT prophylaxis: Heparin Code Status: Full code Family Communication: Discussed with patient's daughter at bedside  Disposition Plan: Skilled nursing facility   Consultants:  Urology  Cardiology  Procedures:  None  Antibiotics:  None   Subjective   Patient seen and examined, Status post bilateral nephrostomy tube placement.  Objective    Objective: Vitals:   04/03/16 2145 04/04/16 0526 04/04/16 0640 04/04/16 1001  BP: (!) 142/32 124/63 (!) 152/38 (!) 154/48  Pulse: (!) 57 90 (!) 48 64  Resp: 16 16 18 18   Temp: 98.2 F (36.8 C) 98.2 F (36.8 C) 98.4 F (36.9 C) 98.4 F (36.9 C)  TempSrc: Oral Oral Oral Oral  SpO2: 100% 100% 100% 100%  Weight:      Height:        Intake/Output Summary (Last 24 hours) at 04/04/16 1217 Last data filed at 04/04/16 1100  Gross per 24 hour  Intake             1670 ml  Output             2595 ml  Net             -925 ml   Filed Weights   03/30/16 2102 04/01/16 2153 04/02/16 2120  Weight: 54.1 kg (119 lb 4.3 oz) 55 kg (121 lb 4.1 oz) 56.2 kg (123 lb 14.4 oz)    Examination:  General exam: Appears calm and comfortable  Respiratory system: Clear to auscultation. Respiratory effort normal. Cardiovascular system: S1 & S2 heard, RRR. No JVD, murmurs, rubs, gallops or clicks. No pedal edema. Gastrointestinal system: Abdomen is nondistended, soft and nontender. No organomegaly  or masses felt. Normal bowel sounds heard. Central nervous system: Alert and oriented. No focal neurological deficits. Extremities: Symmetric 5 x 5 power. Skin: No rashes, lesions or ulcers Psychiatry: Judgement and insight appear normal. Mood & affect appropriate.    Data Reviewed: I have personally reviewed following labs and imaging studies Basic  Metabolic Panel:  Recent Labs Lab 03/29/16 1113  03/30/16 0301 03/31/16 0307 04/01/16 0519 04/02/16 0619 04/03/16 0410 04/04/16 0759  NA 144  < > 135 137 141 138 139 141  K 5.9*  < > 5.2* 5.4* 5.6* 5.9* 5.6* 5.5*  CL 112*  < > 104 107 112* 115* 114* 120*  CO2 22  < > 22 22 19* 16* 17* 14*  GLUCOSE 116*  < > 146* 108* 89 80 93 76  BUN 68*  < > 60* 66* 65* 67* 61* 48*  CREATININE 3.42*  < > 3.28* 3.39* 3.49* 3.55* 3.20* 2.60*  CALCIUM 9.7  < > 8.3* 8.2* 8.9 8.6* 8.8* 8.5*  MG 1.5*  --  1.7 1.7  --   --   --   --   PHOS  --   --   --  5.0* 5.6* 5.1* 4.9* 3.8  < > = values in this interval not displayed. Liver Function Tests:  Recent Labs Lab 03/30/16 0301 03/31/16 0307 04/01/16 0519 04/02/16 0619 04/03/16 0410 04/04/16 0759  AST 36  --   --   --   --   --   ALT 53  --   --   --   --   --   ALKPHOS 48  --   --   --   --   --   BILITOT 0.3  --   --   --   --   --   PROT 5.8*  --   --   --   --   --   ALBUMIN 2.3* 2.1* 2.3* 2.3* 2.3* 2.4*   No results for input(s): LIPASE, AMYLASE in the last 168 hours. No results for input(s): AMMONIA in the last 168 hours. CBC:  Recent Labs Lab 03/29/16 1113 03/30/16 0301 03/31/16 0307 04/01/16 0523 04/02/16 1232  WBC 13.4* 10.7* 12.2* 9.9  --   NEUTROABS 11.6* 8.9* 10.1*  --   --   HGB 10.2* 8.9* 8.2* 9.0*  --   HCT 33.7* 28.8* 27.0* 29.2*  --   MCV 91.3 91.1 91.5 91.8  --   PLT 193 154 160 176 195   Cardiac Enzymes:  Recent Labs Lab 03/28/16 2156 03/29/16 0212 03/30/16 0301 03/30/16 0857  TROPONINI 0.03* 0.04* 0.03* 0.03*   BNP (last 3 results) No results for input(s): BNP in the last 8760 hours.  ProBNP (last 3 results) No results for input(s): PROBNP in the last 8760 hours.  CBG: No results for input(s): GLUCAP in the last 168 hours.  Recent Results (from the past 240 hour(s))  Culture, Urine     Status: Abnormal   Collection Time: 03/28/16  9:49 AM  Result Value Ref Range Status   Specimen Description  URINE, RANDOM  Final   Special Requests NONE  Final   Culture MULTIPLE SPECIES PRESENT, SUGGEST RECOLLECTION (A)  Final   Report Status 03/29/2016 FINAL  Final  MRSA PCR Screening     Status: None   Collection Time: 03/28/16  4:00 PM  Result Value Ref Range Status   MRSA by PCR NEGATIVE NEGATIVE Final    Comment:  The GeneXpert MRSA Assay (FDA approved for NASAL specimens only), is one component of a comprehensive MRSA colonization surveillance program. It is not intended to diagnose MRSA infection nor to guide or monitor treatment for MRSA infections.   Culture, Urine     Status: Abnormal   Collection Time: 03/29/16  7:53 PM  Result Value Ref Range Status   Specimen Description URINE, SUPRAPUBIC  Final   Special Requests NONE  Final   Culture MULTIPLE SPECIES PRESENT, SUGGEST RECOLLECTION (A)  Final   Report Status 03/31/2016 FINAL  Final     Studies: Ir Nephrostomy Placement Left  Result Date: 04/02/2016 INDICATION: History of prostate cancer with chronic suprapubic Foley catheter, now with acute on chronic elevation of serum creatinine with cross-sectional imaging demonstrated mild-to-moderate bilateral pelvicaliectasis. Request made for placement of bilateral percutaneous nephrostomy catheters for renal preservation purposes. EXAM: 1. ULTRASOUND GUIDANCE FOR PUNCTURE OF THE BILATERAL RENAL COLLECTING SYSTEMS 2. BILATERAL PERCUTANEOUS NEPHROSTOMY TUBE PLACEMENT. COMPARISON:  CT of the abdomen pelvis - 03/28/2016; renal ultrasound - 03/29/2016 MEDICATIONS: Ciprofloxacin 400 mg IV; The antibiotic was administered in an appropriate time frame prior to skin puncture. ANESTHESIA/SEDATION: Fentanyl 75 mcg IV; Versed 2 mg IV Total Moderate Sedation Time 25 minutes. CONTRAST:  A total of 20 mL Isovue 300 was administered into the renal collecting systems. FLUOROSCOPY TIME:  5 minutes 36 seconds (20 mGy) COMPLICATIONS: None immediate. PROCEDURE: The procedure, risks, benefits, and  alternatives were explained to the patient. Questions regarding the procedure were encouraged and answered. The patient understands and consents to the procedure. A timeout was performed prior to the initiation of the procedure. The bilateral flanks were prepped with Betadine in a sterile fashion, and a sterile drape was applied covering the operative field. A sterile gown and sterile gloves were used for the procedure. Local anesthesia was provided with 1% Lidocaine with epinephrine. Ultrasound was used to localize the left kidney. Under direct ultrasound guidance, a 21 gauge needle was advanced into the renal collecting system. An ultrasound image documentation was performed. Access within the collecting system was confirmed with the efflux of urine followed by contrast injection. Over a Nitrex wire, an Accustick set was advanced into the renal collecting system. Contrast injection was injected into the collecting system. Over a guide wire, a 10-French percutaneous nephrostomy catheter was advanced into the collecting system where the coil was formed and locked. Contrast was injected and several sport radiographs were obtained in various obliquities confirming access. The catheter was secured at the skin with a Prolene retention suture and a gravity bag was placed. Attention was now paid towards placement of a right-sided percutaneous nephrostomy catheter. The identical procedure was performed ultimately allowing placement of a 10 French percutaneous nephrostomy catheter via a posterior inferior calyx with end coiled and locked within the right renal pelvis. The catheter was secured to skin with a Prolene retention suture and connected to a gravity bag. Dressings were placed. The patient tolerated procedure well without immediate postprocedural complication. FINDINGS: Ultrasound scanning demonstrates very mild dilatation of the bilateral renal collecting systems, unchanged to minimally improved compared to the  03/29/2016 examination. Under direct ultrasound guidance, posterior inferior calices were targeted bilaterally allowing placement of bilateral 10-French percutaneous nephrostomy catheter under intermittent fluoroscopic guidance. Contrast injection confirmed appropriate positioning. IMPRESSION: Successful ultrasound and fluoroscopic guided placement of bilateral 10 French PCNs. Electronically Signed   By: Sandi Mariscal M.D.   On: 04/02/2016 16:38   Ir Nephrostomy Placement Right  Result Date: 04/02/2016 INDICATION: History  of prostate cancer with chronic suprapubic Foley catheter, now with acute on chronic elevation of serum creatinine with cross-sectional imaging demonstrated mild-to-moderate bilateral pelvicaliectasis. Request made for placement of bilateral percutaneous nephrostomy catheters for renal preservation purposes. EXAM: 1. ULTRASOUND GUIDANCE FOR PUNCTURE OF THE BILATERAL RENAL COLLECTING SYSTEMS 2. BILATERAL PERCUTANEOUS NEPHROSTOMY TUBE PLACEMENT. COMPARISON:  CT of the abdomen pelvis - 03/28/2016; renal ultrasound - 03/29/2016 MEDICATIONS: Ciprofloxacin 400 mg IV; The antibiotic was administered in an appropriate time frame prior to skin puncture. ANESTHESIA/SEDATION: Fentanyl 75 mcg IV; Versed 2 mg IV Total Moderate Sedation Time 25 minutes. CONTRAST:  A total of 20 mL Isovue 300 was administered into the renal collecting systems. FLUOROSCOPY TIME:  5 minutes 36 seconds (20 mGy) COMPLICATIONS: None immediate. PROCEDURE: The procedure, risks, benefits, and alternatives were explained to the patient. Questions regarding the procedure were encouraged and answered. The patient understands and consents to the procedure. A timeout was performed prior to the initiation of the procedure. The bilateral flanks were prepped with Betadine in a sterile fashion, and a sterile drape was applied covering the operative field. A sterile gown and sterile gloves were used for the procedure. Local anesthesia was  provided with 1% Lidocaine with epinephrine. Ultrasound was used to localize the left kidney. Under direct ultrasound guidance, a 21 gauge needle was advanced into the renal collecting system. An ultrasound image documentation was performed. Access within the collecting system was confirmed with the efflux of urine followed by contrast injection. Over a Nitrex wire, an Accustick set was advanced into the renal collecting system. Contrast injection was injected into the collecting system. Over a guide wire, a 10-French percutaneous nephrostomy catheter was advanced into the collecting system where the coil was formed and locked. Contrast was injected and several sport radiographs were obtained in various obliquities confirming access. The catheter was secured at the skin with a Prolene retention suture and a gravity bag was placed. Attention was now paid towards placement of a right-sided percutaneous nephrostomy catheter. The identical procedure was performed ultimately allowing placement of a 10 French percutaneous nephrostomy catheter via a posterior inferior calyx with end coiled and locked within the right renal pelvis. The catheter was secured to skin with a Prolene retention suture and connected to a gravity bag. Dressings were placed. The patient tolerated procedure well without immediate postprocedural complication. FINDINGS: Ultrasound scanning demonstrates very mild dilatation of the bilateral renal collecting systems, unchanged to minimally improved compared to the 03/29/2016 examination. Under direct ultrasound guidance, posterior inferior calices were targeted bilaterally allowing placement of bilateral 10-French percutaneous nephrostomy catheter under intermittent fluoroscopic guidance. Contrast injection confirmed appropriate positioning. IMPRESSION: Successful ultrasound and fluoroscopic guided placement of bilateral 10 French PCNs. Electronically Signed   By: Sandi Mariscal M.D.   On: 04/02/2016 16:38     Scheduled Meds: . atorvastatin  20 mg Oral Daily  . cefTRIAXone (ROCEPHIN)  IV  1 g Intravenous Q24H  . cloNIDine HCl  0.1 mg Oral BID  . feeding supplement  1 Container Oral TID BM  . heparin subcutaneous  5,000 Units Subcutaneous Q8H  . mouth rinse  15 mL Mouth Rinse BID  . metoprolol tartrate  12.5 mg Oral BID  .  morphine injection  2 mg Intravenous Once  . sodium chloride flush  3 mL Intravenous Q12H   Continuous Infusions: . sodium chloride 75 mL/hr at 04/04/16 0200       Time spent: 25 min    [facility] Pager [phone]. If  7PM-7AM, please contact night-coverage at [email or web address], Office  [phone]  password [email or web address] 04/04/2016, 12:17 PM  LOS: 7 days

## 2016-04-04 NOTE — Progress Notes (Signed)
Physical Therapy Treatment Patient Details Name: Joseph Carrillo MRN: [id number] DOB: 10/31/1926 Today's Date: 04/04/2016    History of Present Illness Patient is an 82 yo male admitted 03/28/16 with abdominal pain.  Patient with hyperkalemia and acute on CKD.   PMH:  multiple falls, suprapubic catheter, CAD, CABG, CKD, anemia, GERD    PT Comments    Continues to be motivated to walk, less distance today due to soreness and tiredness; We discussed spending some time OOB in the chair to combat the deleterious effects of bedrest; He ultimately declined the chair today; he does not like the chair -- will consider getting up to the chair if it is quite padded and moved away from the window  Follow Up Recommendations  SNF;Supervision/Assistance - 24 hour     Equipment Recommendations  3in1 (PT)    Recommendations for Other Services       Precautions / Restrictions Precautions Precautions: Fall Precaution Comments: h/o falls; now with bil nephrostomy tubes    Mobility  Bed Mobility Overal bed mobility: Needs Assistance Bed Mobility: Supine to Sit     Supine to sit: Min guard     General bed mobility comments: Assist for safety.  Increased time and use of rail.  Transfers Overall transfer level: Needs assistance Equipment used: Rolling walker (2 wheeled) Transfers: Sit to/from Stand Sit to Stand: Min guard         General transfer comment: Verbal cues for hand placement.  Minguard with physical contact for safety and steadiness  Ambulation/Gait Ambulation/Gait assistance: Min guard Ambulation Distance (Feet): 80 Feet Assistive device: Rolling walker (2 wheeled) Gait Pattern/deviations: Step-through pattern Gait velocity: decreased   General Gait Details: Cues for posture and to self-monitor for activity tolerance; dependent on UE support from [employer]            Wheelchair Mobility    Modified Rankin (Stroke Patients Only)       Balance     Sitting  balance-Leahy Scale: Fair       Standing balance-Leahy Scale: Poor                      Cognition Arousal/Alertness: Awake/alert Behavior During Therapy: WFL for tasks assessed/performed Overall Cognitive Status: Within Functional Limits for tasks assessed                      Exercises      General Comments        Pertinent Vitals/Pain Pain Assessment: Faces Faces Pain Scale: Hurts little more Pain Location: Back pain at nephrostomy tube sites Pain Descriptors / Indicators: Sore Pain Intervention(s): Monitored during session    Home Living                      Prior Function            PT Goals (current goals can now be found in the care plan section) Acute Rehab PT Goals Patient Stated Goal: To get stronger PT Goal Formulation: With patient/family Time For Goal Achievement: 04/14/16 Potential to Achieve Goals: Good Progress towards PT goals: Progressing toward goals (slowly, limited by being "sore" and "tired" but still very much wanting to walk)    Frequency  Min 3X/week    PT Plan Current plan remains appropriate    Co-evaluation             End of Session   Activity Tolerance: Patient tolerated treatment well  Patient left: in bed;with call bell/phone within reach     Time: 1532-1559 PT Time Calculation (min) (ACUTE ONLY): 27 min  Charges:  $Gait Training: 23-37 mins                    G Codes:      Roney Marion Hamff 04/04/2016, 5:07 PM  Roney Marion, [place] Pager [phone] Office [phone]

## 2016-04-04 NOTE — NC FL2 (Signed)
[place] MEDICAID FL2 LEVEL OF CARE SCREENING TOOL     IDENTIFICATION  Patient Name: Joseph Carrillo Birthdate: July 07, 1927 Sex: male Admission Date (Current Location): 03/28/2016  [facility] and [place] Number:  Herbalist and Address:  [facility], [place] [address] [place], [place] [address]      Provider Number: [id number]  Attending Physician Name and Address:  Reyne Dumas, MD  Relative Name and Phone Number:       Current Level of Care: Hospital Recommended Level of Care: [place] Prior Approval Number:    Date Approved/Denied:   PASRR Number: [id number]  Discharge Plan: SNF    Current Diagnoses: Patient Active Problem List   Diagnosis Date Noted  . Protein-calorie malnutrition, severe 03/30/2016  . Renal failure (ARF), acute on chronic (HCC)   . Hyperkalemia 03/28/2016  . CKD (chronic kidney disease) 03/28/2016  . Suprapubic catheter ([place]) 03/28/2016  . UTI (urinary tract infection) 03/28/2016  . Leukocytosis 03/28/2016  . Anemia 03/28/2016  . History of urostomy 03/22/2016  . Colostomy in place [facility]) 03/22/2016  . Underweight 03/22/2016  . Prostate cancer ([place]) 03/22/2016  . Abdominal pain, chronic, epigastric 03/22/2016  . Physical debility 03/22/2016    Orientation RESPIRATION BLADDER Height & Weight     Self  Normal Continent Weight: 123 lb 14.4 oz (56.2 kg) Height:  5\' 8"  (172.7 cm)  BEHAVIORAL SYMPTOMS/MOOD NEUROLOGICAL BOWEL NUTRITION STATUS      Continent    AMBULATORY STATUS COMMUNICATION OF NEEDS Skin   Limited Assist Verbally Normal                       Personal Care Assistance Level of Assistance  Dressing, Bathing Bathing Assistance: Limited assistance   Dressing Assistance: Limited assistance     Functional Limitations Info             SPECIAL CARE FACTORS FREQUENCY  PT (By licensed PT)     PT Frequency: daily OT Frequency: daily            Contractures  Contractures Info: Not present    Additional Factors Info                  Current Medications (04/04/2016):  This is the current hospital active medication list Current Facility-Administered Medications  Medication Dose Route Frequency Provider Last Rate Last Dose  . 0.9 %  sodium chloride infusion   Intravenous Continuous Lavina Hamman, MD 75 mL/hr at 04/04/16 0200    . albuterol (PROVENTIL) (2.5 MG/3ML) 0.083% nebulizer solution 2.5 mg  2.5 mg Nebulization Q6H PRN Ritta Slot, NP      . atorvastatin (LIPITOR) tablet 20 mg  20 mg Oral Daily Costin Karlyne Greenspan, MD   20 mg at 04/04/16 1122  . cefTRIAXone (ROCEPHIN) 1 g in dextrose 5 % 50 mL IVPB  1 g Intravenous Q24H Caren Griffins, MD   1 g at 04/03/16 1444  . cloNIDine HCl (KAPVAY) ER tablet 0.1 mg  0.1 mg Oral BID Caren Griffins, MD   0.1 mg at 04/04/16 1122  . feeding supplement (BOOST / RESOURCE BREEZE) liquid 1 Container  1 Container Oral TID BM Allie Bossier, MD   1 Container at 04/04/16 1122  . heparin injection 5,000 Units  5,000 Units Subcutaneous Q8H Oswald Hillock, MD   5,000 Units at 04/04/16 1440  . hydrALAZINE (APRESOLINE) injection 10 mg  10 mg Intravenous Q4H PRN  Gardiner Barefoot, NP      . HYDROcodone-acetaminophen (NORCO/VICODIN) 5-325 MG per tablet 1 tablet  1 tablet Oral Q6H PRN Caren Griffins, MD   1 tablet at 04/03/16 2256  . MEDLINE mouth rinse  15 mL Mouth Rinse BID Allie Bossier, MD   15 mL at 04/04/16 1123  . metoprolol tartrate (LOPRESSOR) tablet 12.5 mg  12.5 mg Oral BID Gardiner Barefoot, NP   12.5 mg at 04/04/16 1122  . morphine 2 MG/ML injection 2 mg  2 mg Intravenous Once Jeryl Columbia, NP      . nitroGLYCERIN (NITROSTAT) SL tablet 0.4 mg  0.4 mg Sublingual Q5 min PRN Gardiner Barefoot, NP   0.4 mg at 03/28/16 2105  . sodium chloride flush (NS) 0.9 % injection 3 mL  3 mL Intravenous Q12H Costin Karlyne Greenspan, MD   3 mL at 03/31/16 2200  . sodium polystyrene (KAYEXALATE) 15 GM/60ML  suspension 45 g  45 g Oral Once Reyne Dumas, MD         Discharge Medications: Please see discharge summary for a list of discharge medications.  Relevant Imaging Results:  Relevant Lab Results:   Additional Information SSN: [id number]  Dulcy Fanny, LCSW

## 2016-04-04 NOTE — Progress Notes (Signed)
Nutrition Follow-up  DOCUMENTATION CODES:   Underweight, Severe malnutrition in context of chronic illness  INTERVENTION:  Continue Boost Breeze po TID, each supplement provides 250 kcal and 9 grams of protein.  Encourage adequate PO intake.   NUTRITION DIAGNOSIS:   Malnutrition related to chronic illness as evidenced by severe depletion of muscle mass, severe depletion of body fat; ongoing  GOAL:   Patient will meet greater than or equal to 90% of their needs; met  MONITOR:   PO intake, Supplement acceptance, Labs, Weight trends, Skin, I & O's  REASON FOR ASSESSMENT:   Other (Comment)    ASSESSMENT:   Joseph Carrillo is a 82 y.o. male with medical history significant of suprapubic catheter due to prostate cancer, colostomy, recent hospitalization for UTI, who presents [place] with 3-4 days of abdominal pain and decreased urine output.  9/11: Successful [place] and fluoroscopic guided placement of a bilateralPCNswith endscoiled and locked within the renal pelves.  Meal completion has been 50-100% with 100% at breakfast this AM. Pt reports appetite is fine, however dislikes most of his food at meals as he reports it has lack of seasoning. Pt currently has Boost Breeze ordered and reports he would like to continue with them.   Labs and medications reviewed. Potassium elevated at 5.5.  Diet Order:  Diet renal with fluid restriction Fluid restriction: 1200 mL Fluid; Room service appropriate? Yes; Fluid consistency: Thin  Skin:  Reviewed, no issues  Last BM:  Colostomy 200 ml today  Height:   Ht Readings from Last 1 Encounters:  03/29/16 '5\' 8"'  (1.727 m)    Weight:   Wt Readings from Last 1 Encounters:  04/02/16 123 lb 14.4 oz (56.2 kg)    Ideal Body Weight:  70 kg  BMI:  Body mass index is 18.84 kg/m.  Estimated Nutritional Needs:   Kcal:  1550-1750  Protein:  75-90 grams  Fluid:  1.2 L/day  EDUCATION NEEDS:   Education needs addressed  Corrin Parker, MS, RD,  LDN Pager # [phone] After hours/ weekend pager # [phone]

## 2016-04-04 NOTE — Progress Notes (Signed)
Patient ID: Joseph Carrillo, male   DOB: [birth year], 82 y.o.   MRN: [id number]    Referring Physician(s): Dr. Franchot Gallo  Supervising Physician: Aletta Edouard  Patient Status: inpt  Chief Complaint: Bilateral hydronephrosis  Subjective: Patient doing well.  No complaints.  Both drains are working well  Allergies: Review of patient's allergies indicates no known allergies.  Medications: Prior to Admission medications   Medication Sig Start Date End Date Taking? Authorizing Provider  atorvastatin (LIPITOR) 20 MG tablet Take 20 mg by mouth daily.   Yes Historical Provider, MD  cloNIDine HCl (KAPVAY) 0.1 MG TB12 ER tablet Take 0.1 mg by mouth 2 (two) times daily.   Yes Historical Provider, MD  HYDROcodone-acetaminophen (NORCO/VICODIN) 5-325 MG tablet Take 1 tablet by mouth every 6 (six) hours as needed. 03/22/16  Yes Gay Filler Copland, MD  sodium bicarbonate 650 MG tablet Take 650 mg by mouth 4 (four) times daily.   Yes Historical Provider, MD  nitroGLYCERIN (NITROSTAT) 0.4 MG SL tablet Place 0.4 mg under the tongue every 5 (five) minutes as needed for chest pain.    Historical Provider, MD    Vital Signs: BP (!) 154/48 (BP Location: Right Arm)   Pulse 64   Temp 98.4 F (36.9 C) (Oral)   Resp 18   Ht 5\' 8"  (1.727 m)   Wt 123 lb 14.4 oz (56.2 kg)   SpO2 100%   BMI 18.84 kg/m   Physical Exam: Abd: both posterior drain sites are c/d/i.  Both PCNs in place with mostly clear urine output, still minimal blood tinge, but improving  Imaging: Ir Nephrostomy Placement Left  Result Date: 04/02/2016 INDICATION: History of prostate cancer with chronic suprapubic Foley catheter, now with acute on chronic elevation of serum creatinine with cross-sectional imaging demonstrated mild-to-moderate bilateral pelvicaliectasis. Request made for placement of bilateral percutaneous nephrostomy catheters for renal preservation purposes. EXAM: 1. ULTRASOUND GUIDANCE FOR PUNCTURE OF THE BILATERAL  RENAL COLLECTING SYSTEMS 2. BILATERAL PERCUTANEOUS NEPHROSTOMY TUBE PLACEMENT. COMPARISON:  CT of the abdomen pelvis - 03/28/2016; renal ultrasound - 03/29/2016 MEDICATIONS: Ciprofloxacin 400 mg IV; The antibiotic was administered in an appropriate time frame prior to skin puncture. ANESTHESIA/SEDATION: Fentanyl 75 mcg IV; Versed 2 mg IV Total Moderate Sedation Time 25 minutes. CONTRAST:  A total of 20 mL Isovue 300 was administered into the renal collecting systems. FLUOROSCOPY TIME:  5 minutes 36 seconds (20 mGy) COMPLICATIONS: None immediate. PROCEDURE: The procedure, risks, benefits, and alternatives were explained to the patient. Questions regarding the procedure were encouraged and answered. The patient understands and consents to the procedure. A timeout was performed prior to the initiation of the procedure. The bilateral flanks were prepped with Betadine in a sterile fashion, and a sterile drape was applied covering the operative field. A sterile gown and sterile gloves were used for the procedure. Local anesthesia was provided with 1% Lidocaine with epinephrine. Ultrasound was used to localize the left kidney. Under direct ultrasound guidance, a 21 gauge needle was advanced into the renal collecting system. An ultrasound image documentation was performed. Access within the collecting system was confirmed with the efflux of urine followed by contrast injection. Over a Nitrex wire, an Accustick set was advanced into the renal collecting system. Contrast injection was injected into the collecting system. Over a guide wire, a 10-French percutaneous nephrostomy catheter was advanced into the collecting system where the coil was formed and locked. Contrast was injected and several sport radiographs were obtained in various obliquities confirming access. The catheter  was secured at the skin with a Prolene retention suture and a gravity bag was placed. Attention was now paid towards placement of a right-sided  percutaneous nephrostomy catheter. The identical procedure was performed ultimately allowing placement of a 10 French percutaneous nephrostomy catheter via a posterior inferior calyx with end coiled and locked within the right renal pelvis. The catheter was secured to skin with a Prolene retention suture and connected to a gravity bag. Dressings were placed. The patient tolerated procedure well without immediate postprocedural complication. FINDINGS: Ultrasound scanning demonstrates very mild dilatation of the bilateral renal collecting systems, unchanged to minimally improved compared to the 03/29/2016 examination. Under direct ultrasound guidance, posterior inferior calices were targeted bilaterally allowing placement of bilateral 10-French percutaneous nephrostomy catheter under intermittent fluoroscopic guidance. Contrast injection confirmed appropriate positioning. IMPRESSION: Successful ultrasound and fluoroscopic guided placement of bilateral 10 French PCNs. Electronically Signed   By: Sandi Mariscal M.D.   On: 04/02/2016 16:38   Ir Nephrostomy Placement Right  Result Date: 04/02/2016 INDICATION: History of prostate cancer with chronic suprapubic Foley catheter, now with acute on chronic elevation of serum creatinine with cross-sectional imaging demonstrated mild-to-moderate bilateral pelvicaliectasis. Request made for placement of bilateral percutaneous nephrostomy catheters for renal preservation purposes. EXAM: 1. ULTRASOUND GUIDANCE FOR PUNCTURE OF THE BILATERAL RENAL COLLECTING SYSTEMS 2. BILATERAL PERCUTANEOUS NEPHROSTOMY TUBE PLACEMENT. COMPARISON:  CT of the abdomen pelvis - 03/28/2016; renal ultrasound - 03/29/2016 MEDICATIONS: Ciprofloxacin 400 mg IV; The antibiotic was administered in an appropriate time frame prior to skin puncture. ANESTHESIA/SEDATION: Fentanyl 75 mcg IV; Versed 2 mg IV Total Moderate Sedation Time 25 minutes. CONTRAST:  A total of 20 mL Isovue 300 was administered into the  renal collecting systems. FLUOROSCOPY TIME:  5 minutes 36 seconds (20 mGy) COMPLICATIONS: None immediate. PROCEDURE: The procedure, risks, benefits, and alternatives were explained to the patient. Questions regarding the procedure were encouraged and answered. The patient understands and consents to the procedure. A timeout was performed prior to the initiation of the procedure. The bilateral flanks were prepped with Betadine in a sterile fashion, and a sterile drape was applied covering the operative field. A sterile gown and sterile gloves were used for the procedure. Local anesthesia was provided with 1% Lidocaine with epinephrine. Ultrasound was used to localize the left kidney. Under direct ultrasound guidance, a 21 gauge needle was advanced into the renal collecting system. An ultrasound image documentation was performed. Access within the collecting system was confirmed with the efflux of urine followed by contrast injection. Over a Nitrex wire, an Accustick set was advanced into the renal collecting system. Contrast injection was injected into the collecting system. Over a guide wire, a 10-French percutaneous nephrostomy catheter was advanced into the collecting system where the coil was formed and locked. Contrast was injected and several sport radiographs were obtained in various obliquities confirming access. The catheter was secured at the skin with a Prolene retention suture and a gravity bag was placed. Attention was now paid towards placement of a right-sided percutaneous nephrostomy catheter. The identical procedure was performed ultimately allowing placement of a 10 French percutaneous nephrostomy catheter via a posterior inferior calyx with end coiled and locked within the right renal pelvis. The catheter was secured to skin with a Prolene retention suture and connected to a gravity bag. Dressings were placed. The patient tolerated procedure well without immediate postprocedural complication.  FINDINGS: Ultrasound scanning demonstrates very mild dilatation of the bilateral renal collecting systems, unchanged to minimally improved compared to the 03/29/2016 examination.  Under direct ultrasound guidance, posterior inferior calices were targeted bilaterally allowing placement of bilateral 10-French percutaneous nephrostomy catheter under intermittent fluoroscopic guidance. Contrast injection confirmed appropriate positioning. IMPRESSION: Successful ultrasound and fluoroscopic guided placement of bilateral 10 French PCNs. Electronically Signed   By: Sandi Mariscal M.D.   On: 04/02/2016 16:38    Labs:  CBC:  Recent Labs  03/29/16 1113 03/30/16 0301 03/31/16 0307 04/01/16 0523 04/02/16 1232  WBC 13.4* 10.7* 12.2* 9.9  --   HGB 10.2* 8.9* 8.2* 9.0*  --   HCT 33.7* 28.8* 27.0* 29.2*  --   PLT 193 154 160 176 195    COAGS:  Recent Labs  03/28/16 2156 04/02/16 0619 04/02/16 1232  INR 1.09 >10.00* 1.15  APTT 42*  --  32    BMP:  Recent Labs  04/01/16 0519 04/02/16 0619 04/03/16 0410 04/04/16 0759  NA 141 138 139 141  K 5.6* 5.9* 5.6* 5.5*  CL 112* 115* 114* 120*  CO2 19* 16* 17* 14*  GLUCOSE 89 80 93 76  BUN 65* 67* 61* 48*  CALCIUM 8.9 8.6* 8.8* 8.5*  CREATININE 3.49* 3.55* 3.20* 2.60*  GFRNONAA 15* 14* 16* 21*  GFRAA 17* 17* 19* 24*    LIVER FUNCTION TESTS:  Recent Labs  03/28/16 1007 03/30/16 0301  04/01/16 0519 04/02/16 0619 04/03/16 0410 04/04/16 0759  BILITOT 0.9 0.3  --   --   --   --   --   AST 38 36  --   --   --   --   --   ALT 62 53  --   --   --   --   --   ALKPHOS 61 48  --   --   --   --   --   PROT 7.2 5.8*  --   --   --   --   --   ALBUMIN 3.1* 2.3*  < > 2.3* 2.3* 2.3* 2.4*  < > = values in this interval not displayed.  Assessment and Plan: 1. B hydronephrosis, s/p (B) PCNs on 9-11 -patient doing well.  Drains are draining well.  750cc (L) 600cc (R) -will follow -further plans per urology   Electronically Signed: Matika Bartell  E 04/04/2016, 10:47 AM   I spent a total of 15 Minutes at the the patient's bedside AND on the patient's hospital floor or unit, greater than 50% of which was counseling/coordinating care for (B) hydronephrosis

## 2016-04-05 DIAGNOSIS — N184 Chronic kidney disease, stage 4 (severe): Secondary | ICD-10-CM

## 2016-04-05 DIAGNOSIS — N1330 Unspecified hydronephrosis: Principal | ICD-10-CM

## 2016-04-05 LAB — COMPREHENSIVE METABOLIC PANEL
ALBUMIN: 2.4 g/dL — AB (ref 3.5–5.0)
ALK PHOS: 49 U/L (ref 38–126)
ALT: 75 U/L — ABNORMAL HIGH (ref 17–63)
ANION GAP: 7 (ref 5–15)
AST: 66 U/L — ABNORMAL HIGH (ref 15–41)
BUN: 36 mg/dL — ABNORMAL HIGH (ref 6–20)
CHLORIDE: 122 mmol/L — AB (ref 101–111)
CO2: 15 mmol/L — AB (ref 22–32)
Calcium: 8.6 mg/dL — ABNORMAL LOW (ref 8.9–10.3)
Creatinine, Ser: 2.2 mg/dL — ABNORMAL HIGH (ref 0.61–1.24)
GFR calc Af Amer: 29 mL/min — ABNORMAL LOW (ref >60)
GFR calc non Af Amer: 25 mL/min — ABNORMAL LOW (ref >60)
GLUCOSE: 173 mg/dL — AB (ref 65–99)
POTASSIUM: 4.3 mmol/L (ref 3.5–5.1)
SODIUM: 144 mmol/L (ref 135–145)
Total Bilirubin: 0.3 mg/dL (ref 0.3–1.2)
Total Protein: 5.8 g/dL — ABNORMAL LOW (ref 6.5–8.1)

## 2016-04-05 LAB — CBC
HCT: 27.1 % — ABNORMAL LOW (ref 39.0–52.0)
HEMOGLOBIN: 8.3 g/dL — AB (ref 13.0–17.0)
MCH: 28.5 pg (ref 26.0–34.0)
MCHC: 30.6 g/dL (ref 30.0–36.0)
MCV: 93.1 fL (ref 78.0–100.0)
PLATELETS: 175 10*3/uL (ref 150–400)
RBC: 2.91 MIL/uL — ABNORMAL LOW (ref 4.22–5.81)
RDW: 16.6 % — ABNORMAL HIGH (ref 11.5–15.5)
WBC: 8.6 10*3/uL (ref 4.0–10.5)

## 2016-04-05 LAB — PHOSPHORUS: Phosphorus: 3.2 mg/dL (ref 2.5–4.6)

## 2016-04-05 MED ORDER — METOPROLOL TARTRATE 25 MG PO TABS: 12.5000 mg | ORAL_TABLET | Freq: Two times a day (BID) | ORAL | 0 refills | Status: AC

## 2016-04-05 MED ORDER — HYDROCODONE-ACETAMINOPHEN 5-325 MG PO TABS: 1.0000 | ORAL_TABLET | Freq: Four times a day (QID) | ORAL | 0 refills | Status: DC | PRN

## 2016-04-05 MED ORDER — DEXTROSE 5 % IV SOLN: 1.0000 g | INTRAVENOUS | 0 refills | Status: AC

## 2016-04-05 MED ORDER — ALBUTEROL SULFATE (2.5 MG/3ML) 0.083% IN NEBU: 2.5000 mg | INHALATION_SOLUTION | Freq: Four times a day (QID) | RESPIRATORY_TRACT | 12 refills | Status: AC | PRN

## 2016-04-05 NOTE — Clinical Social Work Placement (Signed)
   CLINICAL SOCIAL WORK PLACEMENT  NOTE  Date:  04/05/2016  Patient Details  Name: Joseph Carrillo MRN: [id number] Date of Birth: [birth year]  Clinical Social Work is seeking post-discharge placement for this patient at the [place] level of care (*CSW will initial, date and re-position this form in  chart as items are completed):  Yes   Patient/family provided with [place] Work Department's list of facilities offering this level of care within the geographic area requested by the patient (or if unable, by the patient's family).  Yes   Patient/family informed of their freedom to choose among providers that offer the needed level of care, that participate in Medicare, Medicaid or managed care program needed by the patient, have an available bed and are willing to accept the patient.  Yes   Patient/family informed of [facility]'s ownership interest in [facility] and [facility], as well as of the fact that they are under no obligation to receive care at these facilities.  PASRR submitted to EDS on 04/04/16     PASRR number received on 04/04/16     Existing PASRR number confirmed on       FL2 transmitted to all facilities in geographic area requested by pt/family on 04/04/16     FL2 transmitted to all facilities within larger geographic area on       Patient informed that his/her managed care company has contracts with or will negotiate with certain facilities, including the following:        Yes   Patient/family informed of bed offers received.  Patient chooses bed at [facility]     Physician recommends and patient chooses bed at      Patient to be transferred to [facility] on 04/05/16.  Patient to be transferred to facility by ambulance     Patient family notified on 04/05/16 of transfer.  Name of family member notified:  Montez Morita     PHYSICIAN Please prepare priority discharge summary, including medications, Please prepare  prescriptions, Please sign FL2     Additional Comment:  Per MD patient is ready to discharge to [facility]. RN, patient, patient's family, and facility notified of discharge. RN given phone number for report and transport packet is on patient's chart. Ambulance transport requested. CSW signing off.   _______________________________________________ Samule Dry, LCSW 04/05/2016, 3:43 PM

## 2016-04-05 NOTE — Discharge Summary (Addendum)
Physician Discharge Summary  Joseph Carrillo MRN: [id number] DOB/AGE: [birth year] 82 y.o.  PCP: [name], MD   Admit date: 03/28/2016 Discharge date: 04/05/2016  Discharge Diagnoses:    Active Problems:   Colostomy in [facility])   Prostate cancer ([place])   Physical debility   Hyperkalemia   CKD (chronic kidney disease)   Suprapubic catheter ([facility])   UTI (urinary tract infection)   Leukocytosis   Anemia   Renal failure (ARF), acute on chronic (HCC)   Protein-calorie malnutrition, severe   Hydronephrosis, bilateral    Follow-up recommendations Follow-up with PCP in 3-5 days , including all  additional recommended appointments as below Follow-up CBC, CMP in 3-5 days SNF to continue monitoring renal function and potassium closely Follow-up with urology Bjorn Loser, MD, for his obstructive uropathy and hx of prostrate cancer      Current Discharge Medication List    START taking these medications   Details  albuterol (PROVENTIL) (2.5 MG/3ML) 0.083% nebulizer solution Take 3 mLs (2.5 mg total) by nebulization every 6 (six) hours as needed for wheezing or shortness of breath. Qty: 75 mL, Refills: 12    cefTRIAXone 1 g in dextrose 5 % 50 mL Inject 1 g into the vein daily. Qty: 5 ampule, Refills: 0    metoprolol tartrate (LOPRESSOR) 25 MG tablet Take 0.5 tablets (12.5 mg total) by mouth 2 (two) times daily. Qty: 60 tablet, Refills: 0      CONTINUE these medications which have CHANGED   Details  HYDROcodone-acetaminophen (NORCO/VICODIN) 5-325 MG tablet Take 1 tablet by mouth every 6 (six) hours as needed. Qty: 30 tablet, Refills: 0   Associated Diagnoses: Abdominal pain, chronic, epigastric      CONTINUE these medications which have NOT CHANGED   Details  atorvastatin (LIPITOR) 20 MG tablet Take 20 mg by mouth daily.    cloNIDine HCl (KAPVAY) 0.1 MG TB12 ER tablet Take 0.1 mg by mouth 2 (two) times daily.    sodium bicarbonate 650 MG tablet Take 650 mg by mouth  4 (four) times daily.    nitroGLYCERIN (NITROSTAT) 0.4 MG SL tablet Place 0.4 mg under the tongue every 5 (five) minutes as needed for chest pain.         Discharge Condition:Overall prognosis poor    Discharge Instructions Get Medicines reviewed and adjusted: Please take all your medications with you for your next visit with your Primary MD  Please request your Primary MD to go over all hospital tests and procedure/radiological results at the follow up, please ask your Primary MD to get all Hospital records sent to his/her office.  If you experience worsening of your admission symptoms, develop shortness of breath, life threatening emergency, suicidal or homicidal thoughts you must seek medical attention immediately by calling 911 or calling your MD immediately if symptoms less severe.  You must read complete instructions/literature along with all the possible adverse reactions/side effects for all the Medicines you take and that have been prescribed to you. Take any new Medicines after you have completely understood and accpet all the possible adverse reactions/side effects.   Do not drive when taking Pain medications.   Do not take more than prescribed Pain, Sleep and Anxiety Medications  Special Instructions: If you have smoked or chewed Tobacco in the last 2 yrs please stop smoking, stop any regular Alcohol and or any Recreational drug use.  Wear Seat belts while driving.  Please note  You were cared for by a hospitalist during your hospital stay. Once  you are discharged, your primary care physician will handle any further medical issues. Please note that NO REFILLS for any discharge medications will be authorized once you are discharged, as it is imperative that you return to your primary care physician (or establish a relationship with a primary care physician if you do not have one) for your aftercare needs so that they can reassess your need for medications and monitor your  lab values.  Discharge Instructions    Diet - low sodium heart healthy    Complete by:  As directed    Increase activity slowly    Complete by:  As directed        No Known Allergies    Disposition: SNF   Consults:  Neurology   Significant Diagnostic Studies:  Ct Abdomen Pelvis Wo Contrast  Result Date: 03/28/2016 CLINICAL DATA:  "Patient presents with his daughter who assists with the history of present illness.Patient presents with concern of discomfort about a suprapubic catheter site.He notes that over the past day he has had pain persistently in this area, and today, noticed decreased urine production.No change in the consistency of urine, which began to be produced soon after he awoke, was upright.No new fever, chills.Patient has a notable history of recent admission to a hospital in [place] for urinary tract infection, discharged to a nursing facility.One week ago he was discharged from that facility, now moved to this area, to reside with his daughter." EXAM: CT ABDOMEN AND PELVIS WITHOUT CONTRAST TECHNIQUE: Multidetector CT imaging of the abdomen and pelvis was performed following the standard protocol without IV contrast. COMPARISON:  None. FINDINGS: Lower chest: In the left lower lobe there is a 10 x 7 mm nodule (8.5 mm mean diameter) on image 4 of series 6. Fairly dense 6 mm nodule lies in the posterior right middle lobe, image 4, series 6. Minor subsegmental atelectasis at the posterior lung bases. Heart normal in size. Hepatobiliary: Normal liver. Dependent density in the gallstone consistent with small layering stones. No wall thickening or evidence of acute cholecystitis. No bile duct dilation. Pancreas: Unremarkable. Spleen: Small calcifications consistent with healed granuloma. Otherwise unremarkable. Adrenals/Urinary Tract: No adrenal masses. Moderate right and mild to moderate left hydronephrosis. No renal masses. No intrarenal stones. Moderate hydroureter, right  somewhat greater than left. No ureteral stone. Bladder is mostly decompressed with a suprapubic catheter. Bladder is elevated by an enlarged prostate. Stomach/Bowel: Stomach and small bowel are unremarkable. There is an ileostomy in the right mid abdomen. Visualize colon is decompressed. No bowel obstruction. No bowel wall thickening or inflammation. Vascular/Lymphatic: Diffuse aortic ectasia. Maximum AP diameter of the infrarenal aorta is 2.5 cm. There is atherosclerotic calcification along the aorta and its branch vessels. No enlarged lymph nodes. Reproductive: Prostate gland is enlarged measuring 5.0 x 4.9 x 5.5 cm. Other: No ascites.  No abdominal wall mass or collection. Musculoskeletal: Degenerative changes noted of the lumbar spine. Arthropathic changes noted of the hips mostly on the right. Bones are demineralized. No osteoblastic or discrete osteolytic lesions. IMPRESSION: 1. Bilateral hydroureteronephrosis. No ureteral stone. Bladder is mostly decompressed with a suprapubic Foley catheter, well positioned. 2. Enlarged prostate gland. 3. Gallstones without evidence of acute cholecystitis. 4. Aortic atherosclerosis. Aortic ectasia. Ectatic abdominal aorta at risk for aneurysm development. Recommend followup by ultrasound in 5 years. This recommendation follows ACR consensus guidelines: White Paper of the ACR Incidental Findings Committee II on Vascular Findings. J Am Coll Radiol 2013; 10:789-794. 5. Lung base nodules, largest with a  mean diameter of 8.5 mm. Non-contrast chest CT at 3-6 months is recommended. If the nodules are stable at time of repeat CT, then future CT at 18-24 months (from today's scan) is considered optional for low-risk patients, but is recommended for high-risk patients. This recommendation follows the consensus statement: Guidelines for Management of Incidental Pulmonary Nodules Detected on CT Images:From the Fleischner Society 2017; published online before print  (10.1148/radiol.[id number]). Electronically Signed   By: Lajean Manes M.D.   On: 03/28/2016 15:31   US Renal  Result Date: 03/29/2016 CLINICAL DATA:  Acute on chronic renal failure EXAM: RENAL / URINARY TRACT ULTRASOUND COMPLETE COMPARISON:  03/28/2016 FINDINGS: Right Kidney: Length: 9.9 cm.  Mild to moderate hydronephrosis is noted. Left Kidney: Length: 9.5 cm. Mild to moderate hydronephrosis is noted slightly less than that on the right. Bladder: Decompressed by suprapubic catheter IMPRESSION: Mild to moderate hydronephrosis bilaterally right slightly greater than left. This is stable from the recent CT. Electronically Signed   By: Inez Catalina M.D.   On: 03/29/2016 14:24   Ir Nephrostomy Placement Left  Result Date: 04/02/2016 INDICATION: History of prostate cancer with chronic suprapubic Foley catheter, now with acute on chronic elevation of serum creatinine with cross-sectional imaging demonstrated mild-to-moderate bilateral pelvicaliectasis. Request made for placement of bilateral percutaneous nephrostomy catheters for renal preservation purposes. EXAM: 1. ULTRASOUND GUIDANCE FOR PUNCTURE OF THE BILATERAL RENAL COLLECTING SYSTEMS 2. BILATERAL PERCUTANEOUS NEPHROSTOMY TUBE PLACEMENT. COMPARISON:  CT of the abdomen pelvis - 03/28/2016; renal ultrasound - 03/29/2016 MEDICATIONS: Ciprofloxacin 400 mg IV; The antibiotic was administered in an appropriate time frame prior to skin puncture. ANESTHESIA/SEDATION: Fentanyl 75 mcg IV; Versed 2 mg IV Total Moderate Sedation Time 25 minutes. CONTRAST:  A total of 20 mL Isovue 300 was administered into the renal collecting systems. FLUOROSCOPY TIME:  5 minutes 36 seconds (20 mGy) COMPLICATIONS: None immediate. PROCEDURE: The procedure, risks, benefits, and alternatives were explained to the patient. Questions regarding the procedure were encouraged and answered. The patient understands and consents to the procedure. A timeout was performed prior to the initiation of  the procedure. The bilateral flanks were prepped with Betadine in a sterile fashion, and a sterile drape was applied covering the operative field. A sterile gown and sterile gloves were used for the procedure. Local anesthesia was provided with 1% Lidocaine with epinephrine. Ultrasound was used to localize the left kidney. Under direct ultrasound guidance, a 21 gauge needle was advanced into the renal collecting system. An ultrasound image documentation was performed. Access within the collecting system was confirmed with the efflux of urine followed by contrast injection. Over a Nitrex wire, an Accustick set was advanced into the renal collecting system. Contrast injection was injected into the collecting system. Over a guide wire, a 10-French percutaneous nephrostomy catheter was advanced into the collecting system where the coil was formed and locked. Contrast was injected and several sport radiographs were obtained in various obliquities confirming access. The catheter was secured at the skin with a Prolene retention suture and a gravity bag was placed. Attention was now paid towards placement of a right-sided percutaneous nephrostomy catheter. The identical procedure was performed ultimately allowing placement of a 10 French percutaneous nephrostomy catheter via a posterior inferior calyx with end coiled and locked within the right renal pelvis. The catheter was secured to skin with a Prolene retention suture and connected to a gravity bag. Dressings were placed. The patient tolerated procedure well without immediate postprocedural complication. FINDINGS: Ultrasound scanning demonstrates very mild dilatation  of the bilateral renal collecting systems, unchanged to minimally improved compared to the 03/29/2016 examination. Under direct ultrasound guidance, posterior inferior calices were targeted bilaterally allowing placement of bilateral 10-French percutaneous nephrostomy catheter under intermittent  fluoroscopic guidance. Contrast injection confirmed appropriate positioning. IMPRESSION: Successful ultrasound and fluoroscopic guided placement of bilateral 10 French PCNs. Electronically Signed   By: Sandi Mariscal M.D.   On: 04/02/2016 16:38   Ir Nephrostomy Placement Right  Result Date: 04/02/2016 INDICATION: History of prostate cancer with chronic suprapubic Foley catheter, now with acute on chronic elevation of serum creatinine with cross-sectional imaging demonstrated mild-to-moderate bilateral pelvicaliectasis. Request made for placement of bilateral percutaneous nephrostomy catheters for renal preservation purposes. EXAM: 1. ULTRASOUND GUIDANCE FOR PUNCTURE OF THE BILATERAL RENAL COLLECTING SYSTEMS 2. BILATERAL PERCUTANEOUS NEPHROSTOMY TUBE PLACEMENT. COMPARISON:  CT of the abdomen pelvis - 03/28/2016; renal ultrasound - 03/29/2016 MEDICATIONS: Ciprofloxacin 400 mg IV; The antibiotic was administered in an appropriate time frame prior to skin puncture. ANESTHESIA/SEDATION: Fentanyl 75 mcg IV; Versed 2 mg IV Total Moderate Sedation Time 25 minutes. CONTRAST:  A total of 20 mL Isovue 300 was administered into the renal collecting systems. FLUOROSCOPY TIME:  5 minutes 36 seconds (20 mGy) COMPLICATIONS: None immediate. PROCEDURE: The procedure, risks, benefits, and alternatives were explained to the patient. Questions regarding the procedure were encouraged and answered. The patient understands and consents to the procedure. A timeout was performed prior to the initiation of the procedure. The bilateral flanks were prepped with Betadine in a sterile fashion, and a sterile drape was applied covering the operative field. A sterile gown and sterile gloves were used for the procedure. Local anesthesia was provided with 1% Lidocaine with epinephrine. Ultrasound was used to localize the left kidney. Under direct ultrasound guidance, a 21 gauge needle was advanced into the renal collecting system. An ultrasound image  documentation was performed. Access within the collecting system was confirmed with the efflux of urine followed by contrast injection. Over a Nitrex wire, an Accustick set was advanced into the renal collecting system. Contrast injection was injected into the collecting system. Over a guide wire, a 10-French percutaneous nephrostomy catheter was advanced into the collecting system where the coil was formed and locked. Contrast was injected and several sport radiographs were obtained in various obliquities confirming access. The catheter was secured at the skin with a Prolene retention suture and a gravity bag was placed. Attention was now paid towards placement of a right-sided percutaneous nephrostomy catheter. The identical procedure was performed ultimately allowing placement of a 10 French percutaneous nephrostomy catheter via a posterior inferior calyx with end coiled and locked within the right renal pelvis. The catheter was secured to skin with a Prolene retention suture and connected to a gravity bag. Dressings were placed. The patient tolerated procedure well without immediate postprocedural complication. FINDINGS: Ultrasound scanning demonstrates very mild dilatation of the bilateral renal collecting systems, unchanged to minimally improved compared to the 03/29/2016 examination. Under direct ultrasound guidance, posterior inferior calices were targeted bilaterally allowing placement of bilateral 10-French percutaneous nephrostomy catheter under intermittent fluoroscopic guidance. Contrast injection confirmed appropriate positioning. IMPRESSION: Successful ultrasound and fluoroscopic guided placement of bilateral 10 French PCNs. Electronically Signed   By: Sandi Mariscal M.D.   On: 04/02/2016 16:38        Filed Weights   03/30/16 2102 04/01/16 2153 04/02/16 2120  Weight: 54.1 kg (119 lb 4.3 oz) 55 kg (121 lb 4.1 oz) 56.2 kg (123 lb 14.4 oz)     Microbiology:  Recent Results (from the past 240  hour(s))  Culture, Urine     Status: Abnormal   Collection Time: 03/28/16  9:49 AM  Result Value Ref Range Status   Specimen Description URINE, RANDOM  Final   Special Requests NONE  Final   Culture MULTIPLE SPECIES PRESENT, SUGGEST RECOLLECTION (A)  Final   Report Status 03/29/2016 FINAL  Final  MRSA PCR Screening     Status: None   Collection Time: 03/28/16  4:00 PM  Result Value Ref Range Status   MRSA by PCR NEGATIVE NEGATIVE Final    Comment:        The GeneXpert MRSA Assay (FDA approved for NASAL specimens only), is one component of a comprehensive MRSA colonization surveillance program. It is not intended to diagnose MRSA infection nor to guide or monitor treatment for MRSA infections.   Culture, Urine     Status: Abnormal   Collection Time: 03/29/16  7:53 PM  Result Value Ref Range Status   Specimen Description URINE, SUPRAPUBIC  Final   Special Requests NONE  Final   Culture MULTIPLE SPECIES PRESENT, SUGGEST RECOLLECTION (A)  Final   Report Status 03/31/2016 FINAL  Final       Blood Culture    Component Value Date/Time   SDES URINE, SUPRAPUBIC 03/29/2016 1953   SPECREQUEST NONE 03/29/2016 1953   CULT MULTIPLE SPECIES PRESENT, SUGGEST RECOLLECTION (A) 03/29/2016 1953   REPTSTATUS 03/31/2016 FINAL 03/29/2016 1953      Labs: Results for orders placed or performed during the hospital encounter of 03/28/16 (from the past 48 hour(s))  Renal function panel     Status: Abnormal   Collection Time: 04/04/16  7:59 AM  Result Value Ref Range   Sodium 141 135 - 145 mmol/L   Potassium 5.5 (H) 3.5 - 5.1 mmol/L   Chloride 120 (H) 101 - 111 mmol/L   CO2 14 (L) 22 - 32 mmol/L   Glucose, Bld 76 65 - 99 mg/dL   BUN 48 (H) 6 - 20 mg/dL   Creatinine, Ser 2.60 (H) 0.61 - 1.24 mg/dL   Calcium 8.5 (L) 8.9 - 10.3 mg/dL   Phosphorus 3.8 2.5 - 4.6 mg/dL   Albumin 2.4 (L) 3.5 - 5.0 g/dL   GFR calc non Af Amer 21 (L) >60 mL/min   GFR calc Af Amer 24 (L) >60 mL/min    Comment:  (NOTE) The eGFR has been calculated using the CKD EPI equation. This calculation has not been validated in all clinical situations. eGFR's persistently <60 mL/min signify possible Chronic Kidney Disease.    Anion gap 7 5 - 15     Lipid Panel     Component Value Date/Time   CHOL 87 03/29/2016 0212   TRIG 36 03/29/2016 0212   HDL 53 03/29/2016 0212   CHOLHDL 1.6 03/29/2016 0212   VLDL 7 03/29/2016 0212   LDLCALC 27 03/29/2016 0212     No results found for: HGBA1C   Lab Results  Component Value Date   LDLCALC 27 03/29/2016   CREATININE 2.60 (H) 04/04/2016     HPI :  82 y/o ? 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  He was recently hospitalized in  [place], [place] related to UTI.  He says that he had several hospital visits over the summer related to this, however following the most recent, he was d/c'd to rehab.  He says that he was in either the hosp or rehab for about the past month.  Following d/c from rehab, he came to the [place] area to live with his sister.  He says that he has been feeling weak and has noted reduced UO with dark and smelly urine.  For those reasons, he presented to [facility] on the evening of 9/6.  There, he was found to be hyperkalemic with a K of >7.5 and creat of 3.60.  In the setting of hyperkalemia, there was concern for ST elevation (peaked T's noted), though this was not felt to represent a STEMI.  Pt was not having c/p but did c/o left arm/shoulder/bicep aching.  CT of the abd/pelvis showed bilat hydroureteronephrosis (R>L).  He was tx to [facility] and seen by urology. ED Course: Vital signs within normal limits. CMP significant for  potassium greater than 7.5, chloride 15, bicarbonate 19, BUN 87, creatinine 3.6. CBC remarkable for WBC 15, hemoglobin 9.3, hematocrit 30.1. Urinalysis remarkable for many bacteria, large hemoglobin, large leukocytes  and protein greater than 300. EKG with mildly peaked T waves in anterior leads. Received calcium chloride 1 g, dextrose 50%, insulin 10 units IV, sodium bicarbonate x2, Kayexalate, albuterol and a liter of normal saline bolus. Nephrology was consulted over the phone  HOSPITAL COURSE:    1. Hyperkalemia-in the setting of renal failure, acute on chronic bilateral hydro-ureteronephrosis, coming down slowly, potassium is still elevated slightly around 5.5, continue to repeat Kayexalate, closely monitor potassium as needed. Urology consulted in the setting of bilat hydroureteronephrosis.   2. Elevated PT/INR- Lab error, patient had elevated INR 10.0 yesterday, his repeat INR 1.15.   3. Acute kidney injury on CKD stage III- reviewed the records from [facility] in [place], patient's  creatinine as of 02/29/2016 was 2.0. Patient now has chronic obstructive uropathy, urology recommended nephrostomy tubes. Reviewed ultrasound of kidneys done on 8/17 showed mild bilateral hydronephrosis of uncertain etiology. Renal function is improving after placement of nephrostomy tube. Today creatinine 2.2. Follow BMP in a.m. Plan per Dr Diona Fanti, patient to follow-up with urology in the outpatient setting.Given his hx of prostate cancer ,suspect that hematuria from right nephrostomy tube is probably from metastatic disease.Urology to make further recommendations    4. Bilateral hydronephrosis with worsening renal function- status post bilateral nephrostomy tubes.History of prostate cancer- Apparently patient had an extensive surgery requiring catheter placement as well as a ostomy bag.    5. Lung nodule- patient will require noncontrast CT chest in 3-6 months as  outpatient.   6. Anemia of chronic disease- hemoglobin has improved to 9.0, from  8.2, likely from anemia of chronic kidney disease. Patient had EGD done on 02/24/2016 at [place] showed no significant abnormality. Colonoscopy was not performed due to advanced age. Patient's hemoglobin was 10.5 on 02/29/2016. We'll continue to monitor patient's hemoglobin in the hospital.   7. Abnormal UA- urine cultures x 2 growing only multiple bacterial colonies consistent with contamination. However patient immunocompromised therefore we will continue with Rocephin for another 5 days   8. Chest pain-patient had mild elevation of troponin, cardiology was consulted. No intervention recommended at this time. Cardiology has signed off.    Discharge Exam:   Blood pressure (!) 151/46, pulse (!) 55, temperature 98.5 F (36.9  C), temperature source Oral, resp. rate 14, height '5\' 8"'  (1.727 m), weight 56.2 kg (123 lb 14.4 oz), SpO2 100 %.  General exam: Appears calm and comfortable  Respiratory system: Clear to auscultation. Respiratory effort normal. Cardiovascular system: S1 & S2 heard, RRR. No JVD, murmurs, rubs, gallops or clicks. No pedal edema. Gastrointestinal system: Abdomen is nondistended, soft and nontender. No organomegaly or masses felt. Normal bowel sounds heard. Central nervous system: Alert and oriented. No focal neurological deficits. Extremities: Symmetric 5 x 5 power. Skin: No rashes, lesions or ulcers Psychiatry: Judgement and insight appear normal. Mood & affect appropriate.       SignedReyne Dumas 04/05/2016, 9:02 AM        Time spent >45 mins

## 2016-04-05 NOTE — Care Management Important Message (Signed)
Important Message  Patient Details  Name: Joseph Carrillo MRN: [id number] Date of Birth: 11-22-1926   Medicare Important Message Given:  Yes    Nike Southers Abena 04/05/2016, 11:21 AM

## 2016-04-05 NOTE — Progress Notes (Signed)
Referring Physician(s): Dahlstedt,S  Supervising Physician: Sandi Mariscal  Patient Status:  Inpatient  Chief Complaint:  Bilateral hydronephrosis  Subjective:  Pt doing ok; no new c/o; sl tender at nephrostomy insertion sites  Allergies: Review of patient's allergies indicates no known allergies.  Medications: Prior to Admission medications   Medication Sig Start Date End Date Taking? Authorizing Provider  atorvastatin (LIPITOR) 20 MG tablet Take 20 mg by mouth daily.   Yes Historical Provider, MD  cloNIDine HCl (KAPVAY) 0.1 MG TB12 ER tablet Take 0.1 mg by mouth 2 (two) times daily.   Yes Historical Provider, MD  sodium bicarbonate 650 MG tablet Take 650 mg by mouth 4 (four) times daily.   Yes Historical Provider, MD  albuterol (PROVENTIL) (2.5 MG/3ML) 0.083% nebulizer solution Take 3 mLs (2.5 mg total) by nebulization every 6 (six) hours as needed for wheezing or shortness of breath. 04/05/16   Reyne Dumas, MD  cefTRIAXone 1 g in dextrose 5 % 50 mL Inject 1 g into the vein daily. 04/05/16 04/10/16  Reyne Dumas, MD  HYDROcodone-acetaminophen (NORCO/VICODIN) 5-325 MG tablet Take 1 tablet by mouth every 6 (six) hours as needed. 04/05/16   Reyne Dumas, MD  metoprolol tartrate (LOPRESSOR) 25 MG tablet Take 0.5 tablets (12.5 mg total) by mouth 2 (two) times daily. 04/05/16   Reyne Dumas, MD  nitroGLYCERIN (NITROSTAT) 0.4 MG SL tablet Place 0.4 mg under the tongue every 5 (five) minutes as needed for chest pain.    Historical Provider, MD     Vital Signs: BP (!) 140/58 (BP Location: Right Arm)   Pulse 79   Temp 98 F (36.7 C) (Oral)   Resp 15   Ht 5\' 8"  (1.727 m)   Wt 123 lb 14.4 oz (56.2 kg)   SpO2 100%   BMI 18.84 kg/m   Physical Exam bilat PCN's intact, dressings dry; mildly tender; outputs L-1.3 liters yellow urine, R- 965 cc blood tinged urine;   Imaging: Ir Nephrostomy Placement Left  Result Date: 04/02/2016 INDICATION: History of prostate cancer with chronic  suprapubic Foley catheter, now with acute on chronic elevation of serum creatinine with cross-sectional imaging demonstrated mild-to-moderate bilateral pelvicaliectasis. Request made for placement of bilateral percutaneous nephrostomy catheters for renal preservation purposes. EXAM: 1. ULTRASOUND GUIDANCE FOR PUNCTURE OF THE BILATERAL RENAL COLLECTING SYSTEMS 2. BILATERAL PERCUTANEOUS NEPHROSTOMY TUBE PLACEMENT. COMPARISON:  CT of the abdomen pelvis - 03/28/2016; renal ultrasound - 03/29/2016 MEDICATIONS: Ciprofloxacin 400 mg IV; The antibiotic was administered in an appropriate time frame prior to skin puncture. ANESTHESIA/SEDATION: Fentanyl 75 mcg IV; Versed 2 mg IV Total Moderate Sedation Time 25 minutes. CONTRAST:  A total of 20 mL Isovue 300 was administered into the renal collecting systems. FLUOROSCOPY TIME:  5 minutes 36 seconds (20 mGy) COMPLICATIONS: None immediate. PROCEDURE: The procedure, risks, benefits, and alternatives were explained to the patient. Questions regarding the procedure were encouraged and answered. The patient understands and consents to the procedure. A timeout was performed prior to the initiation of the procedure. The bilateral flanks were prepped with Betadine in a sterile fashion, and a sterile drape was applied covering the operative field. A sterile gown and sterile gloves were used for the procedure. Local anesthesia was provided with 1% Lidocaine with epinephrine. Ultrasound was used to localize the left kidney. Under direct ultrasound guidance, a 21 gauge needle was advanced into the renal collecting system. An ultrasound image documentation was performed. Access within the collecting system was confirmed with the efflux of urine followed  by contrast injection. Over a Nitrex wire, an Accustick set was advanced into the renal collecting system. Contrast injection was injected into the collecting system. Over a guide wire, a 10-French percutaneous nephrostomy catheter was  advanced into the collecting system where the coil was formed and locked. Contrast was injected and several sport radiographs were obtained in various obliquities confirming access. The catheter was secured at the skin with a Prolene retention suture and a gravity bag was placed. Attention was now paid towards placement of a right-sided percutaneous nephrostomy catheter. The identical procedure was performed ultimately allowing placement of a 10 French percutaneous nephrostomy catheter via a posterior inferior calyx with end coiled and locked within the right renal pelvis. The catheter was secured to skin with a Prolene retention suture and connected to a gravity bag. Dressings were placed. The patient tolerated procedure well without immediate postprocedural complication. FINDINGS: Ultrasound scanning demonstrates very mild dilatation of the bilateral renal collecting systems, unchanged to minimally improved compared to the 03/29/2016 examination. Under direct ultrasound guidance, posterior inferior calices were targeted bilaterally allowing placement of bilateral 10-French percutaneous nephrostomy catheter under intermittent fluoroscopic guidance. Contrast injection confirmed appropriate positioning. IMPRESSION: Successful ultrasound and fluoroscopic guided placement of bilateral 10 French PCNs. Electronically Signed   By: Sandi Mariscal M.D.   On: 04/02/2016 16:38   Ir Nephrostomy Placement Right  Result Date: 04/02/2016 INDICATION: History of prostate cancer with chronic suprapubic Foley catheter, now with acute on chronic elevation of serum creatinine with cross-sectional imaging demonstrated mild-to-moderate bilateral pelvicaliectasis. Request made for placement of bilateral percutaneous nephrostomy catheters for renal preservation purposes. EXAM: 1. ULTRASOUND GUIDANCE FOR PUNCTURE OF THE BILATERAL RENAL COLLECTING SYSTEMS 2. BILATERAL PERCUTANEOUS NEPHROSTOMY TUBE PLACEMENT. COMPARISON:  CT of the abdomen  pelvis - 03/28/2016; renal ultrasound - 03/29/2016 MEDICATIONS: Ciprofloxacin 400 mg IV; The antibiotic was administered in an appropriate time frame prior to skin puncture. ANESTHESIA/SEDATION: Fentanyl 75 mcg IV; Versed 2 mg IV Total Moderate Sedation Time 25 minutes. CONTRAST:  A total of 20 mL Isovue 300 was administered into the renal collecting systems. FLUOROSCOPY TIME:  5 minutes 36 seconds (20 mGy) COMPLICATIONS: None immediate. PROCEDURE: The procedure, risks, benefits, and alternatives were explained to the patient. Questions regarding the procedure were encouraged and answered. The patient understands and consents to the procedure. A timeout was performed prior to the initiation of the procedure. The bilateral flanks were prepped with Betadine in a sterile fashion, and a sterile drape was applied covering the operative field. A sterile gown and sterile gloves were used for the procedure. Local anesthesia was provided with 1% Lidocaine with epinephrine. Ultrasound was used to localize the left kidney. Under direct ultrasound guidance, a 21 gauge needle was advanced into the renal collecting system. An ultrasound image documentation was performed. Access within the collecting system was confirmed with the efflux of urine followed by contrast injection. Over a Nitrex wire, an Accustick set was advanced into the renal collecting system. Contrast injection was injected into the collecting system. Over a guide wire, a 10-French percutaneous nephrostomy catheter was advanced into the collecting system where the coil was formed and locked. Contrast was injected and several sport radiographs were obtained in various obliquities confirming access. The catheter was secured at the skin with a Prolene retention suture and a gravity bag was placed. Attention was now paid towards placement of a right-sided percutaneous nephrostomy catheter. The identical procedure was performed ultimately allowing placement of a 10  French percutaneous nephrostomy catheter via a posterior  inferior calyx with end coiled and locked within the right renal pelvis. The catheter was secured to skin with a Prolene retention suture and connected to a gravity bag. Dressings were placed. The patient tolerated procedure well without immediate postprocedural complication. FINDINGS: Ultrasound scanning demonstrates very mild dilatation of the bilateral renal collecting systems, unchanged to minimally improved compared to the 03/29/2016 examination. Under direct ultrasound guidance, posterior inferior calices were targeted bilaterally allowing placement of bilateral 10-French percutaneous nephrostomy catheter under intermittent fluoroscopic guidance. Contrast injection confirmed appropriate positioning. IMPRESSION: Successful ultrasound and fluoroscopic guided placement of bilateral 10 French PCNs. Electronically Signed   By: Sandi Mariscal M.D.   On: 04/02/2016 16:38    Labs:  CBC:  Recent Labs  03/30/16 0301 03/31/16 0307 04/01/16 0523 04/02/16 1232 04/05/16 0908  WBC 10.7* 12.2* 9.9  --  8.6  HGB 8.9* 8.2* 9.0*  --  8.3*  HCT 28.8* 27.0* 29.2*  --  27.1*  PLT 154 160 176 195 175    COAGS:  Recent Labs  03/28/16 2156 04/02/16 0619 04/02/16 1232  INR 1.09 >10.00* 1.15  APTT 42*  --  32    BMP:  Recent Labs  04/02/16 0619 04/03/16 0410 04/04/16 0759 04/05/16 0908  NA 138 139 141 144  K 5.9* 5.6* 5.5* 4.3  CL 115* 114* 120* 122*  CO2 16* 17* 14* 15*  GLUCOSE 80 93 76 173*  BUN 67* 61* 48* 36*  CALCIUM 8.6* 8.8* 8.5* 8.6*  CREATININE 3.55* 3.20* 2.60* 2.20*  GFRNONAA 14* 16* 21* 25*  GFRAA 17* 19* 24* 29*    LIVER FUNCTION TESTS:  Recent Labs  03/28/16 1007 03/30/16 0301  04/02/16 0619 04/03/16 0410 04/04/16 0759 04/05/16 0908  BILITOT 0.9 0.3  --   --   --   --  0.3  AST 38 36  --   --   --   --  66*  ALT 62 53  --   --   --   --  75*  ALKPHOS 61 48  --   --   --   --  49  PROT 7.2 5.8*  --   --    --   --  5.8*  ALBUMIN 3.1* 2.3*  < > 2.3* 2.3* 2.4* 2.4*  < > = values in this interval not displayed.  Assessment and Plan: Prostate cancer/bilat hydronephrosis/acute on CKD; s/p bilat PCN's 9/11; AF; WBC 8.6, hgb 8.3(9.0); creat 2.2(2.6); cont current tx; other plans as per IM/urology   Electronically Signed: D. Rowe Robert 04/05/2016, 9:54 AM   I spent a total of 15 minutes at the the patient's bedside AND on the patient's hospital floor or unit, greater than 50% of which was counseling/coordinating care for bilateral perc nephrostomies

## 2016-04-05 NOTE — Progress Notes (Signed)
Pt leaving with ambulance at this time.  No s/s of any acute distress.  No c/o pain.  Report already given by [facility] RN and other dc documentation.

## 2016-04-05 NOTE — Progress Notes (Signed)
Serum creatinine 2.2 Follow-up with urology as an outpatient

## 2016-04-09 ENCOUNTER — Ambulatory Visit: Payer: Medicare Other | Admitting: Family Medicine

## 2016-04-10 ENCOUNTER — Non-Acute Institutional Stay (SKILLED_NURSING_FACILITY): Payer: Medicare Other | Admitting: Internal Medicine

## 2016-04-10 ENCOUNTER — Encounter: Payer: Self-pay | Admitting: Internal Medicine

## 2016-04-10 DIAGNOSIS — E785 Hyperlipidemia, unspecified: Secondary | ICD-10-CM

## 2016-04-10 DIAGNOSIS — N3000 Acute cystitis without hematuria: Secondary | ICD-10-CM

## 2016-04-10 DIAGNOSIS — E46 Unspecified protein-calorie malnutrition: Secondary | ICD-10-CM

## 2016-04-10 DIAGNOSIS — Z933 Colostomy status: Secondary | ICD-10-CM

## 2016-04-10 DIAGNOSIS — R911 Solitary pulmonary nodule: Secondary | ICD-10-CM

## 2016-04-10 DIAGNOSIS — G894 Chronic pain syndrome: Secondary | ICD-10-CM

## 2016-04-10 DIAGNOSIS — R5381 Other malaise: Secondary | ICD-10-CM

## 2016-04-10 DIAGNOSIS — I10 Essential (primary) hypertension: Secondary | ICD-10-CM | POA: Diagnosis not present

## 2016-04-10 DIAGNOSIS — N189 Chronic kidney disease, unspecified: Secondary | ICD-10-CM

## 2016-04-10 DIAGNOSIS — I25119 Atherosclerotic heart disease of native coronary artery with unspecified angina pectoris: Secondary | ICD-10-CM

## 2016-04-10 DIAGNOSIS — E875 Hyperkalemia: Secondary | ICD-10-CM

## 2016-04-10 DIAGNOSIS — N1330 Unspecified hydronephrosis: Secondary | ICD-10-CM | POA: Diagnosis not present

## 2016-04-10 DIAGNOSIS — N179 Acute kidney failure, unspecified: Secondary | ICD-10-CM

## 2016-04-10 DIAGNOSIS — D638 Anemia in other chronic diseases classified elsewhere: Secondary | ICD-10-CM | POA: Diagnosis not present

## 2016-04-10 NOTE — Progress Notes (Signed)
LOCATION: [facility]  PCP: [name], MD   Code Status: Full Code  Goals of care: Advanced Directive information Advanced Directives 03/28/2016  Does patient have an advance directive? No  Would patient like information on creating an advanced directive? No - patient declined information       Extended Emergency Contact Information Primary Emergency Contact: [name] of [place] Mobile Phone: [phone] Relation: Spouse Secondary Emergency Contact: Tomlinson,Deborah Address: [address]          [place], [place] [address] [name] of [place] Phone: [phone] Mobile Phone: [phone] Relation: Daughter   No Known Allergies  Chief Complaint  Patient presents with  . New Admit To SNF    New Admission Visit     HPI:  Patient is a 82 y.o. male seen today for short term rehabilitation post hospital admission from 03/28/16-04/05/16 with generalized weakness and decreased urine output. He had acute on chronic renal failure with obstructive uropathy and hyperkalemia. CT abdomen and pelvis showed bilateral hydroureteronephrosis right > left. He had percutaneous nephrostomy tube place and kayexylate was given. 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 He is seen in his room today with his daughter at bedside.   Review of Systems:  Constitutional: Negative for fever, chills, diaphoresis. Energy level is slowly coming back. HENT: Negative for headache, congestion, nasal discharge, difficulty swallowing.   Eyes: Negative for blurred vision, double vision and discharge.  Respiratory: Negative for cough, shortness of breath and wheezing.   Cardiovascular: Negative for chest pain, palpitation, leg swelling.  Gastrointestinal: Negative for heartburn, nausea, vomiting, abdominal pain. Genitourinary: Negative for flank pain.    Musculoskeletal: Negative for back pain, fall in the faciity.  Skin: Negative for itching, rash.  Neurological: Positive for dizziness with change of position. Psychiatric/Behavioral: Negative for depression.   Past Medical History:  Diagnosis Date  . Blood in stool   . Blood transfusion without reported diagnosis   . CAD (coronary artery disease)    a. 2000 - Says he had CABG x 3 or 4.  . Chicken pox   . Colon polyp   . Elevated blood pressure reading   . Glaucoma   . Heart murmur   . Hx: UTI (urinary tract infection)   . Hyperlipidemia   . Prostate cancer ([place])   . Valvular heart disease    a. 2005 s/p Valve replacement - presumably bioprosthetic (not on anticoagulation).  He doesn't know which valve.   Past Surgical History:  Procedure Laterality Date  . CORONARY ARTERY BYPASS GRAFT    . IR GENERIC HISTORICAL  04/02/2016   IR NEPHROSTOMY PLACEMENT LEFT 04/02/2016 Sandi Mariscal, MD [facility]  . IR GENERIC HISTORICAL  04/02/2016   IR NEPHROSTOMY PLACEMENT RIGHT 04/02/2016 Sandi Mariscal, MD [facility]  . LUNG SURGERY    . PROSTATE SURGERY     Social History:   reports that he quit smoking about 44 years ago. He has never used smokeless tobacco. He reports that he does not drink alcohol or use drugs.  Family History  Problem Relation Age of Onset  . Heart disease Father     Pt says his brother had heart infection and died suddenly.    Medications:   Medication List       Accurate as of 04/10/16 11:11 AM. Always use your most recent med list.  albuterol (2.5 MG/3ML) 0.083% nebulizer solution Commonly known as:  PROVENTIL Take 3 mLs (2.5 mg total) by nebulization every 6 (six) hours as needed for wheezing or shortness of breath.   atorvastatin 20 MG tablet Commonly known as:  LIPITOR Take 20 mg by mouth daily.   cefTRIAXone 1 g in dextrose 5 % 50 mL Inject 1 g into the vein daily.   cloNIDine HCl 0.1 MG Tb12 ER tablet Commonly known as:  KAPVAY Take  0.1 mg by mouth 2 (two) times daily.   HYDROcodone-acetaminophen 5-325 MG tablet Commonly known as:  NORCO/VICODIN Take 1 tablet by mouth every 6 (six) hours as needed.   metoprolol tartrate 25 MG tablet Commonly known as:  LOPRESSOR Take 0.5 tablets (12.5 mg total) by mouth 2 (two) times daily.   nitroGLYCERIN 0.4 MG SL tablet Commonly known as:  NITROSTAT Place 0.4 mg under the tongue every 5 (five) minutes as needed for chest pain.   sodium bicarbonate 650 MG tablet Take 650 mg by mouth 4 (four) times daily.       Immunizations:  There is no immunization history on file for this patient.   Physical Exam:  Vitals:   04/10/16 1108  BP: (!) 142/57  Pulse: 70  Resp: 20  Temp: 98.6 F (37 C)  TempSrc: Oral  SpO2: 100%  Weight: 123 lb 14.4 oz (56.2 kg)  Height: 5\' 8"  (1.727 m)   Body mass index is 18.84 kg/m.  General- elderly frail male, thin built, in no acute distress Head- normocephalic, atraumatic Nose- no maxillary or frontal sinus tenderness, no nasal discharge Throat- dry mucus membrane, poor dentition  Eyes- PERRLA, EOMI, no pallor, no icterus, no discharge, normal conjunctiva, normal sclera Neck- no cervical lymphadenopathy Cardiovascular- normal s1,s2, no murmur, no leg edema Respiratory- bilateral clear to auscultation, no wheeze, no rhonchi, no crackles, no use of accessory muscles Abdomen- bowel sounds present, soft, non tender, colostomy bag in place and suprapubic catheter insertion site clean with clear urine being drained to the bag Musculoskeletal- able to move all 4 extremities, generalized weakness Neurological- alert and oriented to person, place and time Skin- warm and dry Psychiatry- normal mood and affect    Labs reviewed: Basic Metabolic Panel:  Recent Labs  03/29/16 1113  03/30/16 0301 03/31/16 0307  04/03/16 0410 04/04/16 0759 04/05/16 0908  NA 144  < > 135 137  < > 139 141 144  K 5.9*  < > 5.2* 5.4*  < > 5.6* 5.5* 4.3    CL 112*  < > 104 107  < > 114* 120* 122*  CO2 22  < > 22 22  < > 17* 14* 15*  GLUCOSE 116*  < > 146* 108*  < > 93 76 173*  BUN 68*  < > 60* 66*  < > 61* 48* 36*  CREATININE 3.42*  < > 3.28* 3.39*  < > 3.20* 2.60* 2.20*  CALCIUM 9.7  < > 8.3* 8.2*  < > 8.8* 8.5* 8.6*  MG 1.5*  --  1.7 1.7  --   --   --   --   PHOS  --   --   --  5.0*  < > 4.9* 3.8 3.2  < > = values in this interval not displayed. Liver Function Tests:  Recent Labs  03/28/16 1007 03/30/16 0301  04/03/16 0410 04/04/16 0759 04/05/16 0908  AST 38 36  --   --   --  66*  ALT 62 53  --   --   --  75*  ALKPHOS 61 48  --   --   --  49  BILITOT 0.9 0.3  --   --   --  0.3  PROT 7.2 5.8*  --   --   --  5.8*  ALBUMIN 3.1* 2.3*  < > 2.3* 2.4* 2.4*  < > = values in this interval not displayed. No results for input(s): LIPASE, AMYLASE in the last 8760 hours. No results for input(s): AMMONIA in the last 8760 hours. CBC:  Recent Labs  03/29/16 1113 03/30/16 0301 03/31/16 0307 04/01/16 0523 04/02/16 1232 04/05/16 0908  WBC 13.4* 10.7* 12.2* 9.9  --  8.6  NEUTROABS 11.6* 8.9* 10.1*  --   --   --   HGB 10.2* 8.9* 8.2* 9.0*  --  8.3*  HCT 33.7* 28.8* 27.0* 29.2*  --  27.1*  MCV 91.3 91.1 91.5 91.8  --  93.1  PLT 193 154 160 176 195 175   Cardiac Enzymes:  Recent Labs  03/29/16 0212 03/30/16 0301 03/30/16 0857  TROPONINI 0.04* 0.03* 0.03*   BNP: Invalid input(s): POCBNP CBG: No results for input(s): GLUCAP in the last 8760 hours.  Radiological Exams: Ct Abdomen Pelvis Wo Contrast  Result Date: 03/28/2016 CLINICAL DATA:  "Patient presents with his daughter who assists with the history of present illness.Patient presents with concern of discomfort about a suprapubic catheter site.He notes that over the past day he has had pain persistently in this area, and today, noticed decreased urine production.No change in the consistency of urine, which began to be produced soon after he awoke, was upright.No new fever,  chills.Patient has a notable history of recent admission to a hospital in [place] for urinary tract infection, discharged to a nursing facility.One week ago he was discharged from that facility, now moved to this area, to reside with his daughter." EXAM: CT ABDOMEN AND PELVIS WITHOUT CONTRAST TECHNIQUE: Multidetector CT imaging of the abdomen and pelvis was performed following the standard protocol without IV contrast. COMPARISON:  None. FINDINGS: Lower chest: In the left lower lobe there is a 10 x 7 mm nodule (8.5 mm mean diameter) on image 4 of series 6. Fairly dense 6 mm nodule lies in the posterior right middle lobe, image 4, series 6. Minor subsegmental atelectasis at the posterior lung bases. Heart normal in size. Hepatobiliary: Normal liver. Dependent density in the gallstone consistent with small layering stones. No wall thickening or evidence of acute cholecystitis. No bile duct dilation. Pancreas: Unremarkable. Spleen: Small calcifications consistent with healed granuloma. Otherwise unremarkable. Adrenals/Urinary Tract: No adrenal masses. Moderate right and mild to moderate left hydronephrosis. No renal masses. No intrarenal stones. Moderate hydroureter, right somewhat greater than left. No ureteral stone. Bladder is mostly decompressed with a suprapubic catheter. Bladder is elevated by an enlarged prostate. Stomach/Bowel: Stomach and small bowel are unremarkable. There is an ileostomy in the right mid abdomen. Visualize colon is decompressed. No bowel obstruction. No bowel wall thickening or inflammation. Vascular/Lymphatic: Diffuse aortic ectasia. Maximum AP diameter of the infrarenal aorta is 2.5 cm. There is atherosclerotic calcification along the aorta and its branch vessels. No enlarged lymph nodes. Reproductive: Prostate gland is enlarged measuring 5.0 x 4.9 x 5.5 cm. Other: No ascites.  No abdominal wall mass or collection. Musculoskeletal: Degenerative changes noted of the lumbar spine.  Arthropathic changes noted of the hips mostly on the right. Bones are demineralized. No osteoblastic or discrete osteolytic lesions. IMPRESSION: 1. Bilateral hydroureteronephrosis. No ureteral stone. Bladder is mostly decompressed with  a suprapubic Foley catheter, well positioned. 2. Enlarged prostate gland. 3. Gallstones without evidence of acute cholecystitis. 4. Aortic atherosclerosis. Aortic ectasia. Ectatic abdominal aorta at risk for aneurysm development. Recommend followup by ultrasound in 5 years. This recommendation follows ACR consensus guidelines: White Paper of the ACR Incidental Findings Committee II on Vascular Findings. J Am Coll Radiol 2013; 10:789-794. 5. Lung base nodules, largest with a mean diameter of 8.5 mm. Non-contrast chest CT at 3-6 months is recommended. If the nodules are stable at time of repeat CT, then future CT at 18-24 months (from today's scan) is considered optional for low-risk patients, but is recommended for high-risk patients. This recommendation follows the consensus statement: Guidelines for Management of Incidental Pulmonary Nodules Detected on CT Images:From the Fleischner Society 2017; published online before print (10.1148/radiol.SG:5268862). Electronically Signed   By: Lajean Manes M.D.   On: 03/28/2016 15:31   US Renal  Result Date: 03/29/2016 CLINICAL DATA:  Acute on chronic renal failure EXAM: RENAL / URINARY TRACT ULTRASOUND COMPLETE COMPARISON:  03/28/2016 FINDINGS: Right Kidney: Length: 9.9 cm.  Mild to moderate hydronephrosis is noted. Left Kidney: Length: 9.5 cm. Mild to moderate hydronephrosis is noted slightly less than that on the right. Bladder: Decompressed by suprapubic catheter IMPRESSION: Mild to moderate hydronephrosis bilaterally right slightly greater than left. This is stable from the recent CT. Electronically Signed   By: Inez Catalina M.D.   On: 03/29/2016 14:24   Ir Nephrostomy Placement Left  Result Date: 04/02/2016 INDICATION: History of  prostate cancer with chronic suprapubic Foley catheter, now with acute on chronic elevation of serum creatinine with cross-sectional imaging demonstrated mild-to-moderate bilateral pelvicaliectasis. Request made for placement of bilateral percutaneous nephrostomy catheters for renal preservation purposes. EXAM: 1. ULTRASOUND GUIDANCE FOR PUNCTURE OF THE BILATERAL RENAL COLLECTING SYSTEMS 2. BILATERAL PERCUTANEOUS NEPHROSTOMY TUBE PLACEMENT. COMPARISON:  CT of the abdomen pelvis - 03/28/2016; renal ultrasound - 03/29/2016 MEDICATIONS: Ciprofloxacin 400 mg IV; The antibiotic was administered in an appropriate time frame prior to skin puncture. ANESTHESIA/SEDATION: Fentanyl 75 mcg IV; Versed 2 mg IV Total Moderate Sedation Time 25 minutes. CONTRAST:  A total of 20 mL Isovue 300 was administered into the renal collecting systems. FLUOROSCOPY TIME:  5 minutes 36 seconds (20 mGy) COMPLICATIONS: None immediate. PROCEDURE: The procedure, risks, benefits, and alternatives were explained to the patient. Questions regarding the procedure were encouraged and answered. The patient understands and consents to the procedure. A timeout was performed prior to the initiation of the procedure. The bilateral flanks were prepped with Betadine in a sterile fashion, and a sterile drape was applied covering the operative field. A sterile gown and sterile gloves were used for the procedure. Local anesthesia was provided with 1% Lidocaine with epinephrine. Ultrasound was used to localize the left kidney. Under direct ultrasound guidance, a 21 gauge needle was advanced into the renal collecting system. An ultrasound image documentation was performed. Access within the collecting system was confirmed with the efflux of urine followed by contrast injection. Over a Nitrex wire, an Accustick set was advanced into the renal collecting system. Contrast injection was injected into the collecting system. Over a guide wire, a 10-French percutaneous  nephrostomy catheter was advanced into the collecting system where the coil was formed and locked. Contrast was injected and several sport radiographs were obtained in various obliquities confirming access. The catheter was secured at the skin with a Prolene retention suture and a gravity bag was placed. Attention was now paid towards placement of a right-sided percutaneous  nephrostomy catheter. The identical procedure was performed ultimately allowing placement of a 10 French percutaneous nephrostomy catheter via a posterior inferior calyx with end coiled and locked within the right renal pelvis. The catheter was secured to skin with a Prolene retention suture and connected to a gravity bag. Dressings were placed. The patient tolerated procedure well without immediate postprocedural complication. FINDINGS: Ultrasound scanning demonstrates very mild dilatation of the bilateral renal collecting systems, unchanged to minimally improved compared to the 03/29/2016 examination. Under direct ultrasound guidance, posterior inferior calices were targeted bilaterally allowing placement of bilateral 10-French percutaneous nephrostomy catheter under intermittent fluoroscopic guidance. Contrast injection confirmed appropriate positioning. IMPRESSION: Successful ultrasound and fluoroscopic guided placement of bilateral 10 French PCNs. Electronically Signed   By: Sandi Mariscal M.D.   On: 04/02/2016 16:38   Ir Nephrostomy Placement Right  Result Date: 04/02/2016 INDICATION: History of prostate cancer with chronic suprapubic Foley catheter, now with acute on chronic elevation of serum creatinine with cross-sectional imaging demonstrated mild-to-moderate bilateral pelvicaliectasis. Request made for placement of bilateral percutaneous nephrostomy catheters for renal preservation purposes. EXAM: 1. ULTRASOUND GUIDANCE FOR PUNCTURE OF THE BILATERAL RENAL COLLECTING SYSTEMS 2. BILATERAL PERCUTANEOUS NEPHROSTOMY TUBE PLACEMENT.  COMPARISON:  CT of the abdomen pelvis - 03/28/2016; renal ultrasound - 03/29/2016 MEDICATIONS: Ciprofloxacin 400 mg IV; The antibiotic was administered in an appropriate time frame prior to skin puncture. ANESTHESIA/SEDATION: Fentanyl 75 mcg IV; Versed 2 mg IV Total Moderate Sedation Time 25 minutes. CONTRAST:  A total of 20 mL Isovue 300 was administered into the renal collecting systems. FLUOROSCOPY TIME:  5 minutes 36 seconds (20 mGy) COMPLICATIONS: None immediate. PROCEDURE: The procedure, risks, benefits, and alternatives were explained to the patient. Questions regarding the procedure were encouraged and answered. The patient understands and consents to the procedure. A timeout was performed prior to the initiation of the procedure. The bilateral flanks were prepped with Betadine in a sterile fashion, and a sterile drape was applied covering the operative field. A sterile gown and sterile gloves were used for the procedure. Local anesthesia was provided with 1% Lidocaine with epinephrine. Ultrasound was used to localize the left kidney. Under direct ultrasound guidance, a 21 gauge needle was advanced into the renal collecting system. An ultrasound image documentation was performed. Access within the collecting system was confirmed with the efflux of urine followed by contrast injection. Over a Nitrex wire, an Accustick set was advanced into the renal collecting system. Contrast injection was injected into the collecting system. Over a guide wire, a 10-French percutaneous nephrostomy catheter was advanced into the collecting system where the coil was formed and locked. Contrast was injected and several sport radiographs were obtained in various obliquities confirming access. The catheter was secured at the skin with a Prolene retention suture and a gravity bag was placed. Attention was now paid towards placement of a right-sided percutaneous nephrostomy catheter. The identical procedure was performed ultimately  allowing placement of a 10 French percutaneous nephrostomy catheter via a posterior inferior calyx with end coiled and locked within the right renal pelvis. The catheter was secured to skin with a Prolene retention suture and connected to a gravity bag. Dressings were placed. The patient tolerated procedure well without immediate postprocedural complication. FINDINGS: Ultrasound scanning demonstrates very mild dilatation of the bilateral renal collecting systems, unchanged to minimally improved compared to the 03/29/2016 examination. Under direct ultrasound guidance, posterior inferior calices were targeted bilaterally allowing placement of bilateral 10-French percutaneous nephrostomy catheter under intermittent fluoroscopic guidance. Contrast injection confirmed appropriate positioning.  IMPRESSION: Successful ultrasound and fluoroscopic guided placement of bilateral 10 French PCNs. Electronically Signed   By: Sandi Mariscal M.D.   On: 04/02/2016 16:38    Assessment/Plan  Physical deconditioning Will have him work with physical therapy and occupational therapy team to help with gait training and muscle strengthening exercises.fall precautions. Skin care. Encourage to be out of bed.   Bilateral hydronephrosis With obstructive uropathy, he is s/p percutaneous nephrostomy tube placement by IR and is to follow with urology as outpaitnet. Has suprapubic catheter in place. To monitor urine output. Continue his sodium bicarbonate  Acute on ckd stage 2 Monitor bmp. Hydration to be maintained.   Hyperkalemia Monitor bmp. cutrrently on sodium bicarbonate  Protein calorie malnutrition Monitor po intake and weight. Get RD consult to evaluate for protein supplement  Lung nodule Will need f/u CT chest in 3-6 month as outpatient  enterovesicular fistula S/p colostomy bag, monitor clinically  Anemia of chronic disease Monitor cbc  Colostomy in place Continue colostomy site care  UTI Continue and  complete his course of rocephin on 04/11/16  HLD Continue lipitor  HTN Monitor bp reading, continue lopressor 12.5 mg bid with clonidine 0.1 mg bid  CAD S/p CABG. Chest pain free. Continue b blocker and statin with prn NTG.   Chronic pain On norco on need basis, no changes made, monitor   Goals of care: short term rehabilitation   Labs/tests ordered: cbc, cmp, mg  Family/ staff Communication: reviewed care plan with patient, his daughter and nursing supervisor    Blanchie Serve, MD Internal Medicine [facility] [address] [place], [place] [address] Cell Phone (Monday-Friday 8 am - 5 pm): [phone] On Call: [phone] and follow prompts after 5 pm and on weekends Office Phone: [phone] Office Fax: [phone]

## 2016-04-11 LAB — CBC AND DIFFERENTIAL
HCT: 25 % — AB (ref 41–53)
Hemoglobin: 7.7 g/dL — AB (ref 13.5–17.5)
PLATELETS: 185 10*3/uL (ref 150–399)
WBC: 9.6 10^3/mL

## 2016-04-13 ENCOUNTER — Encounter: Payer: Self-pay | Admitting: Internal Medicine

## 2016-04-13 ENCOUNTER — Non-Acute Institutional Stay (SKILLED_NURSING_FACILITY): Payer: Medicare Other | Admitting: Internal Medicine

## 2016-04-13 DIAGNOSIS — D62 Acute posthemorrhagic anemia: Secondary | ICD-10-CM

## 2016-04-13 DIAGNOSIS — N3000 Acute cystitis without hematuria: Secondary | ICD-10-CM

## 2016-04-13 NOTE — Progress Notes (Signed)
LOCATION: [facility]  PCP: [name], MD   Code Status: Full Code  Goals of care: Advanced Directive information Advanced Directives 03/28/2016  Does patient have an advance directive? No  Would patient like information on creating an advanced directive? No - patient declined information       Extended Emergency Contact Information Primary Emergency Contact: [name] of [place] Mobile Phone: [phone] Relation: Spouse Secondary Emergency Contact: Tomlinson,Deborah Address: [address]          [place], [place] [address] [name] of [place] Phone: [phone] Mobile Phone: [phone] Relation: Daughter   No Known Allergies  Chief Complaint  Patient presents with  . Acute Visit    Drop in hemoglobin     HPI:  Patient is a 82 y.o. male seen today for acute visit. He has a drop in his hemoglobin on lab review. He feels fair in terms of energy. He has some dyspnea on exertion. He denies any hematemesis, hematuria and blood in stool. He complaints of occasional dizziness. Denies heartburn and abdominal pain.  Of note, he is here for short term rehabilitation post hospital admission from 03/28/16-04/05/16 with acute on chronic renal failure with obstructive uropathy from bilateral hydroureteronephrosis right > left. He is s/p percutaneous nephrostomy tube.  Review of Systems:  Constitutional: Negative for fever, chills HENT: Negative for headache, congestion Eyes: Negative for blurred vision Respiratory: Negative for cough, wheezing.   Cardiovascular: Negative for chest pain, palpitation, leg swelling.  Gastrointestinal: Negative for heartburn, nausea, vomiting, abdominal pain. Musculoskeletal: Negative for fall in the faciity.  Skin: Negative for itching, rash.  Neurological: Positive for dizziness with change of position. Psychiatric/Behavioral: Negative for depression.   Past Medical History:  Diagnosis Date  . Blood in  stool   . Blood transfusion without reported diagnosis   . CAD (coronary artery disease)    a. 2000 - Says he had CABG x 3 or 4.  . Chicken pox   . Colon polyp   . Elevated blood pressure reading   . Glaucoma   . Heart murmur   . Hx: UTI (urinary tract infection)   . Hyperlipidemia   . Prostate cancer ([place])   . Valvular heart disease    a. 2005 s/p Valve replacement - presumably bioprosthetic (not on anticoagulation).  He doesn't know which valve.   Past Surgical History:  Procedure Laterality Date  . CORONARY ARTERY BYPASS GRAFT    . IR GENERIC HISTORICAL  04/02/2016   IR NEPHROSTOMY PLACEMENT LEFT 04/02/2016 Sandi Mariscal, MD [facility]  . IR GENERIC HISTORICAL  04/02/2016   IR NEPHROSTOMY PLACEMENT RIGHT 04/02/2016 Sandi Mariscal, MD [facility]  . LUNG SURGERY    . PROSTATE SURGERY      Medications:   Medication List       Accurate as of 04/13/16  2:24 PM. Always use your most recent med list.          albuterol (2.5 MG/3ML) 0.083% nebulizer solution Commonly known as:  PROVENTIL Take 3 mLs (2.5 mg total) by nebulization every 6 (six) hours as needed for wheezing or shortness of breath.   atorvastatin 20 MG tablet Commonly known as:  LIPITOR Take 20 mg by mouth daily.   cloNIDine HCl 0.1 MG Tb12 ER tablet Commonly known as:  KAPVAY Take 0.1 mg by mouth 2 (two) times daily.   feeding supplement (PRO-STAT SUGAR FREE 64) Liqd Take 30 mLs by mouth daily. Stop date 05/10/16   HYDROcodone-acetaminophen 5-325  MG tablet Commonly known as:  NORCO/VICODIN Take 1 tablet by mouth every 6 (six) hours as needed.   metoprolol tartrate 25 MG tablet Commonly known as:  LOPRESSOR Take 0.5 tablets (12.5 mg total) by mouth 2 (two) times daily.   nitroGLYCERIN 0.4 MG SL tablet Commonly known as:  NITROSTAT Place 0.4 mg under the tongue every 5 (five) minutes as needed for chest pain.   sodium bicarbonate 650 MG tablet Take 650 mg by mouth 4 (four) times daily.   sodium  chloride 0.9 % injection Inject into the vein as needed.       Immunizations:  There is no immunization history on file for this patient.   Physical Exam:  Vitals:   04/13/16 1419  BP: 124/74  Pulse: 82  Temp: 99 F (37.2 C)  TempSrc: Oral  SpO2: 98%  Weight: 123 lb 14.4 oz (56.2 kg)  Height: 5\' 8"  (1.727 m)   Body mass index is 18.84 kg/m.  General- elderly frail male, thin built, in no acute distress Head- normocephalic, atraumatic Eyes- PERRLA, EOMI, + pallor, no icterus Neck- no cervical lymphadenopathy Cardiovascular- normal s1,s2, no murmur, no leg edema Respiratory- bilateral clear to auscultation Abdomen- bowel sounds present, soft, non tender, colostomy bag in place and suprapubic catheter insertion site clean with clear urine being drained to the bag Musculoskeletal- able to move all 4 extremities, generalized weakness Neurological- alert and oriented to person, place and time Skin- warm and dry   Labs reviewed: Basic Metabolic Panel:  Recent Labs  03/29/16 1113  03/30/16 0301 03/31/16 0307  04/03/16 0410 04/04/16 0759 04/05/16 0908  NA 144  < > 135 137  < > 139 141 144  K 5.9*  < > 5.2* 5.4*  < > 5.6* 5.5* 4.3  CL 112*  < > 104 107  < > 114* 120* 122*  CO2 22  < > 22 22  < > 17* 14* 15*  GLUCOSE 116*  < > 146* 108*  < > 93 76 173*  BUN 68*  < > 60* 66*  < > 61* 48* 36*  CREATININE 3.42*  < > 3.28* 3.39*  < > 3.20* 2.60* 2.20*  CALCIUM 9.7  < > 8.3* 8.2*  < > 8.8* 8.5* 8.6*  MG 1.5*  --  1.7 1.7  --   --   --   --   PHOS  --   --   --  5.0*  < > 4.9* 3.8 3.2  < > = values in this interval not displayed. Liver Function Tests:  Recent Labs  03/28/16 1007 03/30/16 0301  04/03/16 0410 04/04/16 0759 04/05/16 0908  AST 38 36  --   --   --  66*  ALT 62 53  --   --   --  75*  ALKPHOS 61 48  --   --   --  49  BILITOT 0.9 0.3  --   --   --  0.3  PROT 7.2 5.8*  --   --   --  5.8*  ALBUMIN 3.1* 2.3*  < > 2.3* 2.4* 2.4*  < > = values in this  interval not displayed. No results for input(s): LIPASE, AMYLASE in the last 8760 hours. No results for input(s): AMMONIA in the last 8760 hours. CBC:  Recent Labs  03/29/16 1113 03/30/16 0301 03/31/16 0307 04/01/16 0523 04/02/16 1232 04/05/16 0908 04/11/16  WBC 13.4* 10.7* 12.2* 9.9  --  8.6 9.6  NEUTROABS 11.6* 8.9*  10.1*  --   --   --   --   HGB 10.2* 8.9* 8.2* 9.0*  --  8.3* 7.7*  HCT 33.7* 28.8* 27.0* 29.2*  --  27.1* 25*  MCV 91.3 91.1 91.5 91.8  --  93.1  --   PLT 193 154 160 176 195 175 185   Cardiac Enzymes:  Recent Labs  03/29/16 0212 03/30/16 0301 03/30/16 0857  TROPONINI 0.04* 0.03* 0.03*   BNP: Invalid input(s): POCBNP CBG: No results for input(s): GLUCAP in the last 8760 hours.  Radiological Exams: Ct Abdomen Pelvis Wo Contrast  Result Date: 03/28/2016 CLINICAL DATA:  "Patient presents with his daughter who assists with the history of present illness.Patient presents with concern of discomfort about a suprapubic catheter site.He notes that over the past day he has had pain persistently in this area, and today, noticed decreased urine production.No change in the consistency of urine, which began to be produced soon after he awoke, was upright.No new fever, chills.Patient has a notable history of recent admission to a hospital in [place] for urinary tract infection, discharged to a nursing facility.One week ago he was discharged from that facility, now moved to this area, to reside with his daughter." EXAM: CT ABDOMEN AND PELVIS WITHOUT CONTRAST TECHNIQUE: Multidetector CT imaging of the abdomen and pelvis was performed following the standard protocol without IV contrast. COMPARISON:  None. FINDINGS: Lower chest: In the left lower lobe there is a 10 x 7 mm nodule (8.5 mm mean diameter) on image 4 of series 6. Fairly dense 6 mm nodule lies in the posterior right middle lobe, image 4, series 6. Minor subsegmental atelectasis at the posterior lung bases. Heart  normal in size. Hepatobiliary: Normal liver. Dependent density in the gallstone consistent with small layering stones. No wall thickening or evidence of acute cholecystitis. No bile duct dilation. Pancreas: Unremarkable. Spleen: Small calcifications consistent with healed granuloma. Otherwise unremarkable. Adrenals/Urinary Tract: No adrenal masses. Moderate right and mild to moderate left hydronephrosis. No renal masses. No intrarenal stones. Moderate hydroureter, right somewhat greater than left. No ureteral stone. Bladder is mostly decompressed with a suprapubic catheter. Bladder is elevated by an enlarged prostate. Stomach/Bowel: Stomach and small bowel are unremarkable. There is an ileostomy in the right mid abdomen. Visualize colon is decompressed. No bowel obstruction. No bowel wall thickening or inflammation. Vascular/Lymphatic: Diffuse aortic ectasia. Maximum AP diameter of the infrarenal aorta is 2.5 cm. There is atherosclerotic calcification along the aorta and its branch vessels. No enlarged lymph nodes. Reproductive: Prostate gland is enlarged measuring 5.0 x 4.9 x 5.5 cm. Other: No ascites.  No abdominal wall mass or collection. Musculoskeletal: Degenerative changes noted of the lumbar spine. Arthropathic changes noted of the hips mostly on the right. Bones are demineralized. No osteoblastic or discrete osteolytic lesions. IMPRESSION: 1. Bilateral hydroureteronephrosis. No ureteral stone. Bladder is mostly decompressed with a suprapubic Foley catheter, well positioned. 2. Enlarged prostate gland. 3. Gallstones without evidence of acute cholecystitis. 4. Aortic atherosclerosis. Aortic ectasia. Ectatic abdominal aorta at risk for aneurysm development. Recommend followup by ultrasound in 5 years. This recommendation follows ACR consensus guidelines: White Paper of the ACR Incidental Findings Committee II on Vascular Findings. J Am Coll Radiol 2013; 10:789-794. 5. Lung base nodules, largest with a mean  diameter of 8.5 mm. Non-contrast chest CT at 3-6 months is recommended. If the nodules are stable at time of repeat CT, then future CT at 18-24 months (from today's scan) is considered optional for low-risk patients,  but is recommended for high-risk patients. This recommendation follows the consensus statement: Guidelines for Management of Incidental Pulmonary Nodules Detected on CT Images:From the Fleischner Society 2017; published online before print (10.1148/radiol.SG:5268862). Electronically Signed   By: Lajean Manes M.D.   On: 03/28/2016 15:31   US Renal  Result Date: 03/29/2016 CLINICAL DATA:  Acute on chronic renal failure EXAM: RENAL / URINARY TRACT ULTRASOUND COMPLETE COMPARISON:  03/28/2016 FINDINGS: Right Kidney: Length: 9.9 cm.  Mild to moderate hydronephrosis is noted. Left Kidney: Length: 9.5 cm. Mild to moderate hydronephrosis is noted slightly less than that on the right. Bladder: Decompressed by suprapubic catheter IMPRESSION: Mild to moderate hydronephrosis bilaterally right slightly greater than left. This is stable from the recent CT. Electronically Signed   By: Inez Catalina M.D.   On: 03/29/2016 14:24   Ir Nephrostomy Placement Left  Result Date: 04/02/2016 INDICATION: History of prostate cancer with chronic suprapubic Foley catheter, now with acute on chronic elevation of serum creatinine with cross-sectional imaging demonstrated mild-to-moderate bilateral pelvicaliectasis. Request made for placement of bilateral percutaneous nephrostomy catheters for renal preservation purposes. EXAM: 1. ULTRASOUND GUIDANCE FOR PUNCTURE OF THE BILATERAL RENAL COLLECTING SYSTEMS 2. BILATERAL PERCUTANEOUS NEPHROSTOMY TUBE PLACEMENT. COMPARISON:  CT of the abdomen pelvis - 03/28/2016; renal ultrasound - 03/29/2016 MEDICATIONS: Ciprofloxacin 400 mg IV; The antibiotic was administered in an appropriate time frame prior to skin puncture. ANESTHESIA/SEDATION: Fentanyl 75 mcg IV; Versed 2 mg IV Total Moderate  Sedation Time 25 minutes. CONTRAST:  A total of 20 mL Isovue 300 was administered into the renal collecting systems. FLUOROSCOPY TIME:  5 minutes 36 seconds (20 mGy) COMPLICATIONS: None immediate. PROCEDURE: The procedure, risks, benefits, and alternatives were explained to the patient. Questions regarding the procedure were encouraged and answered. The patient understands and consents to the procedure. A timeout was performed prior to the initiation of the procedure. The bilateral flanks were prepped with Betadine in a sterile fashion, and a sterile drape was applied covering the operative field. A sterile gown and sterile gloves were used for the procedure. Local anesthesia was provided with 1% Lidocaine with epinephrine. Ultrasound was used to localize the left kidney. Under direct ultrasound guidance, a 21 gauge needle was advanced into the renal collecting system. An ultrasound image documentation was performed. Access within the collecting system was confirmed with the efflux of urine followed by contrast injection. Over a Nitrex wire, an Accustick set was advanced into the renal collecting system. Contrast injection was injected into the collecting system. Over a guide wire, a 10-French percutaneous nephrostomy catheter was advanced into the collecting system where the coil was formed and locked. Contrast was injected and several sport radiographs were obtained in various obliquities confirming access. The catheter was secured at the skin with a Prolene retention suture and a gravity bag was placed. Attention was now paid towards placement of a right-sided percutaneous nephrostomy catheter. The identical procedure was performed ultimately allowing placement of a 10 French percutaneous nephrostomy catheter via a posterior inferior calyx with end coiled and locked within the right renal pelvis. The catheter was secured to skin with a Prolene retention suture and connected to a gravity bag. Dressings were  placed. The patient tolerated procedure well without immediate postprocedural complication. FINDINGS: Ultrasound scanning demonstrates very mild dilatation of the bilateral renal collecting systems, unchanged to minimally improved compared to the 03/29/2016 examination. Under direct ultrasound guidance, posterior inferior calices were targeted bilaterally allowing placement of bilateral 10-French percutaneous nephrostomy catheter under intermittent fluoroscopic guidance. Contrast  injection confirmed appropriate positioning. IMPRESSION: Successful ultrasound and fluoroscopic guided placement of bilateral 10 French PCNs. Electronically Signed   By: Sandi Mariscal M.D.   On: 04/02/2016 16:38   Ir Nephrostomy Placement Right  Result Date: 04/02/2016 INDICATION: History of prostate cancer with chronic suprapubic Foley catheter, now with acute on chronic elevation of serum creatinine with cross-sectional imaging demonstrated mild-to-moderate bilateral pelvicaliectasis. Request made for placement of bilateral percutaneous nephrostomy catheters for renal preservation purposes. EXAM: 1. ULTRASOUND GUIDANCE FOR PUNCTURE OF THE BILATERAL RENAL COLLECTING SYSTEMS 2. BILATERAL PERCUTANEOUS NEPHROSTOMY TUBE PLACEMENT. COMPARISON:  CT of the abdomen pelvis - 03/28/2016; renal ultrasound - 03/29/2016 MEDICATIONS: Ciprofloxacin 400 mg IV; The antibiotic was administered in an appropriate time frame prior to skin puncture. ANESTHESIA/SEDATION: Fentanyl 75 mcg IV; Versed 2 mg IV Total Moderate Sedation Time 25 minutes. CONTRAST:  A total of 20 mL Isovue 300 was administered into the renal collecting systems. FLUOROSCOPY TIME:  5 minutes 36 seconds (20 mGy) COMPLICATIONS: None immediate. PROCEDURE: The procedure, risks, benefits, and alternatives were explained to the patient. Questions regarding the procedure were encouraged and answered. The patient understands and consents to the procedure. A timeout was performed prior to the  initiation of the procedure. The bilateral flanks were prepped with Betadine in a sterile fashion, and a sterile drape was applied covering the operative field. A sterile gown and sterile gloves were used for the procedure. Local anesthesia was provided with 1% Lidocaine with epinephrine. Ultrasound was used to localize the left kidney. Under direct ultrasound guidance, a 21 gauge needle was advanced into the renal collecting system. An ultrasound image documentation was performed. Access within the collecting system was confirmed with the efflux of urine followed by contrast injection. Over a Nitrex wire, an Accustick set was advanced into the renal collecting system. Contrast injection was injected into the collecting system. Over a guide wire, a 10-French percutaneous nephrostomy catheter was advanced into the collecting system where the coil was formed and locked. Contrast was injected and several sport radiographs were obtained in various obliquities confirming access. The catheter was secured at the skin with a Prolene retention suture and a gravity bag was placed. Attention was now paid towards placement of a right-sided percutaneous nephrostomy catheter. The identical procedure was performed ultimately allowing placement of a 10 French percutaneous nephrostomy catheter via a posterior inferior calyx with end coiled and locked within the right renal pelvis. The catheter was secured to skin with a Prolene retention suture and connected to a gravity bag. Dressings were placed. The patient tolerated procedure well without immediate postprocedural complication. FINDINGS: Ultrasound scanning demonstrates very mild dilatation of the bilateral renal collecting systems, unchanged to minimally improved compared to the 03/29/2016 examination. Under direct ultrasound guidance, posterior inferior calices were targeted bilaterally allowing placement of bilateral 10-French percutaneous nephrostomy catheter under  intermittent fluoroscopic guidance. Contrast injection confirmed appropriate positioning. IMPRESSION: Successful ultrasound and fluoroscopic guided placement of bilateral 10 French PCNs. Electronically Signed   By: Sandi Mariscal M.D.   On: 04/02/2016 16:38    Assessment/Plan  Blood loss anemia Likely post op from placement of nephrostomy tube. Will check FOBT X 3 to rule out gi bleed. Start feso4 325 mg bid and monitor lab and vital signs. Place him on protonix 40 mg daily for GI prophylaxis  UTI Has completed his antibiotic, d/c picc line   Labs/tests ordered: cbc  Family/ staff Communication: reviewed care plan with patient and nursing supervisor    Blanchie Serve, MD Internal  [facility] [place], [place] [address] Cell Phone (Monday-Friday 8 am - 5 pm): [phone] On Call: [phone] and follow prompts after 5 pm and on weekends Office Phone: [phone] Office Fax: [phone]

## 2016-04-17 ENCOUNTER — Non-Acute Institutional Stay (SKILLED_NURSING_FACILITY): Payer: Medicare Other | Admitting: Family

## 2016-04-17 DIAGNOSIS — D509 Iron deficiency anemia, unspecified: Secondary | ICD-10-CM

## 2016-04-17 DIAGNOSIS — R319 Hematuria, unspecified: Secondary | ICD-10-CM | POA: Diagnosis not present

## 2016-04-17 LAB — CBC AND DIFFERENTIAL
HEMATOCRIT: 27 % — AB (ref 41–53)
Hemoglobin: 8.1 g/dL — AB (ref 13.5–17.5)
PLATELETS: 195 10*3/uL (ref 150–399)
WBC: 9.1 10^3/mL

## 2016-04-17 NOTE — Progress Notes (Signed)
Location:  [place] Room Number: 1204 Place of Service:  SNF (31) Provider: Alisa Stjames FNP-C   [name], MD  Patient Care Team: Darreld Mclean, MD as PCP - General (Family Medicine)  Extended Emergency Contact Information Primary Emergency Contact: [name] [place] of [place] Phone: [phone] Relation: Spouse Secondary Emergency Contact: Theola Sequin Address: [address]          [place], [place] [address] [name] of [place] Phone: [phone] Mobile Phone: [phone] Relation: Daughter  Code Status:  Full Code  Goals of care: Advanced Directive information Advanced Directives 03/28/2016  Does patient have an advance directive? No  Would patient like information on creating an advanced directive? No - patient declined information     Chief Complaint  Patient presents with  . Acute Visit    Hematuria     HPI:  Pt is a 82 y.o. male seen today at [facility] for an acute visit for evaluation of blood in the urine. He has a medical history of CKD, Prostate CA,  Bilateral Nephrostomy, Colostomy among others. He is seen in his room today per facility Nurse request . Facility Nurse reports blood tinged urine from the suprapubic Catheter. Clear yellow urine reported from bilateral Nephrostomy tubes.   Past Medical History:  Diagnosis Date  . Blood in stool   . Blood transfusion without reported diagnosis   . CAD (coronary artery disease)    a. 2000 - Says he had CABG x 3 or 4.  . Chicken pox   . Colon polyp   . Elevated blood pressure reading   . Glaucoma   . Heart murmur   . Hx: UTI (urinary tract infection)   . Hyperlipidemia   . Prostate cancer ([place])   . Valvular heart disease    a. 2005 s/p Valve replacement - presumably bioprosthetic (not on anticoagulation).  He doesn't know which valve.   Past Surgical History:  Procedure Laterality Date  . CORONARY ARTERY  BYPASS GRAFT    . IR GENERIC HISTORICAL  04/02/2016   IR NEPHROSTOMY PLACEMENT LEFT 04/02/2016 Sandi Mariscal, MD [facility]  . IR GENERIC HISTORICAL  04/02/2016   IR NEPHROSTOMY PLACEMENT RIGHT 04/02/2016 Sandi Mariscal, MD [facility]  . LUNG SURGERY    . PROSTATE SURGERY      No Known Allergies    Medication List       Accurate as of 04/17/16  3:08 PM. Always use your most recent med list.          albuterol (2.5 MG/3ML) 0.083% nebulizer solution Commonly known as:  PROVENTIL Take 3 mLs (2.5 mg total) by nebulization every 6 (six) hours as needed for wheezing or shortness of breath.   atorvastatin 20 MG tablet Commonly known as:  LIPITOR Take 20 mg by mouth daily.   cloNIDine HCl 0.1 MG Tb12 ER tablet Commonly known as:  KAPVAY Take 0.1 mg by mouth 2 (two) times daily.   feeding supplement (PRO-STAT SUGAR FREE 64) Liqd Take 30 mLs by mouth daily. Stop date 05/10/16   ferrous sulfate 325 (65 FE) MG tablet Take 325 mg by mouth 2 (two) times daily with a meal.   HYDROcodone-acetaminophen 5-325 MG tablet Commonly known as:  NORCO/VICODIN Take 1 tablet by mouth every 6 (six) hours as needed.   metoprolol tartrate 25 MG tablet Commonly known as:  LOPRESSOR Take 0.5 tablets (12.5 mg total) by mouth 2 (two) times daily.  nitroGLYCERIN 0.4 MG SL tablet Commonly known as:  NITROSTAT Place 0.4 mg under the tongue every 5 (five) minutes as needed for chest pain.   pantoprazole 40 MG tablet Commonly known as:  PROTONIX Take 40 mg by mouth daily.   sodium bicarbonate 650 MG tablet Take 650 mg by mouth 4 (four) times daily.       Review of Systems  Constitutional: Negative for activity change, appetite change, chills, fatigue and fever.  HENT: Negative for congestion, rhinorrhea, sinus pressure, sneezing and sore throat.   Eyes: Negative.   Respiratory: Negative for cough, chest tightness, shortness of breath and wheezing.   Cardiovascular: Negative for chest pain,  palpitations and leg swelling.  Gastrointestinal: Negative for abdominal distention, abdominal pain, constipation, diarrhea, nausea and vomiting.       Right lower quadrant colostomy  Genitourinary: Positive for hematuria. Negative for flank pain and urgency.       Bilateral Nephrostomy tubes and Suprapubic Catheter   Skin: Negative for color change, pallor and rash.  Psychiatric/Behavioral: Negative for agitation, confusion, hallucinations and sleep disturbance. The patient is not nervous/anxious.      There is no immunization history on file for this patient. Pertinent  Health Maintenance Due  Topic Date Due  . PNA vac Low Risk Adult (1 of 2 - PCV13) 06/21/1994  . INFLUENZA VACCINE  10/20/2016 (Originally 02/21/2016)   Fall Risk  03/22/2016  Falls in the past year? No      Vitals:   04/17/16 1400  BP: 131/82  Pulse: 62  Resp: 18  Temp: 97.6 F (36.4 C)  SpO2: 97%  Weight: 113 lb (51.3 kg)  Height: 5\' 8"  (1.727 m)   Body mass index is 17.18 kg/m. Physical Exam  Constitutional: He is oriented to person, place, and time.  Thin, Frail Elderly in no acute distress  HENT:  Head: Normocephalic.  Mouth/Throat: Oropharynx is clear and moist. No oropharyngeal exudate.  Eyes: Conjunctivae and EOM are normal. Pupils are equal, round, and reactive to light. Right eye exhibits no discharge. Left eye exhibits no discharge. No scleral icterus.  Neck: Normal range of motion. No JVD present. No thyromegaly present.  Cardiovascular: Normal rate, regular rhythm, normal heart sounds and intact distal pulses.  Exam reveals no gallop and no friction rub.   No murmur heard. Pulmonary/Chest: Effort normal and breath sounds normal. No respiratory distress. He has no wheezes. He has no rales.  Abdominal: Soft. Bowel sounds are normal. He exhibits no distension. There is no tenderness. There is no rebound and no guarding.  RLQ colostomy draining adequate amount of stool   Genitourinary:    Genitourinary Comments: Bilateral Nephrostomy draining adequate amounts of yellow clear urine. Suprapubic Catheter draining small amounts of blood tinged urine.   Musculoskeletal: He exhibits no edema, tenderness or deformity.  Lymphadenopathy:    He has no cervical adenopathy.  Neurological: He is oriented to person, place, and time.  Skin: Skin is warm and dry. No rash noted. No erythema. No pallor.  Psychiatric: He has a normal mood and affect.    Labs reviewed:  Recent Labs  03/29/16 1113  03/30/16 0301 03/31/16 0307  04/03/16 0410 04/04/16 0759 04/05/16 0908  NA 144  < > 135 137  < > 139 141 144  K 5.9*  < > 5.2* 5.4*  < > 5.6* 5.5* 4.3  CL 112*  < > 104 107  < > 114* 120* 122*  CO2 22  < > 22 22  < >  17* 14* 15*  GLUCOSE 116*  < > 146* 108*  < > 93 76 173*  BUN 68*  < > 60* 66*  < > 61* 48* 36*  CREATININE 3.42*  < > 3.28* 3.39*  < > 3.20* 2.60* 2.20*  CALCIUM 9.7  < > 8.3* 8.2*  < > 8.8* 8.5* 8.6*  MG 1.5*  --  1.7 1.7  --   --   --   --   PHOS  --   --   --  5.0*  < > 4.9* 3.8 3.2  < > = values in this interval not displayed.  Recent Labs  03/28/16 1007 03/30/16 0301  04/03/16 0410 04/04/16 0759 04/05/16 0908  AST 38 36  --   --   --  66*  ALT 62 53  --   --   --  75*  ALKPHOS 61 48  --   --   --  49  BILITOT 0.9 0.3  --   --   --  0.3  PROT 7.2 5.8*  --   --   --  5.8*  ALBUMIN 3.1* 2.3*  < > 2.3* 2.4* 2.4*  < > = values in this interval not displayed.  Recent Labs  03/29/16 1113 03/30/16 0301 03/31/16 0307 04/01/16 0523 04/02/16 1232 04/05/16 0908 04/11/16  WBC 13.4* 10.7* 12.2* 9.9  --  8.6 9.6  NEUTROABS 11.6* 8.9* 10.1*  --   --   --   --   HGB 10.2* 8.9* 8.2* 9.0*  --  8.3* 7.7*  HCT 33.7* 28.8* 27.0* 29.2*  --  27.1* 25*  MCV 91.3 91.1 91.5 91.8  --  93.1  --   PLT 193 154 160 176 195 175 185   No results found for: TSH No results found for: HGBA1C Lab Results  Component Value Date   CHOL 87 03/29/2016   HDL 53 03/29/2016   LDLCALC  27 03/29/2016   TRIG 36 03/29/2016   CHOLHDL 1.6 03/29/2016   Assessment/Plan 1. Hematuria Bilateral Nephrostomy tubes draining clear yellow urine. Suprapubic foley catheter draining blood tinged urine. Obtain Urine specimen from suprapubic Catheter for U/A and C/S rule out UTI. Facility Nurse to change suprapubic Catheter.   2. Iron deficiency anemia Hgb has improved 8.1 ( 04/17/2016) previous 7.7. Will continue on Ferrous sulfate twice daily. Monitor CBC.     Family/ staff Communication:Reviewed plan of care with patient and facility Nurse supervisor.   Labs/tests ordered:  Urine specimen from suprapubic Catheter for U/A and C/S

## 2016-04-24 ENCOUNTER — Non-Acute Institutional Stay (SKILLED_NURSING_FACILITY): Payer: Medicare Other | Admitting: Family

## 2016-04-24 DIAGNOSIS — Z9289 Personal history of other medical treatment: Secondary | ICD-10-CM | POA: Diagnosis not present

## 2016-04-24 DIAGNOSIS — R2681 Unsteadiness on feet: Secondary | ICD-10-CM

## 2016-04-24 DIAGNOSIS — M6281 Muscle weakness (generalized): Secondary | ICD-10-CM | POA: Diagnosis not present

## 2016-04-24 DIAGNOSIS — I129 Hypertensive chronic kidney disease with stage 1 through stage 4 chronic kidney disease, or unspecified chronic kidney disease: Secondary | ICD-10-CM

## 2016-04-24 DIAGNOSIS — N183 Chronic kidney disease, stage 3 unspecified: Secondary | ICD-10-CM

## 2016-04-24 DIAGNOSIS — D649 Anemia, unspecified: Secondary | ICD-10-CM

## 2016-04-24 DIAGNOSIS — Z978 Presence of other specified devices: Secondary | ICD-10-CM

## 2016-04-24 DIAGNOSIS — N1330 Unspecified hydronephrosis: Secondary | ICD-10-CM | POA: Diagnosis not present

## 2016-04-24 DIAGNOSIS — E43 Unspecified severe protein-calorie malnutrition: Secondary | ICD-10-CM

## 2016-04-24 DIAGNOSIS — Z960 Presence of urogenital implants: Secondary | ICD-10-CM

## 2016-04-24 NOTE — Progress Notes (Signed)
Location:  [place] Room Number: 1204  Place of Service:  SNF (31)  Provider:Emelynn Rance FNP-C   PCP: [name], MD Patient Care Team: Darreld Mclean, MD as PCP - General (Family Medicine)  Extended Emergency Contact Information Primary Emergency Contact: [name] [place] of [place] Mobile Phone: [phone] Relation: Spouse Secondary Emergency Contact: Theola Sequin Address: [address]          [place], [place] [address] [name] of [place] Phone: [phone] Mobile Phone: [phone] Relation: Daughter  Code Status: Full Code  Goals of care:  Advanced Directive information Advanced Directives 03/28/2016  Does patient have an advance directive? No  Would patient like information on creating an advanced directive? No - patient declined information     No Known Allergies  Chief Complaint  Patient presents with  . Discharge Note    discharge Home     HPI:  82 y.o. male  Seen today at [facility] for discharge home. He was here for short term rehabilitation post hospital admission from 03/28/16-04/05/16 with generalized weakness and decreased urine output. He had acute on chronic renal failure with obstructive uropathy and hyperkalemia. CT abdomen and pelvis showed bilateral hydroureteronephrosis right > left. He had percutaneous nephrostomy tube place and kayexalate was given. 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 He denies any acute issues. He has worked well with PT/OT now stable for discharge home.He will be discharged home with Home health PT/OT to continue with ROM, Exercise, Gait stability and muscle strengthening. He will also require a [facility] RN for colostomy and foley Catheter management. He does not require DME has own FWW. Home health  services will be arranged by facility social worker prior to discharge.He will be discharge with meds from the facility. Prescription medication will be written x 1 month then patient to follow up with PCP in 1-2 weeks. Facility staff report no new concerns.      Past Medical History:  Diagnosis Date  . Blood in stool   . Blood transfusion without reported diagnosis   . CAD (coronary artery disease)    a. 2000 - Says he had CABG x 3 or 4.  . Chicken pox   . Colon polyp   . Elevated blood pressure reading   . Glaucoma   . Heart murmur   . Hx: UTI (urinary tract infection)   . Hyperlipidemia   . Prostate cancer ([place])   . Valvular heart disease    a. 2005 s/p Valve replacement - presumably bioprosthetic (not on anticoagulation).  He doesn't know which valve.    Past Surgical History:  Procedure Laterality Date  . CORONARY ARTERY BYPASS GRAFT    . IR GENERIC HISTORICAL  04/02/2016   IR NEPHROSTOMY PLACEMENT LEFT 04/02/2016 Sandi Mariscal, MD [facility]  . IR GENERIC HISTORICAL  04/02/2016   IR NEPHROSTOMY PLACEMENT RIGHT 04/02/2016 Sandi Mariscal, MD [facility]  . LUNG SURGERY    . PROSTATE SURGERY        reports that he quit smoking about 44 years ago. He has never used smokeless tobacco. He reports that he does not drink alcohol or use drugs. Social History   Social History  . Marital status: Married    Spouse name: N/A  . Number of children: N/A  . Years of education: N/A  Occupational History  . Not on file.   Social History Main Topics  . Smoking status: Former Smoker    Quit date: 07/24/1971  . Smokeless tobacco: Never Used  . Alcohol use No     Comment: prev drank.  quit 1973 or 1974.  . Drug use: No  . Sexual activity: Not on file   Other Topics Concern  . Not on file   Social History Narrative   Lives in [place], [place] area by himself but is currently staying with his sister in [place] area after prolonged hospitalization this summer.   No Known  Allergies  Pertinent  Health Maintenance Due  Topic Date Due  . PNA vac Low Risk Adult (1 of 2 - PCV13) 06/21/1994  . INFLUENZA VACCINE  10/20/2016 (Originally 02/21/2016)    Medications:   Medication List       Accurate as of 04/24/16  3:28 PM. Always use your most recent med list.          albuterol (2.5 MG/3ML) 0.083% nebulizer solution Commonly known as:  PROVENTIL Take 3 mLs (2.5 mg total) by nebulization every 6 (six) hours as needed for wheezing or shortness of breath.   atorvastatin 20 MG tablet Commonly known as:  LIPITOR Take 20 mg by mouth daily.   cloNIDine HCl 0.1 MG Tb12 ER tablet Commonly known as:  KAPVAY Take 0.1 mg by mouth 2 (two) times daily.   feeding supplement (PRO-STAT SUGAR FREE 64) Liqd Take 30 mLs by mouth daily. Stop date 05/10/16   ferrous sulfate 325 (65 FE) MG tablet Take 325 mg by mouth 2 (two) times daily with a meal.   HYDROcodone-acetaminophen 5-325 MG tablet Commonly known as:  NORCO/VICODIN Take 1 tablet by mouth every 6 (six) hours as needed.   metoprolol tartrate 25 MG tablet Commonly known as:  LOPRESSOR Take 0.5 tablets (12.5 mg total) by mouth 2 (two) times daily.   nitroGLYCERIN 0.4 MG SL tablet Commonly known as:  NITROSTAT Place 0.4 mg under the tongue every 5 (five) minutes as needed for chest pain.   pantoprazole 40 MG tablet Commonly known as:  PROTONIX Take 40 mg by mouth daily.   sodium bicarbonate 650 MG tablet Take 650 mg by mouth 4 (four) times daily.       Review of Systems  Constitutional: Negative for activity change, appetite change, chills, fatigue and fever.  HENT: Negative for congestion, rhinorrhea, sinus pressure, sneezing and sore throat.   Eyes: Negative.   Respiratory: Negative for cough, chest tightness, shortness of breath and wheezing.   Cardiovascular: Negative for chest pain, palpitations and leg swelling.  Gastrointestinal: Negative for abdominal distention, abdominal pain,  constipation, diarrhea, nausea and vomiting.       Right lower quadrant colostomy  Endocrine: Negative for cold intolerance, heat intolerance, polydipsia, polyphagia and polyuria.  Genitourinary: Negative for flank pain, hematuria and urgency.       Bilateral Nephrostomy tubes and Suprapubic Catheter   Musculoskeletal: Positive for gait problem.  Skin: Negative for color change, pallor and rash.  Neurological: Negative for dizziness, seizures, light-headedness and headaches.  Hematological: Does not bruise/bleed easily.  Psychiatric/Behavioral: Negative for agitation, confusion, hallucinations and sleep disturbance. The patient is not nervous/anxious.     Vitals:   04/24/16 1200  BP: (!) 135/57  Pulse: 74  Resp: 18  Temp: 98.9 F (37.2 C)  SpO2: 96%  Weight: 114 lb 3.2 oz (51.8 kg)  Height: 5\' 8"  (1.727 m)   Body mass index  is 17.36 kg/m. Physical Exam  Constitutional: He is oriented to person, place, and time.  Thin, Frail Elderly in no acute distress  HENT:  Head: Normocephalic.  Mouth/Throat: Oropharynx is clear and moist. No oropharyngeal exudate.  Eyes: Conjunctivae and EOM are normal. Pupils are equal, round, and reactive to light. Right eye exhibits no discharge. Left eye exhibits no discharge. No scleral icterus.  Neck: Normal range of motion. No JVD present. No thyromegaly present.  Cardiovascular: Normal rate, regular rhythm, normal heart sounds and intact distal pulses.  Exam reveals no gallop and no friction rub.   No murmur heard. Pulmonary/Chest: Effort normal and breath sounds normal. No respiratory distress. He has no wheezes. He has no rales.  Abdominal: Soft. Bowel sounds are normal. He exhibits no distension. There is no tenderness. There is no rebound and no guarding.  RLQ colostomy draining adequate amount of stool   Genitourinary:  Genitourinary Comments: Bilateral Nephrostomy and Suprapubic Catheter draining adequate amounts of yellow clear urine.   Musculoskeletal: He exhibits no edema, tenderness or deformity.  Lymphadenopathy:    He has no cervical adenopathy.  Neurological: He is oriented to person, place, and time.  Skin: Skin is warm and dry. No rash noted. No erythema. No pallor.  Psychiatric: He has a normal mood and affect.    Labs reviewed: Basic Metabolic Panel:  Recent Labs  03/29/16 1113  03/30/16 0301 03/31/16 0307  04/03/16 0410 04/04/16 0759 04/05/16 0908  NA 144  < > 135 137  < > 139 141 144  K 5.9*  < > 5.2* 5.4*  < > 5.6* 5.5* 4.3  CL 112*  < > 104 107  < > 114* 120* 122*  CO2 22  < > 22 22  < > 17* 14* 15*  GLUCOSE 116*  < > 146* 108*  < > 93 76 173*  BUN 68*  < > 60* 66*  < > 61* 48* 36*  CREATININE 3.42*  < > 3.28* 3.39*  < > 3.20* 2.60* 2.20*  CALCIUM 9.7  < > 8.3* 8.2*  < > 8.8* 8.5* 8.6*  MG 1.5*  --  1.7 1.7  --   --   --   --   PHOS  --   --   --  5.0*  < > 4.9* 3.8 3.2  < > = values in this interval not displayed. Liver Function Tests:  Recent Labs  03/28/16 1007 03/30/16 0301  04/03/16 0410 04/04/16 0759 04/05/16 0908  AST 38 36  --   --   --  66*  ALT 62 53  --   --   --  75*  ALKPHOS 61 48  --   --   --  49  BILITOT 0.9 0.3  --   --   --  0.3  PROT 7.2 5.8*  --   --   --  5.8*  ALBUMIN 3.1* 2.3*  < > 2.3* 2.4* 2.4*  < > = values in this interval not displayed.  CBC:  Recent Labs  03/29/16 1113 03/30/16 0301 03/31/16 0307 04/01/16 0523  04/05/16 0908 04/11/16 04/17/16  WBC 13.4* 10.7* 12.2* 9.9  --  8.6 9.6 9.1  NEUTROABS 11.6* 8.9* 10.1*  --   --   --   --   --   HGB 10.2* 8.9* 8.2* 9.0*  --  8.3* 7.7* 8.1*  HCT 33.7* 28.8* 27.0* 29.2*  --  27.1* 25* 27*  MCV 91.3 91.1 91.5 91.8  --  93.1  --   --   PLT 193 154 160 176  < > 175 185 195  < > = values in this interval not displayed. Cardiac Enzymes:  Recent Labs  03/29/16 0212 03/30/16 0301 03/30/16 0857  TROPONINI 0.04* 0.03* 0.03*   Assessment/Plan:   1. Benign hypertension with CKD (chronic kidney disease)  stage III B/p stable. Continue on Metoprolol. BMP in 1-2 weeks with PCP.   2. Anemia, unspecified type Hgb 8.1 ( 04/17/2016) previous Hgb 7.1. CBC in 1-2 weeks with PCP    3. Protein-calorie malnutrition Continue on protein supplement  4. Stage 3 chronic kidney disease CR at baseline.Follow up with Nephrology.   5. Hydronephrosis, bilateral Follow up with Nephrology  6. Generalized muscle weakness Has improved with physical therapy.will discharge home with PT/OT to continues muscle strengthening.    7. Unsteady gait Has improved. Will discharge home with Home health PT/OT to continue with ROM, Exercise and Gait stability.  8. Chronic indwelling Foley catheter Draining adequate amounts of urine. Post hospital admission per HPI. Will discharge home with [facility] RN for foley catheter management.   Patient is being discharged with the following home health services:   - will be discharged home with Home health PT/OT to continue with ROM, Exercise, Gait stability and muscle strengthening.  -[facility] RN for colostomy and foley Catheter management.  Patient is being discharged with the following durable medical equipment:    - He does not require  DME has own FWW.  Patient has been advised to f/u with their PCP in 1-2 weeks to for a transitions of care visit.  Social services at their facility was responsible for arranging this appointment.  Pt was provided with adequate prescriptions of noncontrolled medications to reach the scheduled appointment .  For controlled substances, a limited supply was provided as appropriate for the individual patient.  If the pt normally receives these medications from a pain clinic or has a contract with another physician, these medications should be received from that clinic or physician only).    Future labs/tests needed:  CBC and BMP in 1-2 weeks with PCP

## 2016-05-03 ENCOUNTER — Telehealth: Payer: Self-pay | Admitting: Family Medicine

## 2016-05-03 NOTE — Telephone Encounter (Signed)
Called Will from [facility] OT. No answer, left message giving permission per Dr. Lorelei Pont to approve verbal orders.

## 2016-05-03 NOTE — Telephone Encounter (Signed)
Caller name: Will  Relation to pt: OT from [facility]  Call back number: [phone]    Reason for call:  Verbal orders 2x 4 OT  & shower chair

## 2016-05-07 ENCOUNTER — Emergency Department (HOSPITAL_COMMUNITY): Payer: Medicare Other

## 2016-05-07 ENCOUNTER — Encounter (HOSPITAL_COMMUNITY): Payer: Self-pay

## 2016-05-07 ENCOUNTER — Telehealth: Payer: Self-pay | Admitting: *Deleted

## 2016-05-07 ENCOUNTER — Encounter: Payer: Self-pay | Admitting: Family Medicine

## 2016-05-07 ENCOUNTER — Ambulatory Visit (INDEPENDENT_AMBULATORY_CARE_PROVIDER_SITE_OTHER): Payer: 59 | Admitting: Family Medicine

## 2016-05-07 ENCOUNTER — Inpatient Hospital Stay (HOSPITAL_COMMUNITY)
Admission: EM | Admit: 2016-05-07 | Discharge: 2016-05-15 | DRG: 871 | Disposition: A | Discharge status: Skilled Nursing Facility | Payer: Medicare Other | Attending: Internal Medicine | Admitting: Internal Medicine

## 2016-05-07 VITALS — BP 118/76 | HR 96 | Temp 97.5°F | Wt 107.8 lb

## 2016-05-07 DIAGNOSIS — R109 Unspecified abdominal pain: Secondary | ICD-10-CM

## 2016-05-07 DIAGNOSIS — Z933 Colostomy status: Secondary | ICD-10-CM

## 2016-05-07 DIAGNOSIS — T83092A Other mechanical complication of nephrostomy catheter, initial encounter: Secondary | ICD-10-CM

## 2016-05-07 DIAGNOSIS — N1330 Unspecified hydronephrosis: Secondary | ICD-10-CM | POA: Diagnosis present

## 2016-05-07 DIAGNOSIS — N390 Urinary tract infection, site not specified: Secondary | ICD-10-CM | POA: Diagnosis present

## 2016-05-07 DIAGNOSIS — Z936 Other artificial openings of urinary tract status: Secondary | ICD-10-CM

## 2016-05-07 DIAGNOSIS — I471 Supraventricular tachycardia: Secondary | ICD-10-CM | POA: Diagnosis present

## 2016-05-07 DIAGNOSIS — R001 Bradycardia, unspecified: Secondary | ICD-10-CM | POA: Diagnosis present

## 2016-05-07 DIAGNOSIS — D72829 Elevated white blood cell count, unspecified: Secondary | ICD-10-CM

## 2016-05-07 DIAGNOSIS — E785 Hyperlipidemia, unspecified: Secondary | ICD-10-CM | POA: Diagnosis present

## 2016-05-07 DIAGNOSIS — N189 Chronic kidney disease, unspecified: Secondary | ICD-10-CM | POA: Diagnosis not present

## 2016-05-07 DIAGNOSIS — Z681 Body mass index (BMI) 19 or less, adult: Secondary | ICD-10-CM

## 2016-05-07 DIAGNOSIS — R1013 Epigastric pain: Secondary | ICD-10-CM

## 2016-05-07 DIAGNOSIS — I959 Hypotension, unspecified: Secondary | ICD-10-CM | POA: Diagnosis present

## 2016-05-07 DIAGNOSIS — D638 Anemia in other chronic diseases classified elsewhere: Secondary | ICD-10-CM | POA: Diagnosis present

## 2016-05-07 DIAGNOSIS — R5381 Other malaise: Secondary | ICD-10-CM | POA: Diagnosis not present

## 2016-05-07 DIAGNOSIS — A419 Sepsis, unspecified organism: Secondary | ICD-10-CM | POA: Diagnosis not present

## 2016-05-07 DIAGNOSIS — G8929 Other chronic pain: Secondary | ICD-10-CM

## 2016-05-07 DIAGNOSIS — R911 Solitary pulmonary nodule: Secondary | ICD-10-CM | POA: Diagnosis present

## 2016-05-07 DIAGNOSIS — Z87891 Personal history of nicotine dependence: Secondary | ICD-10-CM

## 2016-05-07 DIAGNOSIS — B3749 Other urogenital candidiasis: Secondary | ICD-10-CM | POA: Diagnosis present

## 2016-05-07 DIAGNOSIS — C61 Malignant neoplasm of prostate: Secondary | ICD-10-CM | POA: Diagnosis present

## 2016-05-07 DIAGNOSIS — Z7982 Long term (current) use of aspirin: Secondary | ICD-10-CM

## 2016-05-07 DIAGNOSIS — Z8546 Personal history of malignant neoplasm of prostate: Secondary | ICD-10-CM

## 2016-05-07 DIAGNOSIS — D649 Anemia, unspecified: Secondary | ICD-10-CM | POA: Diagnosis present

## 2016-05-07 DIAGNOSIS — R5383 Other fatigue: Secondary | ICD-10-CM | POA: Diagnosis not present

## 2016-05-07 DIAGNOSIS — L988 Other specified disorders of the skin and subcutaneous tissue: Secondary | ICD-10-CM

## 2016-05-07 DIAGNOSIS — F329 Major depressive disorder, single episode, unspecified: Secondary | ICD-10-CM | POA: Diagnosis not present

## 2016-05-07 DIAGNOSIS — E43 Unspecified severe protein-calorie malnutrition: Secondary | ICD-10-CM | POA: Diagnosis present

## 2016-05-07 DIAGNOSIS — E872 Acidosis: Secondary | ICD-10-CM | POA: Diagnosis present

## 2016-05-07 DIAGNOSIS — R627 Adult failure to thrive: Secondary | ICD-10-CM | POA: Diagnosis present

## 2016-05-07 DIAGNOSIS — E875 Hyperkalemia: Secondary | ICD-10-CM | POA: Diagnosis present

## 2016-05-07 DIAGNOSIS — T83512A Infection and inflammatory reaction due to nephrostomy catheter, initial encounter: Secondary | ICD-10-CM

## 2016-05-07 DIAGNOSIS — N3001 Acute cystitis with hematuria: Secondary | ICD-10-CM

## 2016-05-07 DIAGNOSIS — N179 Acute kidney failure, unspecified: Secondary | ICD-10-CM | POA: Diagnosis present

## 2016-05-07 DIAGNOSIS — Z66 Do not resuscitate: Secondary | ICD-10-CM | POA: Diagnosis not present

## 2016-05-07 DIAGNOSIS — Z79899 Other long term (current) drug therapy: Secondary | ICD-10-CM

## 2016-05-07 DIAGNOSIS — Z23 Encounter for immunization: Secondary | ICD-10-CM

## 2016-05-07 DIAGNOSIS — E860 Dehydration: Secondary | ICD-10-CM | POA: Diagnosis present

## 2016-05-07 DIAGNOSIS — I2510 Atherosclerotic heart disease of native coronary artery without angina pectoris: Secondary | ICD-10-CM | POA: Diagnosis present

## 2016-05-07 DIAGNOSIS — B9620 Unspecified Escherichia coli [E. coli] as the cause of diseases classified elsewhere: Secondary | ICD-10-CM | POA: Diagnosis present

## 2016-05-07 DIAGNOSIS — R0902 Hypoxemia: Secondary | ICD-10-CM | POA: Diagnosis present

## 2016-05-07 DIAGNOSIS — I129 Hypertensive chronic kidney disease with stage 1 through stage 4 chronic kidney disease, or unspecified chronic kidney disease: Secondary | ICD-10-CM | POA: Diagnosis present

## 2016-05-07 DIAGNOSIS — Z953 Presence of xenogenic heart valve: Secondary | ICD-10-CM

## 2016-05-07 DIAGNOSIS — Z923 Personal history of irradiation: Secondary | ICD-10-CM

## 2016-05-07 DIAGNOSIS — Z9889 Other specified postprocedural states: Secondary | ICD-10-CM

## 2016-05-07 DIAGNOSIS — Z951 Presence of aortocoronary bypass graft: Secondary | ICD-10-CM

## 2016-05-07 LAB — CBC WITH DIFFERENTIAL/PLATELET
Basophils Absolute: 0 10*3/uL (ref 0.0–0.1)
Basophils Relative: 0 %
Eosinophils Absolute: 0.1 10*3/uL (ref 0.0–0.7)
Eosinophils Relative: 0 %
HCT: 28.4 % — ABNORMAL LOW (ref 39.0–52.0)
Hemoglobin: 9.3 g/dL — ABNORMAL LOW (ref 13.0–17.0)
Lymphocytes Relative: 4 %
Lymphs Abs: 0.9 10*3/uL (ref 0.7–4.0)
MCH: 29 pg (ref 26.0–34.0)
MCHC: 32.7 g/dL (ref 30.0–36.0)
MCV: 88.5 fL (ref 78.0–100.0)
Monocytes Absolute: 1.2 10*3/uL — ABNORMAL HIGH (ref 0.1–1.0)
Monocytes Relative: 5 %
Neutro Abs: 21.7 10*3/uL — ABNORMAL HIGH (ref 1.7–7.7)
Neutrophils Relative %: 91 %
Platelets: 322 10*3/uL (ref 150–400)
RBC: 3.21 MIL/uL — ABNORMAL LOW (ref 4.22–5.81)
RDW: 16.2 % — ABNORMAL HIGH (ref 11.5–15.5)
WBC: 23.9 10*3/uL — ABNORMAL HIGH (ref 4.0–10.5)

## 2016-05-07 LAB — COMPREHENSIVE METABOLIC PANEL
ALBUMIN: 3 g/dL — AB (ref 3.5–5.2)
ALK PHOS: 122 U/L — AB (ref 39–117)
ALT: 71 U/L — ABNORMAL HIGH (ref 0–53)
ALT: 75 U/L — ABNORMAL HIGH (ref 17–63)
AST: 76 U/L — ABNORMAL HIGH (ref 0–37)
AST: 73 U/L — ABNORMAL HIGH (ref 15–41)
Albumin: 2.7 g/dL — ABNORMAL LOW (ref 3.5–5.0)
Alkaline Phosphatase: 125 U/L (ref 38–126)
Anion gap: 10 (ref 5–15)
BUN: 63 mg/dL — AB (ref 6–23)
BUN: 64 mg/dL — ABNORMAL HIGH (ref 6–20)
CHLORIDE: 101 meq/L (ref 96–112)
CO2: 17 mEq/L — ABNORMAL LOW (ref 19–32)
CO2: 20 mmol/L — ABNORMAL LOW (ref 22–32)
Calcium: 8.9 mg/dL (ref 8.4–10.5)
Calcium: 9.1 mg/dL (ref 8.9–10.3)
Chloride: 103 mmol/L (ref 101–111)
Creatinine, Ser: 3.19 mg/dL — ABNORMAL HIGH (ref 0.40–1.50)
Creatinine, Ser: 3.13 mg/dL — ABNORMAL HIGH (ref 0.61–1.24)
GFR calc Af Amer: 19 mL/min — ABNORMAL LOW (ref >60)
GFR calc non Af Amer: 17 mL/min — ABNORMAL LOW (ref >60)
GFR: 19.7 mL/min — AB (ref >60.00)
GLUCOSE: 145 mg/dL — AB (ref 70–99)
Glucose, Bld: 126 mg/dL — ABNORMAL HIGH (ref 65–99)
POTASSIUM: 5.3 meq/L — AB (ref 3.5–5.1)
Potassium: 5.3 mmol/L — ABNORMAL HIGH (ref 3.5–5.1)
SODIUM: 133 meq/L — AB (ref 135–145)
Sodium: 133 mmol/L — ABNORMAL LOW (ref 135–145)
TOTAL PROTEIN: 6.7 g/dL (ref 6.0–8.3)
Total Bilirubin: 0.5 mg/dL (ref 0.2–1.2)
Total Bilirubin: 0.7 mg/dL (ref 0.3–1.2)
Total Protein: 7.3 g/dL (ref 6.5–8.1)

## 2016-05-07 LAB — URINALYSIS, ROUTINE W REFLEX MICROSCOPIC
Bilirubin Urine: NEGATIVE
Glucose, UA: NEGATIVE mg/dL
Ketones, ur: NEGATIVE mg/dL
Nitrite: NEGATIVE
Protein, ur: 100 mg/dL — AB
Specific Gravity, Urine: 1.015 (ref 1.005–1.030)
pH: 5.5 (ref 5.0–8.0)

## 2016-05-07 LAB — URINE MICROSCOPIC-ADD ON

## 2016-05-07 LAB — CBC
HEMATOCRIT: 31.1 % — AB (ref 39.0–52.0)
HEMOGLOBIN: 10.2 g/dL — AB (ref 13.0–17.0)
MCHC: 32.7 g/dL (ref 30.0–36.0)
MCV: 89.3 fl (ref 78.0–100.0)
Platelets: 251 10*3/uL (ref 150.0–400.0)
RBC: 3.49 Mil/uL — ABNORMAL LOW (ref 4.22–5.81)
RDW: 18.7 % — AB (ref 11.5–15.5)

## 2016-05-07 LAB — PSA: PSA: 0 ng/mL — AB (ref 0.10–4.00)

## 2016-05-07 LAB — LIPASE, BLOOD: Lipase: 27 U/L (ref 11–51)

## 2016-05-07 MED ORDER — HYDROCODONE-ACETAMINOPHEN 5-325 MG PO TABS: 1.0000 | ORAL_TABLET | Freq: Four times a day (QID) | ORAL | 0 refills | Status: DC | PRN

## 2016-05-07 MED ORDER — DEXTROSE 5 % IV SOLN
1.0000 g | Freq: Once | INTRAVENOUS | Status: AC
  Administered 2016-05-07: 1 g via INTRAVENOUS
  Filled 2016-05-07: qty 10

## 2016-05-07 NOTE — ED Provider Notes (Signed)
[facility] DEPT Provider Note   CSN: [id number] Arrival date & time: 05/07/16  1701     History   Chief Complaint Chief Complaint  Patient presents with  . Abnormal Lab    HPI Joseph Carrillo is a 82 y.o. male.  HPI   82 year old male presents today with leukocytosis. Patient was seen by his primary care provider today for routine evaluation and routine lab draw. Patient was noted to have WBC of 23.9 and was instructed, to emergency room. Patient's daughter is at bedside and reports that he has been fatigued over the last week, has had an upper congestion. They note that patient also had an episode of abdominal pain yesterday has resolved today. She reports this is not abnormal for the patient. She reports he appears dehydrated.  She reports that patient just got out of rehabilitation facility approximately one week ago, after discharge from rehabilitation patient has become more weak. She reports she has recurrent urinary tract infections.  Past Medical History:  Diagnosis Date  . Blood in stool   . Blood transfusion without reported diagnosis   . CAD (coronary artery disease)    a. 2000 - Says he had CABG x 3 or 4.  . Chicken pox   . Colon polyp   . Elevated blood pressure reading   . Glaucoma   . Heart murmur   . Hx: UTI (urinary tract infection)   . Hyperlipidemia   . Prostate cancer ([place])   . Valvular heart disease    a. 2005 s/p Valve replacement - presumably bioprosthetic (not on anticoagulation).  He doesn't know which valve.    Patient Active Problem List   Diagnosis Date Noted  . Hydronephrosis, bilateral   . Protein-calorie malnutrition, severe 03/30/2016  . Renal failure (ARF), acute on chronic (HCC)   . Hyperkalemia 03/28/2016  . CKD (chronic kidney disease) 03/28/2016  . Suprapubic catheter ([place]) 03/28/2016  . UTI (urinary tract infection) 03/28/2016  . Leukocytosis 03/28/2016  . Anemia 03/28/2016  . History of urostomy 03/22/2016  . Colostomy in  place [facility]) 03/22/2016  . Underweight 03/22/2016  . Prostate cancer ([place]) 03/22/2016  . Abdominal pain, chronic, epigastric 03/22/2016  . Physical debility 03/22/2016    Past Surgical History:  Procedure Laterality Date  . CORONARY ARTERY BYPASS GRAFT    . IR GENERIC HISTORICAL  04/02/2016   IR NEPHROSTOMY PLACEMENT LEFT 04/02/2016 Sandi Mariscal, MD [facility]  . IR GENERIC HISTORICAL  04/02/2016   IR NEPHROSTOMY PLACEMENT RIGHT 04/02/2016 Sandi Mariscal, MD [facility]  . LUNG SURGERY    . PROSTATE SURGERY         Home Medications    Prior to Admission medications   Medication Sig Start Date End Date Taking? Authorizing Provider  albuterol (PROVENTIL) (2.5 MG/3ML) 0.083% nebulizer solution Take 3 mLs (2.5 mg total) by nebulization every 6 (six) hours as needed for wheezing or shortness of breath. 04/05/16  Yes Reyne Dumas, MD  aspirin EC 81 MG tablet Take 81 mg by mouth daily at 12 noon.   Yes Historical Provider, MD  atorvastatin (LIPITOR) 20 MG tablet Take 20 mg by mouth every morning.    Yes Historical Provider, MD  cloNIDine HCl (KAPVAY) 0.1 MG TB12 ER tablet Take 0.1 mg by mouth 2 (two) times daily.   Yes Historical Provider, MD  HYDROcodone-acetaminophen (NORCO/VICODIN) 5-325 MG tablet Take 1 tablet by mouth every 6 (six) hours as needed. Patient taking differently: Take 1 tablet by mouth every 6 (six) hours  as needed for moderate pain or severe pain.  05/07/16  Yes Gay Filler Copland, MD  metoprolol tartrate (LOPRESSOR) 25 MG tablet Take 0.5 tablets (12.5 mg total) by mouth 2 (two) times daily. 04/05/16  Yes Reyne Dumas, MD  nitroGLYCERIN (NITROSTAT) 0.4 MG SL tablet Place 0.4 mg under the tongue every 5 (five) minutes as needed for chest pain.   Yes Historical Provider, MD  pantoprazole (PROTONIX) 40 MG tablet Take 40 mg by mouth every morning.    Yes Historical Provider, MD  sodium bicarbonate 650 MG tablet Take 650 mg by mouth 4 (four) times daily.   Yes Historical Provider, MD      Family History Family History  Problem Relation Age of Onset  . Heart disease Father     Pt says his brother had heart infection and died suddenly.    Social History Social History  Substance Use Topics  . Smoking status: Former Smoker    Quit date: 07/24/1971  . Smokeless tobacco: Never Used  . Alcohol use No     Comment: prev drank.  quit 1973 or 1974.     Allergies   Review of patient's allergies indicates no known allergies.   Review of Systems Review of Systems  All other systems reviewed and are negative.   Physical Exam Updated Vital Signs BP (!) 118/48 (BP Location: Left Arm)   Pulse (!) 48   Temp 97.6 F (36.4 C) (Oral)   Resp 17   Ht 5\' 8"  (1.727 m)   Wt 49 kg   SpO2 97%   BMI 16.42 kg/m   Physical Exam  Constitutional: He is oriented to person, place, and time. He appears well-developed and well-nourished.  HENT:  Head: Normocephalic and atraumatic.  Eyes: Conjunctivae are normal. Pupils are equal, round, and reactive to light. Right eye exhibits no discharge. Left eye exhibits no discharge. No scleral icterus.  Neck: Normal range of motion. No JVD present. No tracheal deviation present.  Pulmonary/Chest: Effort normal. No stridor.  Abdominal:  Colostomy with no signs of surrounding infection   Neurological: He is alert and oriented to person, place, and time. Coordination normal.  Psychiatric: He has a normal mood and affect. His behavior is normal. Judgment and thought content normal.  Nursing note and vitals reviewed.   ED Treatments / Results  Labs (all labs ordered are listed, but only abnormal results are displayed) Labs Reviewed  URINALYSIS, ROUTINE W REFLEX MICROSCOPIC (NOT AT [facility]) - Abnormal; Notable for the following:       Result Value   APPearance TURBID (*)    Hgb urine dipstick LARGE (*)    Protein, ur 100 (*)    Leukocytes, UA LARGE (*)    All other components within normal limits  CBC WITH DIFFERENTIAL/PLATELET -  Abnormal; Notable for the following:    WBC 23.9 (*)    RBC 3.21 (*)    Hemoglobin 9.3 (*)    HCT 28.4 (*)    RDW 16.2 (*)    Neutro Abs 21.7 (*)    Monocytes Absolute 1.2 (*)    All other components within normal limits  COMPREHENSIVE METABOLIC PANEL - Abnormal; Notable for the following:    Sodium 133 (*)    Potassium 5.3 (*)    CO2 20 (*)    Glucose, Bld 126 (*)    BUN 64 (*)    Creatinine, Ser 3.13 (*)    Albumin 2.7 (*)    AST 73 (*)  ALT 75 (*)    GFR calc non Af Amer 17 (*)    GFR calc Af Amer 19 (*)    All other components within normal limits  URINE MICROSCOPIC-ADD ON - Abnormal; Notable for the following:    Squamous Epithelial / LPF 0-5 (*)    Bacteria, UA MANY (*)    Casts HYALINE CASTS (*)    All other components within normal limits  CULTURE, BLOOD (ROUTINE X 2)  CULTURE, BLOOD (ROUTINE X 2)  LIPASE, BLOOD    EKG  EKG Interpretation None       Radiology No results found.  Procedures Procedures (including critical care time)  Medications Ordered in ED Medications  cefTRIAXone (ROCEPHIN) 1 g in dextrose 5 % 50 mL IVPB (1 g Intravenous New Bag/Given 05/07/16 2126)     Initial Impression / Assessment and Plan / ED Course  I have reviewed the triage vital signs and the nursing notes.  Pertinent labs & imaging results that were available during my care of the patient were reviewed by me and considered in my medical decision making (see chart for details).  Clinical Course     Final Clinical Impressions(s) / ED Diagnoses   Final diagnoses:  Acute cystitis with hematuria    Labs:  CBC, CMP, lipase, urinalysis- WBC 23.9  Imaging:  Consults:  Therapeutics:  Discharge Meds:   Assessment/Plan:   82 year old male presents today with weakness and leukocytosis. Patient has history of recurrent UTIs, was also recently discharged from nursing facility. Patient's urine consistent with UTI, with upper respiratory congestion chest x-ray  needed to rule out pneumonia. Patient will likely need hospital admission for IV antibiotics. Rocephin ordered pending chest x-ray.  New Prescriptions New Prescriptions   No medications on file     Okey Regal, PA-C 05/07/16 2154    Sherwood Gambler, MD 05/08/16 0002

## 2016-05-07 NOTE — Progress Notes (Signed)
[name] [address] [place], [place] [address] [phone]  Date:  05/07/2016   Name:  Joseph Carrillo   DOB:  07-15-1927   MRN:  [id number]  PCP:  [name], MD    Chief Complaint: Hospitalization Follow-up (Patient has been having some back pain.)   History of Present Illness:  Joseph Carrillo is a 82 y.o. very pleasant male patient who presents with the following:  He was hospitalized from 9/6 to 9/14, and then was in rehab until 10 days ago. He is currently staying with his daughter [name]- she is thinking about placing him in a nursing facility.  His care is quite involved for a layperson and she is feeling overwhelmed.  He was in [facility] for rehab but does not want to go back there again; I encouraged Debbie to research SNF facilities for him  He has a hard time eating, he does not have much appetite He does not have any particular pains or fever, but his daughter notes that he just seems to be sleepy and subdued all the time  He has a urology appt tomorrow- this is good news.  He did not have nephrology follow-up however. At this point will plan to refer him to nephrology as well Will do labs for him today and will also check a PSA for surveillance of his prostate cancer  Wt Readings from Last 3 Encounters:  05/07/16 107 lb 12.8 oz (48.9 kg)  04/24/16 114 lb 3.2 oz (51.8 kg)  04/17/16 113 lb (51.3 kg)   He has lost more weight since his hospital stay, his BMI is now under 17    Patient Active Problem List   Diagnosis Date Noted  . Hydronephrosis, bilateral   . Protein-calorie malnutrition, severe 03/30/2016  . Renal failure (ARF), acute on chronic (HCC)   . Hyperkalemia 03/28/2016  . CKD (chronic kidney disease) 03/28/2016  . Suprapubic catheter ([place]) 03/28/2016  . UTI (urinary tract infection) 03/28/2016  . Leukocytosis 03/28/2016  . Anemia 03/28/2016  . History of urostomy 03/22/2016  .  Colostomy in place [facility]) 03/22/2016  . Underweight 03/22/2016  . Prostate cancer ([place]) 03/22/2016  . Abdominal pain, chronic, epigastric 03/22/2016  . Physical debility 03/22/2016    Past Medical History:  Diagnosis Date  . Blood in stool   . Blood transfusion without reported diagnosis   . CAD (coronary artery disease)    a. 2000 - Says he had CABG x 3 or 4.  . Chicken pox   . Colon polyp   . Elevated blood pressure reading   . Glaucoma   . Heart murmur   . Hx: UTI (urinary tract infection)   . Hyperlipidemia   . Prostate cancer ([place])   . Valvular heart disease    a. 2005 s/p Valve replacement - presumably bioprosthetic (not on anticoagulation).  He doesn't know which valve.    Past Surgical History:  Procedure Laterality Date  . CORONARY ARTERY BYPASS GRAFT    . IR GENERIC HISTORICAL  04/02/2016   IR NEPHROSTOMY PLACEMENT LEFT 04/02/2016 Sandi Mariscal, MD [facility]  . IR GENERIC HISTORICAL  04/02/2016   IR NEPHROSTOMY PLACEMENT RIGHT 04/02/2016 Sandi Mariscal, MD [facility]  . LUNG SURGERY    . PROSTATE SURGERY      Social History  Substance Use Topics  . Smoking status: Former Smoker    Quit date: 07/24/1971  . Smokeless tobacco: Never Used  .  Alcohol use No     Comment: prev drank.  quit 1973 or 1974.    Family History  Problem Relation Age of Onset  . Heart disease Father     Pt says his brother had heart infection and died suddenly.    No Known Allergies  Medication list has been reviewed and updated.  Current Outpatient Prescriptions on File Prior to Visit  Medication Sig Dispense Refill  . albuterol (PROVENTIL) (2.5 MG/3ML) 0.083% nebulizer solution Take 3 mLs (2.5 mg total) by nebulization every 6 (six) hours as needed for wheezing or shortness of breath. 75 mL 12  . Amino Acids-Protein Hydrolys (FEEDING SUPPLEMENT, PRO-STAT SUGAR FREE 64,) LIQD Take 30 mLs by mouth daily. Stop date 05/10/16    . atorvastatin (LIPITOR) 20 MG tablet Take 20 mg by mouth  daily.    . cloNIDine HCl (KAPVAY) 0.1 MG TB12 ER tablet Take 0.1 mg by mouth 2 (two) times daily.    . ferrous sulfate 325 (65 FE) MG tablet Take 325 mg by mouth 2 (two) times daily with a meal.    . HYDROcodone-acetaminophen (NORCO/VICODIN) 5-325 MG tablet Take 1 tablet by mouth every 6 (six) hours as needed. 30 tablet 0  . metoprolol tartrate (LOPRESSOR) 25 MG tablet Take 0.5 tablets (12.5 mg total) by mouth 2 (two) times daily. 60 tablet 0  . nitroGLYCERIN (NITROSTAT) 0.4 MG SL tablet Place 0.4 mg under the tongue every 5 (five) minutes as needed for chest pain.    . pantoprazole (PROTONIX) 40 MG tablet Take 40 mg by mouth daily.    . sodium bicarbonate 650 MG tablet Take 650 mg by mouth 4 (four) times daily.     No current facility-administered medications on file prior to visit.     Review of Systems:  As per HPI- otherwise negative.  He does not say much, will give a 1-2 word answer to most questions. Continues to smell strongly of urine.  His daughter notes that he has not raised any particular complaints.    Pulse Readings from Last 3 Encounters:  05/07/16 (!) 130  04/24/16 74  04/17/16 62    Physical Examination: Vitals:   05/07/16 1145  BP: 118/76  Pulse: (!) 130  Temp: 97.5 F (36.4 C)   Vitals:   05/07/16 1145  Weight: 107 lb 12.8 oz (48.9 kg)   Body mass index is 16.39 kg/m. Ideal Body Weight:    GEN: very thin, appears younger than age. NAD, Non-toxic, seems alert but does not speak much HEENT: Atraumatic, Normocephalic. Neck supple. No masses, No LAD. Ears and Nose: No external deformity. CV: RRR, No M/G/R. No JVD. No thrill. No extra heart sounds. PULM: CTA B, no wheezes, crackles, rhonchi. No retractions. No resp. distress. No accessory muscle use. ABD: S, NT, ND Wearing a urostomy and colostomy bag.   EXTR: No c/c/e NEURO Normal gait.  PSYCH: Normally interactive. Conversant. Not depressed or anxious appearing.  Calm demeanor.  The pulse is regular  and not tachycardic at recheck He does not appear to have any sacral ducub at this time but I fear he is in danger of skin breakdown  Results for orders placed or performed in visit on 05/07/16  CBC  Result Value Ref Range   WBC 22.4 Repeated and verified X2. (HH) 4.0 - 10.5 K/uL   RBC 3.49 (L) 4.22 - 5.81 Mil/uL   Platelets 251.0 150.0 - 400.0 K/uL   Hemoglobin 10.2 (L) 13.0 - 17.0 g/dL  HCT 31.1 (L) 39.0 - 52.0 %   MCV 89.3 78.0 - 100.0 fl   MCHC 32.7 30.0 - 36.0 g/dL   RDW 18.7 (H) 11.5 - 15.5 %  Comprehensive metabolic panel  Result Value Ref Range   Sodium 133 (L) 135 - 145 mEq/L   Potassium 5.3 (H) 3.5 - 5.1 mEq/L   Chloride 101 96 - 112 mEq/L   CO2 17 (L) 19 - 32 mEq/L   Glucose, Bld 145 (H) 70 - 99 mg/dL   BUN 63 (H) 6 - 23 mg/dL   Creatinine, Ser 3.19 (H) 0.40 - 1.50 mg/dL   Total Bilirubin 0.5 0.2 - 1.2 mg/dL   Alkaline Phosphatase 122 (H) 39 - 117 U/L   AST 76 (H) 0 - 37 U/L   ALT 71 (H) 0 - 53 U/L   Total Protein 6.7 6.0 - 8.3 g/dL   Albumin 3.0 (L) 3.5 - 5.2 g/dL   Calcium 8.9 8.4 - 10.5 mg/dL   GFR 19.70 (L) >60.00 mL/min  PSA  Result Value Ref Range   PSA 0.00 (L) 0.10 - 4.00 ng/mL   Received abnormal white blood cell value as above- called his wife mary who is in [place] and got cell number for his daughter.  Called his daughter [name] and alerted her- she will take him to the [facility] ER as soon as she finishes work in about 3 hours.  He likely has a bacterial infection of some type  Assessment and Plan: Chronic kidney disease, unspecified CKD stage - Plan: CBC, Comprehensive metabolic panel, Ambulatory referral to Nephrology  Colostomy in place [facility])  Prostate cancer ([place]) - Plan: PSA  Physical debility  Abdominal pain, chronic, epigastric - Plan: HYDROcodone-acetaminophen (NORCO/VICODIN) 5-325 MG tablet  Leukocytosis, unspecified type  Here today for a recheck visit after rehab stay.  Will refer to nephrology. He is to see urology tomorrow. However  then received very high wbc count as above. Will refer to ED for evaluation of likely bacterial infection- ?UTI in this fragile elderly person   Signed [name], MD

## 2016-05-07 NOTE — ED Triage Notes (Signed)
Pt sent in by [facility] after being found to have a high WBC count today. Potassium is also elevated. Pt has urostomy tubes and a colonostomy as well. Abdominal pain. Alert and oriented.

## 2016-05-07 NOTE — Patient Instructions (Signed)
It was good to see you again today We will make a referral for you to see a nephrologist (a kidney doctor as opposed to a urologist) to help [place] with your kidney problems I think placement in a skilled nursing facility is a good idea- I am glad to help with this if you need me to complete any forms

## 2016-05-07 NOTE — Telephone Encounter (Signed)
Daughter called to inform provider that she is taking patient to [facility] now.

## 2016-05-07 NOTE — ED Notes (Signed)
2 IV start attempts by RN 

## 2016-05-07 NOTE — Progress Notes (Signed)
Pre visit review using our clinic review tool, if applicable. No additional management support is needed unless otherwise documented below in the visit note. 

## 2016-05-07 NOTE — ED Notes (Signed)
Pt colostomy bag removed and completely replaced. Pt tolerated procedure well.

## 2016-05-07 NOTE — ED Notes (Addendum)
Unable to obtain rectal temperature due to surgical closure of rectum. MD made aware.

## 2016-05-08 ENCOUNTER — Encounter (HOSPITAL_COMMUNITY): Payer: Self-pay | Admitting: Internal Medicine

## 2016-05-08 ENCOUNTER — Inpatient Hospital Stay (HOSPITAL_COMMUNITY): Payer: Medicare Other

## 2016-05-08 DIAGNOSIS — I959 Hypotension, unspecified: Secondary | ICD-10-CM | POA: Diagnosis present

## 2016-05-08 DIAGNOSIS — I129 Hypertensive chronic kidney disease with stage 1 through stage 4 chronic kidney disease, or unspecified chronic kidney disease: Secondary | ICD-10-CM | POA: Diagnosis present

## 2016-05-08 DIAGNOSIS — N390 Urinary tract infection, site not specified: Secondary | ICD-10-CM | POA: Diagnosis present

## 2016-05-08 DIAGNOSIS — E860 Dehydration: Secondary | ICD-10-CM | POA: Diagnosis present

## 2016-05-08 DIAGNOSIS — N179 Acute kidney failure, unspecified: Secondary | ICD-10-CM | POA: Diagnosis present

## 2016-05-08 DIAGNOSIS — Z9889 Other specified postprocedural states: Secondary | ICD-10-CM | POA: Diagnosis not present

## 2016-05-08 DIAGNOSIS — N1330 Unspecified hydronephrosis: Secondary | ICD-10-CM | POA: Diagnosis present

## 2016-05-08 DIAGNOSIS — E43 Unspecified severe protein-calorie malnutrition: Secondary | ICD-10-CM | POA: Diagnosis present

## 2016-05-08 DIAGNOSIS — Z951 Presence of aortocoronary bypass graft: Secondary | ICD-10-CM | POA: Diagnosis not present

## 2016-05-08 DIAGNOSIS — D649 Anemia, unspecified: Secondary | ICD-10-CM | POA: Diagnosis not present

## 2016-05-08 DIAGNOSIS — A419 Sepsis, unspecified organism: Secondary | ICD-10-CM | POA: Diagnosis present

## 2016-05-08 DIAGNOSIS — C61 Malignant neoplasm of prostate: Secondary | ICD-10-CM

## 2016-05-08 DIAGNOSIS — R9431 Abnormal electrocardiogram [ECG] [EKG]: Secondary | ICD-10-CM | POA: Diagnosis not present

## 2016-05-08 DIAGNOSIS — B3749 Other urogenital candidiasis: Secondary | ICD-10-CM | POA: Diagnosis present

## 2016-05-08 DIAGNOSIS — E875 Hyperkalemia: Secondary | ICD-10-CM | POA: Diagnosis present

## 2016-05-08 DIAGNOSIS — Z933 Colostomy status: Secondary | ICD-10-CM | POA: Diagnosis not present

## 2016-05-08 DIAGNOSIS — Z87891 Personal history of nicotine dependence: Secondary | ICD-10-CM | POA: Diagnosis not present

## 2016-05-08 DIAGNOSIS — I2510 Atherosclerotic heart disease of native coronary artery without angina pectoris: Secondary | ICD-10-CM | POA: Diagnosis present

## 2016-05-08 DIAGNOSIS — E872 Acidosis: Secondary | ICD-10-CM | POA: Diagnosis present

## 2016-05-08 DIAGNOSIS — R001 Bradycardia, unspecified: Secondary | ICD-10-CM | POA: Diagnosis present

## 2016-05-08 DIAGNOSIS — R5383 Other fatigue: Secondary | ICD-10-CM | POA: Diagnosis present

## 2016-05-08 DIAGNOSIS — N189 Chronic kidney disease, unspecified: Secondary | ICD-10-CM

## 2016-05-08 DIAGNOSIS — Z515 Encounter for palliative care: Secondary | ICD-10-CM | POA: Diagnosis not present

## 2016-05-08 DIAGNOSIS — I471 Supraventricular tachycardia: Secondary | ICD-10-CM | POA: Diagnosis present

## 2016-05-08 DIAGNOSIS — D72829 Elevated white blood cell count, unspecified: Secondary | ICD-10-CM

## 2016-05-08 DIAGNOSIS — Z681 Body mass index (BMI) 19 or less, adult: Secondary | ICD-10-CM | POA: Diagnosis not present

## 2016-05-08 DIAGNOSIS — N183 Chronic kidney disease, stage 3 (moderate): Secondary | ICD-10-CM

## 2016-05-08 LAB — CBC
HEMATOCRIT: 25.5 % — AB (ref 39.0–52.0)
Hemoglobin: 8.4 g/dL — ABNORMAL LOW (ref 13.0–17.0)
MCH: 29.3 pg (ref 26.0–34.0)
MCHC: 32.9 g/dL (ref 30.0–36.0)
MCV: 88.9 fL (ref 78.0–100.0)
Platelets: 290 10*3/uL (ref 150–400)
RBC: 2.87 MIL/uL — ABNORMAL LOW (ref 4.22–5.81)
RDW: 16.2 % — AB (ref 11.5–15.5)
WBC: 21.2 10*3/uL — ABNORMAL HIGH (ref 4.0–10.5)

## 2016-05-08 LAB — COMPREHENSIVE METABOLIC PANEL
ALBUMIN: 2.5 g/dL — AB (ref 3.5–5.0)
ALK PHOS: 112 U/L (ref 38–126)
ALT: 57 U/L (ref 17–63)
AST: 54 U/L — AB (ref 15–41)
Anion gap: 8 (ref 5–15)
BILIRUBIN TOTAL: 0.5 mg/dL (ref 0.3–1.2)
BUN: 64 mg/dL — AB (ref 6–20)
CALCIUM: 8.7 mg/dL — AB (ref 8.9–10.3)
CO2: 19 mmol/L — ABNORMAL LOW (ref 22–32)
CREATININE: 3.06 mg/dL — AB (ref 0.61–1.24)
Chloride: 106 mmol/L (ref 101–111)
GFR calc Af Amer: 20 mL/min — ABNORMAL LOW (ref >60)
GFR calc non Af Amer: 17 mL/min — ABNORMAL LOW (ref >60)
GLUCOSE: 161 mg/dL — AB (ref 65–99)
Potassium: 5.1 mmol/L (ref 3.5–5.1)
Sodium: 133 mmol/L — ABNORMAL LOW (ref 135–145)
TOTAL PROTEIN: 6.3 g/dL — AB (ref 6.5–8.1)

## 2016-05-08 LAB — MRSA PCR SCREENING: MRSA by PCR: NEGATIVE

## 2016-05-08 LAB — PHOSPHORUS: Phosphorus: 4.8 mg/dL — ABNORMAL HIGH (ref 2.5–4.6)

## 2016-05-08 LAB — ECHOCARDIOGRAM COMPLETE
Height: 68.5 in
WEIGHTICAEL: 1736 [oz_av]

## 2016-05-08 LAB — TROPONIN I
TROPONIN I: 0.03 ng/mL — AB (ref <0.03)
Troponin I: <0.03 ng/mL (ref <0.03)
Troponin I: 0.03 ng/mL (ref <0.03)

## 2016-05-08 LAB — MAGNESIUM: Magnesium: 1.4 mg/dL — ABNORMAL LOW (ref 1.7–2.4)

## 2016-05-08 LAB — TSH: TSH: 0.694 u[IU]/mL (ref 0.350–4.500)

## 2016-05-08 LAB — PREALBUMIN: PREALBUMIN: 8.4 mg/dL — AB (ref 18–38)

## 2016-05-08 MED ORDER — SODIUM CHLORIDE 0.9% FLUSH
3.0000 mL | Freq: Two times a day (BID) | INTRAVENOUS | Status: DC
  Administered 2016-05-08 – 2016-05-14 (×10): 3 mL via INTRAVENOUS

## 2016-05-08 MED ORDER — ASPIRIN EC 81 MG PO TBEC
81.0000 mg | DELAYED_RELEASE_TABLET | Freq: Every day | ORAL | Status: DC
  Administered 2016-05-08 – 2016-05-15 (×8): 81 mg via ORAL
  Filled 2016-05-08 (×8): qty 1

## 2016-05-08 MED ORDER — ACETAMINOPHEN 325 MG PO TABS
650.0000 mg | ORAL_TABLET | Freq: Four times a day (QID) | ORAL | Status: DC | PRN
  Administered 2016-05-08 – 2016-05-10 (×6): 650 mg via ORAL
  Filled 2016-05-08 (×7): qty 2

## 2016-05-08 MED ORDER — DEXTROSE 5 % IV SOLN
1.0000 g | INTRAVENOUS | Status: DC
  Administered 2016-05-08 – 2016-05-12 (×5): 1 g via INTRAVENOUS
  Filled 2016-05-08 (×6): qty 10

## 2016-05-08 MED ORDER — ONDANSETRON HCL 4 MG/2ML IJ SOLN: 4.0000 mg | Freq: Four times a day (QID) | INTRAMUSCULAR | Status: DC | PRN

## 2016-05-08 MED ORDER — SODIUM CHLORIDE 0.9 % IV BOLUS (SEPSIS)
1000.0000 mL | Freq: Once | INTRAVENOUS | Status: AC
  Administered 2016-05-08: 1000 mL via INTRAVENOUS

## 2016-05-08 MED ORDER — ATORVASTATIN CALCIUM 10 MG PO TABS
20.0000 mg | ORAL_TABLET | Freq: Every day | ORAL | Status: DC
  Administered 2016-05-08 – 2016-05-14 (×7): 20 mg via ORAL
  Filled 2016-05-08 (×7): qty 2

## 2016-05-08 MED ORDER — ACETAMINOPHEN 650 MG RE SUPP: 650.0000 mg | Freq: Four times a day (QID) | RECTAL | Status: DC | PRN

## 2016-05-08 MED ORDER — METOPROLOL TARTRATE 25 MG PO TABS
12.5000 mg | ORAL_TABLET | Freq: Two times a day (BID) | ORAL | Status: DC
  Administered 2016-05-08 – 2016-05-15 (×15): 12.5 mg via ORAL
  Filled 2016-05-08 (×15): qty 1

## 2016-05-08 MED ORDER — PANTOPRAZOLE SODIUM 40 MG PO TBEC
40.0000 mg | DELAYED_RELEASE_TABLET | Freq: Every day | ORAL | Status: DC
  Administered 2016-05-08 – 2016-05-09 (×2): 40 mg via ORAL
  Filled 2016-05-08 (×2): qty 1

## 2016-05-08 MED ORDER — SODIUM CHLORIDE 0.9 % IV SOLN
INTRAVENOUS | Status: AC
  Administered 2016-05-08: 03:00:00 via INTRAVENOUS

## 2016-05-08 MED ORDER — ONDANSETRON HCL 4 MG PO TABS: 4.0000 mg | ORAL_TABLET | Freq: Four times a day (QID) | ORAL | Status: DC | PRN

## 2016-05-08 MED ORDER — ENOXAPARIN SODIUM 30 MG/0.3ML ~~LOC~~ SOLN
30.0000 mg | SUBCUTANEOUS | Status: DC
  Administered 2016-05-08 – 2016-05-15 (×8): 30 mg via SUBCUTANEOUS
  Filled 2016-05-08 (×8): qty 0.3

## 2016-05-08 MED ORDER — SODIUM BICARBONATE 650 MG PO TABS
650.0000 mg | ORAL_TABLET | Freq: Four times a day (QID) | ORAL | Status: DC
  Administered 2016-05-08 – 2016-05-15 (×30): 650 mg via ORAL
  Filled 2016-05-08 (×30): qty 1

## 2016-05-08 MED ORDER — ADENOSINE 6 MG/2ML IV SOLN
6.0000 mg | Freq: Once | INTRAVENOUS | Status: AC
Start: 2016-05-08 — End: 2016-05-08
  Administered 2016-05-08: 6 mg via INTRAVENOUS
  Filled 2016-05-08: qty 2

## 2016-05-08 MED ORDER — IOPAMIDOL (ISOVUE-300) INJECTION 61%
15.0000 mL | Freq: Once | INTRAVENOUS | Status: AC | PRN
  Administered 2016-05-08: 15 mL via ORAL

## 2016-05-08 MED ORDER — ALBUTEROL SULFATE (2.5 MG/3ML) 0.083% IN NEBU: 2.5000 mg | INHALATION_SOLUTION | Freq: Four times a day (QID) | RESPIRATORY_TRACT | Status: DC | PRN

## 2016-05-08 MED ORDER — NEPRO/CARBSTEADY PO LIQD
237.0000 mL | Freq: Two times a day (BID) | ORAL | Status: DC
  Administered 2016-05-08 – 2016-05-15 (×10): 237 mL via ORAL
  Filled 2016-05-08 (×18): qty 237

## 2016-05-08 NOTE — Progress Notes (Addendum)
Initial Nutrition Assessment  DOCUMENTATION CODES:   Severe malnutrition in context of acute illness/injury, Underweight  INTERVENTION:  - Continue Nepro Shake PO BID, each supplement provides 425 kcal and 19 grams protein - Okay to give pt Ensure Enlive instead of Nepro Shake as long as chocolate flavor is not provided. - Continue to encourage PO intakes of meals and supplements. - RD will follow-up 10/19.  NUTRITION DIAGNOSIS:   Inadequate oral intake related to acute illness, poor appetite as evidenced by per patient/family report.  GOAL:   Patient will meet greater than or equal to 90% of their needs  MONITOR:   PO intake, Supplement acceptance, Weight trends, Labs, I & O's  REASON FOR ASSESSMENT:   Malnutrition Screening Tool, Consult Assessment of nutrition requirement/status  ASSESSMENT:   82 y.o. 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 Presented with somnolence and abnormal blood work. He's been taking care of by his daughter at home who is interested in placement patient has had decreased by mouth intake and patient has been more sedated and sleepy. Been having foul-smelling urine patient had his labs checked today by PCP. He was having some vague abdominal discomfort PTA.  Pt seen for MST and consult. BMI indicates underweight status. No intakes documented since admission. Pt states he did not have anything PO yesterday. Empty vanilla-flavored Ensure Enlive on bedside table and pt reports that he was informed he could only have liquids this AM and that this was the only item he consumed for breakfast. He states he is having some slight abdominal pain. No family/visitors present. Pt received a phone call and RD exited the room; upon returning, pt's  door was shut and he was sleeping. Will follow-up on Thursday to obtain further information at that time.  Unable to perform physical assessment but muscle wasting to upper body apparent, especially in clavicle area. Per chart review, pt weighed 100 lbs on 03/22/16 and then gained 23 lbs to gain 123 lbs on 04/02/16. Since that time, he has lost 15 lbs (12% body weight) which is significant for time frame. Will monitor weight trends during hospitalization as pt is currently on a fluid restriction and ordered high rate of IVF; weight changes may not have been entirely related to a loss of lean body mass and fat mass.  Medications reviewed; PRN Zofran, 40 mg oral Protonix/day. Labs reviewed; Na: 133 mmol/L, BUN: 64 mmol/L, creatinine: 3.06 mg/dL, Ca: 8.7 mg/dL, Phos: 4.8 mg/dL, Mg: 1.4 mg/dL, GFR: 20 mL/min, AST elevated but trending down.   IVF: NS @ 125 mL/hr   Diet Order:  Diet renal with fluid restriction Fluid restriction: 1200 mL Fluid; Room service appropriate? Yes; Fluid consistency: Thin  Skin:  Reviewed, no issues  Last BM:  Unknown; colostomy in place  Height:   Ht Readings from Last 1 Encounters:  05/08/16 5' 8.5" (1.74 m)    Weight:   Wt Readings from Last 1 Encounters:  05/08/16 108 lb 8 oz (49.2 kg)    Ideal Body Weight:  71.36 kg  BMI:  Body mass index is 16.26 kg/m.  Estimated Nutritional Needs:   Kcal:  1475-1625 (30-33 kcal/kg)  Protein:  55-65 grams  Fluid:  1.225 L/day (25 mL/kg/day)  EDUCATION NEEDS:   No education needs identified at this time    Jarome Matin,  MS, RD, LDN Inpatient Clinical Dietitian Pager # [phone] After hours/weekend pager # [phone]

## 2016-05-08 NOTE — Progress Notes (Signed)
Notified by Telemetry Pt's heart rate sustaining 160-165. BP 72/48 - 74/50. O2 sat 78-79 on Room Air. Pt is alert and oriented x4 and denies any chest pain but has c/o mild shortness of breath. Oxygen placed on Pt with improving O2 sats and EKG to be done MD paged.

## 2016-05-08 NOTE — Care Management Note (Signed)
Case Management Note  Patient Details  Name: Joseph Carrillo MRN: [id number] Date of Birth: [birth year]  Subjective/Objective:      urosepsis              Action/Plan: from home lives with the daughter   Expected Discharge Date:                  Expected Discharge Plan:  Home/Self Care  In-House Referral:     Discharge planning Services     Post Acute Care Choice:    Choice offered to:     DME Arranged:    DME Agency:     HH Arranged:    [place] Agency:     Status of Service:  In process, will continue to follow  If discussed at Long Length of Stay Meetings, dates discussed:    Additional Comments:Date:  May 08, 2016 Chart reviewed for concurrent status and case management needs. Will continue to follow the patient for status change: Discharge Planning: following for needs Expected discharge date: [id number] Velva Harman, BSN, [place], [place]  Leeroy Cha, RN 05/08/2016, 10:09 AM

## 2016-05-08 NOTE — Progress Notes (Signed)
PT Cancellation Note  Patient Details Name: Joseph Carrillo MRN: [id number] DOB: 1926-09-17   Cancelled Treatment:    Reason Eval/Treat Not Completed: Patient not medically ready (just transferred to SDU. will check back at another  time when medically ready.)   Marcelino Freestone PT [id number]  05/08/2016, 9:17 AM

## 2016-05-08 NOTE — Progress Notes (Signed)
  Echocardiogram 2D Echocardiogram has been performed.  Tresa Res 05/08/2016, 4:24 PM

## 2016-05-08 NOTE — ED Notes (Signed)
Dairon Sposato is the patient daughter. She would like to be contacted at [phone]

## 2016-05-08 NOTE — Progress Notes (Signed)
82 y/o ? CAd s/p CABg 2000 Bioprosth Vlv 2005 Htn Prostate Ca s/p Radiation Enterovesicular fistula s/p colostomy SUprapubioc cath CKD Prior smoking history  foll now locally with Dr. Diona Fanti Last admission had a nephrostomy his left side 04/02/2016 and on the right side as well At that admission because of elevated troponin but no further workup was performed  Called to the floor and bedside as patient hypoxic into the 80s, blood pressure 80/40, tachycardic 160 range seems to be SVT Note that patient is on metoprolol 12.5 twice a day  which he has not taken since admission   Alert wasn't oriented no apparent distress states he feels like this in the mornings No chest pain No sputum No vomiting Abdomen soft ostomy as well as suprapubic catheter noted PCN drains seem to be draining properly  P  adenosine 6 mg X1 given with drop in heart rate and now in the baseline 90 to 100 EKG repeat order Echocardiogram ordered Transfer to stepdown unit Bolus IV fluids 1 L blood pressure already is responding and is now in the 1 teen range Restart low-dose metoprolol to be given once bolused Because of poor IV access with a complaint For intravenous therapies No family at bedside will update later Keep npo except clear for now   Verneita Griffes, MD [facility] [phone]

## 2016-05-08 NOTE — Progress Notes (Signed)
CRITICAL VALUE ALERT  Critical value received:  Troponin 0.03  Date of notification:  05/08/2016  Time of notification:  [id number]  Critical value read back:Yes.    Nurse who received alert:  Jeannie Fend  MD notified (1st page):  Dr. Belinda Block  Time of first page:  60  MD notified (2nd page):  Time of second page:  Responding MD:  Dr. Belinda Block  Time MD responded:  1320

## 2016-05-08 NOTE — Progress Notes (Signed)
IV Adenosine given by Rapid Response RN. Noted Heart decrease to 85 and NSR. BP 125/69. Pt tolerated IV Adenosine well.

## 2016-05-08 NOTE — Telephone Encounter (Signed)
[facility] to let [facility] know pt will not be at appt today

## 2016-05-08 NOTE — Progress Notes (Signed)
MD in Room. Rapid Response Rn also in room. BP 86/52 with NS Fluid Bolus infusing. Heart Rate remains 160's O2 sats have improved to 95-100% on oxygen mask. Pt remains alert and oriented x4 and remains without c/o chest pain and shortness of breath improved Pt reports.

## 2016-05-08 NOTE — Progress Notes (Signed)
Remains NSR rate 80-90's. BP 125/70. Resp. 16. Room Air O2 sat checked and Pt now 98-100% on Room Air. Repeat EKG shown NSR. Pt to be transferred to [facility].

## 2016-05-08 NOTE — Progress Notes (Signed)
OT Cancellation Note  Patient Details Name: Joseph Carrillo MRN: [id number] DOB: October 21, 1926   Cancelled Treatment:    Reason Eval/Treat Not Completed: Patient not medically ready (just transferred to SDU. will check back at another time when medically ready.)  Kymere Fullington A 05/08/2016, 10:27 AM

## 2016-05-08 NOTE — H&P (Signed)
Joseph Carrillo [id number] DOB: June 14, 1927 DOA: 05/07/2016     PCP: [name], MD   Outpatient Specialists: urology   Patient coming from home Lives  With family    Chief Complaint: Fatigue abnormal blood work  HPI: Joseph Carrillo is a 82 y.o. 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 Been having foul-smelling urine patient had his labs checked today by PCP was noted to have leukocytosis up to 22.4 creatinine 3.19 and potassium 5.3 he was instructed to present to emerge department patient himself unable to provide detailed history. He apparently was having some vague abdominal discomfort Regarding pertinent Chronic problems: Patient in the past have had admissions for UTI patient has been admitted in the past to [facility] was discharged to rehabilitation and then came to leave his sister in [place] Recently was admitted to [facility] for presumed UTI associated with hyperkalemia with potassium up to 7.5 and worsening renal function. He was found to have bilateral hydronephrosis and was transferred to [facility] was seen in consult by urology. At baseline patient tracking this 2.0 patient undergone nephrostomy tube placement and his creatinine has improved to the time of discharge was down to 2.2. History of prostate cancer requiring extensive surgical procedures a suprapubic catheter placement and ostomy bag. In the past urine culture only grew multiple bacteria colonies suggestive of  contamination. He was discharged on September 14 to nursing home facility and was discharged on October 4  IN ER:  Temp (24hrs), Avg:97.9 F (36.6 C), Min:97.5 F (36.4 C), Max:98.6 F (37 C)   Heartrate as well as 4370 had percent R median blood pressure 102/43  WBC 23.9 hemoglobin 9.3 Sodium 133 potassium 5.3 BUN 64 creatinine 3.13 albumin 2.7  Chest x-ray initially helpful showed pneumomediastinum but this was not confirmed by CT no evidence of infection Following Medications were ordered in ER: Medications  sodium chloride 0.9 % bolus 1,000 mL (not administered)  cefTRIAXone (ROCEPHIN) 1 g in dextrose 5 % 50 mL IVPB (0 g Intravenous Stopped 05/07/16 2357)      Hospitalist was called for admission for Acute on chronic renal failure and possible UTI  Review of Systems:    Pertinent positives include: abdominal pain, fatigue,  Constitutional:  No weight loss, night sweats, Fevers, chills,  weight loss  HEENT:  No headaches, Difficulty swallowing,Tooth/dental problems,Sore throat,  No sneezing, itching, ear ache, nasal congestion, post nasal drip,  Cardio-vascular:  No chest pain, Orthopnea, PND, anasarca, dizziness, palpitations.no Bilateral lower extremity swelling  GI:  No heartburn, indigestion, nausea, vomiting, diarrhea, change in bowel habits, loss of appetite, melena, blood in stool, hematemesis Resp:  no shortness of breath at rest. No dyspnea on exertion, No excess mucus, no productive cough, No non-productive cough, No coughing up of blood.No change in color of mucus.No wheezing. Skin:  no rash or lesions. No jaundice GU:  no dysuria, change in color of urine, no urgency or frequency. No straining to urinate.  No flank pain.  Musculoskeletal:  No  joint pain or no joint swelling. No decreased range of motion. No back pain.  Psych:  No change in mood or affect. No depression or anxiety. No memory loss.  Neuro: no localizing neurological complaints, no tingling,  no weakness, no double vision, no gait abnormality, no slurred speech, no confusion  As per HPI otherwise 10 point review of systems negative.   Past Medical History: Past Medical History:  Diagnosis Date  . Blood in stool   . Blood transfusion without reported diagnosis   . CAD (coronary artery disease)    a. 2000 - Says he had CABG x 3 or 4.  . Chicken pox   . Colon polyp   . Elevated blood pressure reading   . Glaucoma   . Heart murmur   . Hx: UTI (urinary tract infection)   . Hyperlipidemia   . Prostate cancer ([place])   . Valvular heart disease    a. 2005 s/p Valve replacement - presumably bioprosthetic (not on anticoagulation).  He doesn't know which valve.   Past Surgical History:  Procedure Laterality Date  . CORONARY ARTERY BYPASS GRAFT    . IR GENERIC HISTORICAL  04/02/2016   IR NEPHROSTOMY PLACEMENT LEFT 04/02/2016 Sandi Mariscal, MD [facility]  . IR GENERIC HISTORICAL  04/02/2016   IR NEPHROSTOMY PLACEMENT RIGHT 04/02/2016 Sandi Mariscal, MD [facility]  . LUNG SURGERY    . PROSTATE SURGERY       Social History:  Ambulatory  Gilford Rile     reports that he quit smoking about 44 years ago. He has never used smokeless tobacco. He reports that he does not drink alcohol or use drugs.  Allergies:  No Known Allergies     Family History:   Family History  Problem Relation Age of Onset  . Heart disease Father     Pt says his brother had heart infection and died suddenly.    Medications: Prior to Admission medications   Medication Sig Start Date End Date Taking? Authorizing Provider  albuterol (PROVENTIL) (2.5 MG/3ML) 0.083% nebulizer solution Take 3 mLs (2.5 mg total) by nebulization every 6 (six) hours as needed for wheezing or shortness of breath. 04/05/16  Yes Reyne Dumas, MD  aspirin EC 81 MG tablet Take 81 mg by mouth daily at 12 noon.   Yes Historical Provider, MD  atorvastatin (LIPITOR) 20 MG tablet Take 20 mg by mouth every morning.    Yes Historical Provider,  MD  cloNIDine HCl (KAPVAY) 0.1 MG TB12 ER tablet Take 0.1 mg by mouth 2 (two) times daily.   Yes Historical Provider, MD  HYDROcodone-acetaminophen (NORCO/VICODIN) 5-325 MG tablet Take 1 tablet by mouth every 6 (six) hours as needed. Patient taking differently: Take 1 tablet by mouth every 6 (six) hours as needed for moderate pain or severe pain.  05/07/16  Yes Gay Filler Copland, MD  metoprolol tartrate (LOPRESSOR) 25 MG tablet Take 0.5 tablets (12.5 mg total) by mouth 2 (two) times daily. 04/05/16  Yes Reyne Dumas, MD  nitroGLYCERIN (NITROSTAT) 0.4 MG SL tablet Place 0.4 mg under the tongue every 5 (five) minutes as needed for chest pain.   Yes Historical Provider, MD  pantoprazole (PROTONIX) 40 MG tablet Take 40 mg by mouth every morning.    Yes Historical Provider, MD  sodium bicarbonate 650 MG tablet Take 650 mg by mouth 4 (four) times daily.   Yes Historical Provider, MD    Physical Exam: Patient Vitals for the past 24 hrs:  BP Temp Temp src  Pulse Resp SpO2 Height Weight  05/07/16 2254 (!) 102/43 - - (!) 43 15 100 % - -  05/07/16 2156 (!) 155/51 - - 66 18 100 % - -  05/07/16 2004 (!) 118/48 - - (!) 48 17 97 % - -  05/07/16 1927 (!) 119/43 97.6 F (36.4 C) Oral (!) 55 17 99 % - -  05/07/16 1857 (!) 120/42 - - - - - - -  05/07/16 1723 91/67 98.6 F (37 C) - (!) 53 16 96 % - -  05/07/16 1722 - - - - - - 5\' 8"  (1.727 m) 49 kg (108 lb)    1. General:  in No Acute distress strong Smelling of urine 2. Psychological: Alert but not Oriented 3. Head/ENT:     Dry Mucous Membranes                          Head Non traumatic, neck supple                            Poor Dentition 4. SKIN:  decreased Skin turgor,  Skin clean Dry and intact no rash 5. Heart: Regular rate and rhythm   Murmur, Rub or gallop 6. Lungs:   no wheezes or crackles   7. Abdomen: Soft,  Supra pubic tenderess, Non distended 8. Lower extremities: no clubbing, cyanosis, or edema 9. Neurologically Grossly intact, moving all  4 extremities equally  10. MSK: Normal range of motion   body mass index is 16.42 kg/m.  Labs on Admission:   Labs on Admission: I have personally reviewed following labs and imaging studies  CBC:  Recent Labs Lab 05/07/16 1227 05/07/16 1944  WBC 22.4 Repeated and verified X2.* 23.9*  NEUTROABS  --  21.7*  HGB 10.2* 9.3*  HCT 31.1* 28.4*  MCV 89.3 88.5  PLT 251.0 [id number]   Basic Metabolic Panel:  Recent Labs Lab 05/07/16 1227 05/07/16 1944  NA 133* 133*  K 5.3* 5.3*  CL 101 103  CO2 17* 20*  GLUCOSE 145* 126*  BUN 63* 64*  CREATININE 3.19* 3.13*  CALCIUM 8.9 9.1   GFR: Estimated Creatinine Clearance: 11.7 mL/min (by C-G formula based on SCr of 3.13 mg/dL (H)). Liver Function Tests:  Recent Labs Lab 05/07/16 1227 05/07/16 1944  AST 76* 73*  ALT 71* 75*  ALKPHOS 122* 125  BILITOT 0.5 0.7  PROT 6.7 7.3  ALBUMIN 3.0* 2.7*    Recent Labs Lab 05/07/16 1944  LIPASE 27   No results for input(s): AMMONIA in the last 168 hours. Coagulation Profile: No results for input(s): INR, PROTIME in the last 168 hours. Cardiac Enzymes: No results for input(s): CKTOTAL, CKMB, CKMBINDEX, TROPONINI in the last 168 hours. BNP (last 3 results) No results for input(s): PROBNP in the last 8760 hours. HbA1C: No results for input(s): HGBA1C in the last 72 hours. CBG: No results for input(s): GLUCAP in the last 168 hours. Lipid Profile: No results for input(s): CHOL, HDL, LDLCALC, TRIG, CHOLHDL, LDLDIRECT in the last 72 hours. Thyroid Function Tests: No results for input(s): TSH, T4TOTAL, FREET4, T3FREE, THYROIDAB in the last 72 hours. Anemia Panel: No results for input(s): VITAMINB12, FOLATE, FERRITIN, TIBC, IRON, RETICCTPCT in the last 72 hours. Urine analysis:    Component Value Date/Time   COLORURINE YELLOW 05/07/2016 1930   APPEARANCEUR TURBID (A) 05/07/2016 1930   LABSPEC 1.015 05/07/2016 1930   PHURINE 5.5 05/07/2016  [place] 05/07/2016 1930    [place] (A) 05/07/2016 1930   BILIRUBINUR NEGATIVE 05/07/2016 [place] NEGATIVE 05/07/2016 1930   PROTEINUR 100 (A) 05/07/2016 1930   NITRITE NEGATIVE 05/07/2016 1930   LEUKOCYTESUR LARGE (A) 05/07/2016 1930   Sepsis Labs: @LABRCNTIP (procalcitonin:4,lacticidven:4) )No results found for this or any previous visit (from the past 240 hour(s)).    UA   evidence of UTI     No results found for: HGBA1C  Estimated Creatinine Clearance: 11.7 mL/min (by C-G formula based on SCr of 3.13 mg/dL (H)).  BNP (last 3 results) No results for input(s): PROBNP in the last 8760 hours.   ECG REPORT Heart rate 43 Sinus bradycardia with PACs No ischemic changes QTC 411 Filed Weights   05/07/16 1722  Weight: 49 kg (108 lb)     Cultures:    Component Value Date/Time   SDES URINE, SUPRAPUBIC 03/29/2016 1953   SPECREQUEST NONE 03/29/2016 1953   CULT MULTIPLE SPECIES PRESENT, SUGGEST RECOLLECTION (A) 03/29/2016 1953   REPTSTATUS 03/31/2016 FINAL 03/29/2016 1953     Radiological Exams on Admission: Dg Chest 2 View  Result Date: 05/07/2016 CLINICAL DATA:  Acute onset of generalized weakness. Upper abdominal and chest pain. Leukocytosis. Initial encounter. EXAM: CHEST  2 VIEW COMPARISON:  None. FINDINGS: The lungs are well-aerated. There is no evidence of focal opacification, pleural effusion or pneumothorax. A vague curved density is noted overlying the right midlung zone, of uncertain significance. There are a vague linear lucencies surrounding the superior mediastinum on the frontal view. A large area of apparent air is noted at the anterior mediastinum, on the lateral view. Air appears to surround the heart posteriorly. Findings are concerning for a very large amount of pneumomediastinum. The heart is normal in size; the patient is status post median sternotomy, with an aortic valve replacement seen. No acute osseous abnormalities are seen. IMPRESSION: 1. Lungs clear bilaterally. Vague  curved density overlying the right midlung zone is of uncertain significance. 2. Suspect very large amount of pneumomediastinum. CT of the chest is recommended for further evaluation. Critical Value/emergent results were called by telephone at the time of interpretation on 05/07/2016 at 10:53 pm to Dr. Regenia Skeeter, who verbally acknowledged these results. Electronically Signed   By: Garald Balding M.D.   On: 05/07/2016 22:55    Chart has been reviewed    Assessment/Plan  82 y.o. 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 Leukocytosis is saying of dehydration and possible UTI no evidence of pulmonary disease noted. There are providers for any other source of infection Bradycardia we'll hold metoprolol and clonidine obtain EKG monitor on telemetry, obtain echo . Hyperkalemia mild will monitor on telemetry repeat potassium in the morning  . Prostate cancer [facility]) status post resection patient endorsing pelvic pain will obtain CT to evaluate for any metastatic spread . Protein-calorie malnutrition, severe will get nutritional consult check prealbumin . UTI (urinary tract infection) patient has recurrent UTIs versus colonization. Given significant and available laboratory count will treat for now the Rocephin and await results of urine culture . Acute on chronic renal failure (HCC)   atrophy secondary to dehydration secondary to poor by mouth intake will rehydrate and recheck kidney function . Anemia chronic significant  finding disease currently stable  Other plan as per orders.  DVT prophylaxis:    Lovenox     Code Status:  FULL CODE   as per patient    Family Communication:   Family not  at  Bedside    Disposition Plan:   likely will need  placement for rehabilitation                                                 Would benefit from PT/OT eval prior to DC    ordered                    Nutrition   consulted                          Consults called: none  Admission status:   inpatient       Level of care    tele          I have spent a total of 56 min on this admission   Soley Harriss 05/08/2016, 12:32 AM    [facility]  Pager [phone]   after 2 AM please page floor coverage PA If 7AM-7PM, please contact the day team taking care of the patient  [email or web address]  Password [email or web address]

## 2016-05-09 ENCOUNTER — Ambulatory Visit: Payer: Medicare Other | Admitting: Podiatry

## 2016-05-09 DIAGNOSIS — N189 Chronic kidney disease, unspecified: Secondary | ICD-10-CM

## 2016-05-09 DIAGNOSIS — E875 Hyperkalemia: Secondary | ICD-10-CM

## 2016-05-09 DIAGNOSIS — N179 Acute kidney failure, unspecified: Secondary | ICD-10-CM

## 2016-05-09 DIAGNOSIS — E43 Unspecified severe protein-calorie malnutrition: Secondary | ICD-10-CM

## 2016-05-09 DIAGNOSIS — Z9889 Other specified postprocedural states: Secondary | ICD-10-CM

## 2016-05-09 DIAGNOSIS — Z936 Other artificial openings of urinary tract status: Secondary | ICD-10-CM

## 2016-05-09 DIAGNOSIS — Z933 Colostomy status: Secondary | ICD-10-CM

## 2016-05-09 DIAGNOSIS — Z9359 Other cystostomy status: Secondary | ICD-10-CM

## 2016-05-09 DIAGNOSIS — N321 Vesicointestinal fistula: Secondary | ICD-10-CM

## 2016-05-09 DIAGNOSIS — N390 Urinary tract infection, site not specified: Secondary | ICD-10-CM

## 2016-05-09 LAB — GASTROINTESTINAL PANEL BY PCR, STOOL (REPLACES STOOL CULTURE)
ADENOVIRUS F40/41: NOT DETECTED
ASTROVIRUS: NOT DETECTED
CAMPYLOBACTER SPECIES: NOT DETECTED
CRYPTOSPORIDIUM: NOT DETECTED
CYCLOSPORA CAYETANENSIS: NOT DETECTED
ENTAMOEBA HISTOLYTICA: NOT DETECTED
ENTEROPATHOGENIC E COLI (EPEC): NOT DETECTED
Enteroaggregative E coli (EAEC): NOT DETECTED
Enterotoxigenic E coli (ETEC): NOT DETECTED
Giardia lamblia: NOT DETECTED
Norovirus GI/GII: NOT DETECTED
PLESIMONAS SHIGELLOIDES: NOT DETECTED
Rotavirus A: NOT DETECTED
Salmonella species: NOT DETECTED
Sapovirus (I, II, IV, and V): NOT DETECTED
Shiga like toxin producing E coli (STEC): NOT DETECTED
Shigella/Enteroinvasive E coli (EIEC): NOT DETECTED
VIBRIO CHOLERAE: NOT DETECTED
VIBRIO SPECIES: NOT DETECTED
YERSINIA ENTEROCOLITICA: NOT DETECTED

## 2016-05-09 LAB — C DIFFICILE QUICK SCREEN W PCR REFLEX
C DIFFICLE (CDIFF) ANTIGEN: NEGATIVE
C Diff interpretation: NOT DETECTED
C Diff toxin: NEGATIVE

## 2016-05-09 MED ORDER — MAGNESIUM SULFATE 2 GM/50ML IV SOLN
2.0000 g | Freq: Once | INTRAVENOUS | Status: AC
  Administered 2016-05-09: 2 g via INTRAVENOUS
  Filled 2016-05-09: qty 50

## 2016-05-09 MED ORDER — FAMOTIDINE 20 MG PO TABS
20.0000 mg | ORAL_TABLET | Freq: Every day | ORAL | Status: DC
  Administered 2016-05-09 – 2016-05-14 (×6): 20 mg via ORAL
  Filled 2016-05-09 (×6): qty 1

## 2016-05-09 MED ORDER — FLORANEX PO PACK
1.0000 g | PACK | Freq: Three times a day (TID) | ORAL | Status: DC
  Administered 2016-05-09 – 2016-05-15 (×19): 1 g via ORAL
  Filled 2016-05-09 (×21): qty 1

## 2016-05-09 MED ORDER — SODIUM CHLORIDE 0.9 % IV SOLN
INTRAVENOUS | Status: AC
  Administered 2016-05-09 – 2016-05-10 (×2): via INTRAVENOUS

## 2016-05-09 NOTE — Evaluation (Signed)
Occupational Therapy Evaluation Patient Details Name: Joseph Carrillo MRN: [id number] DOB: 01/03/1927 Today's Date: 05/09/2016    History of Present Illness 82 y.o. male admitted with fatigue and abnormal blood work. Dx of hydroureteronephrosis, ARF, hyperkalemia, UTI.  PMH obstructive uropathy, urostomy, colostomy, prostate cancer, CAD, CABG, GERD.    Clinical Impression   Pt admitted with fatigue and abnormal blood work. Pt currently with functional limitations due to the deficits listed below (see OT Problem List).  Pt will benefit from skilled OT to increase their safety and independence with ADL and functional mobility for ADL to facilitate discharge to venue listed below.     Follow Up Recommendations  Home health OT;SNF;Supervision/Assistance - 24 hour;Other (comment) (depending families ability to A)    Equipment Recommendations  Other (comment) (TBD)       Precautions / Restrictions Precautions Precautions: Fall Precaution Comments: multiple drains Restrictions Weight Bearing Restrictions: No      Mobility Bed Mobility Overal bed mobility: Needs Assistance Bed Mobility: Supine to Sit;Sit to Supine     Supine to sit: Mod assist Sit to supine: Min assist   General bed mobility comments: Mod A to raise trunk and pivot hips, pt able to scoot to EOB independently, min A for BLEs into bed  Transfers Overall transfer level: Needs assistance Equipment used: Rolling walker (2 wheeled) Transfers: Sit to/from Stand Sit to Stand: Min assist         General transfer comment: min A to rise, VCs hand placement    Balance Overall balance assessment: Needs assistance   Sitting balance-Leahy Scale: Fair       Standing balance-Leahy Scale: Poor                              ADL Overall ADL's : Needs assistance/impaired Eating/Feeding: Set up;Sitting   Grooming: Set up;Sitting                                 General ADL Comments:  Limited ADL eval due to fatigue-                Pertinent Vitals/Pain Pain Assessment: No/denies pain        Extremity/Trunk Assessment Upper Extremity Assessment Upper Extremity Assessment: Overall WFL for tasks assessed   Lower Extremity Assessment Lower Extremity Assessment: Overall WFL for tasks assessed   Cervical / Trunk Assessment Cervical / Trunk Assessment: Kyphotic (forward flexed head)   Communication Communication Communication: No difficulties   Cognition Arousal/Alertness: Awake/alert Behavior During Therapy: WFL for tasks assessed/performed Overall Cognitive Status: Within Functional Limits for tasks assessed                                Home Living Family/patient expects to be discharged to:: Private residence Living Arrangements: Children (with daughter Joseph Carrillo) Available Help at Discharge: Family;Available 24 hours/day Type of Home: House       Home Layout: One level               Home Equipment: [place] - 2 wheels;Cane - single point          Prior Functioning/Environment Level of Independence: Independent with assistive device(s)        Comments: Uses cane and RW        OT Problem List: Decreased strength;Decreased activity tolerance  OT Treatment/Interventions: Self-care/ADL training;DME and/or AE instruction;Patient/family education    OT Goals(Current goals can be found in the care plan section) Acute Rehab OT Goals Patient Stated Goal: return home OT Goal Formulation: With patient Time For Goal Achievement: 05/16/16 Potential to Achieve Goals: Good  OT Frequency: Min 2X/week           Co-evaluation PT/OT/SLP Co-Evaluation/Treatment: Yes Reason for Co-Treatment: Complexity of the patient's impairments (multi-system involvement);For patient/therapist safety PT goals addressed during session: Mobility/safety with mobility;Proper use of DME;Balance OT goals addressed during session: ADL's and self-care       End of Session Equipment Utilized During Treatment: Rolling walker Nurse Communication: Mobility status  Activity Tolerance: Patient tolerated treatment well Patient left: in bed;with call bell/phone within reach;with bed alarm set   Time: 1236-1300 OT Time Calculation (min): 24 min Charges:  OT General Charges $OT Visit: 1 Procedure OT Treatments $Self Care/Home Management : 8-22 mins G-Codes:    Payton Mccallum D 06-01-2016, 1:43 PM

## 2016-05-09 NOTE — Progress Notes (Signed)
Patient ID: Joseph Carrillo, male   DOB: [birth year], 82 y.o.   MRN: [id number]    Referring Physician(s): Dr. 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  He has been followed by urology.  He has a suprapubic catheter in place.  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  The patient is admitted now secondary to foul smelling urine and elevated WBC.  He had a CT scan that reveals some left sided hydronephrosis.  We have been asked to see him and evaluate his drains to make sure they are properly functioning.    Allergies: Review of patient's allergies indicates no known allergies.  Medications: Prior to Admission medications   Medication Sig Start Date End Date Taking? Authorizing Provider  albuterol (PROVENTIL) (2.5 MG/3ML) 0.083% nebulizer solution Take 3 mLs (2.5 mg total) by nebulization every 6 (six) hours as needed for wheezing or shortness of breath. 04/05/16  Yes Reyne Dumas, MD  aspirin EC 81 MG tablet Take 81 mg by mouth daily at 12 noon.   Yes Historical Provider, MD  atorvastatin (LIPITOR) 20 MG tablet Take 20 mg by mouth every morning.    Yes Historical Provider, MD  cloNIDine HCl (KAPVAY) 0.1 MG TB12 ER tablet Take 0.1 mg by mouth 2 (two) times daily.   Yes Historical Provider, MD  HYDROcodone-acetaminophen (NORCO/VICODIN) 5-325 MG tablet Take 1 tablet by mouth every 6 (six) hours as needed. Patient taking differently: Take 1 tablet by mouth every 6 (six) hours as needed for moderate pain or severe pain.  05/07/16  Yes Gay Filler Copland, MD  metoprolol tartrate (LOPRESSOR) 25 MG tablet Take 0.5 tablets (12.5 mg total) by mouth 2 (two) times daily. 04/05/16  Yes Reyne Dumas, MD  nitroGLYCERIN (NITROSTAT) 0.4 MG SL tablet Place 0.4 mg under the tongue every 5 (five)  minutes as needed for chest pain.   Yes Historical Provider, MD  pantoprazole (PROTONIX) 40 MG tablet Take 40 mg by mouth every morning.    Yes Historical Provider, MD  sodium bicarbonate 650 MG tablet Take 650 mg by mouth 4 (four) times daily.   Yes Historical Provider, MD    Vital Signs: BP (!) 142/49   Pulse (!) 128 Comment: with ambulation  Temp 98.3 F (36.8 C) (Oral)   Resp 15   Ht 5' 8.5" (1.74 m)   Wt 108 lb 8 oz (49.2 kg)   SpO2 100%   BMI 16.26 kg/m   Physical Exam: Abd: soft, colostomy in place, back reveals bilateral PCNs.  Both drain sites are c/d/i.  The left drain has some cloudy particulate-laden fluid present.  This drain was flushed with ease.  Minimal leakage around the entry site.  The right drain has clear yellow urine and does not leak, but flushes easily as well.    Imaging: Ct Abdomen Pelvis Wo Contrast  Result Date: 05/08/2016 CLINICAL DATA:  Initial evaluation for leukocytosis with bilateral or urostomy tubes in place. Also with acute abdominal pain. EXAM: CT ABDOMEN AND PELVIS WITHOUT CONTRAST TECHNIQUE: Multidetector CT imaging of the abdomen and pelvis was performed following the standard protocol without IV contrast. COMPARISON:  Prior CT from 03/28/2016. FINDINGS: Lower chest: Mild scatter atelectatic changes present within the visualized left lung base. Calcified granuloma present within the right middle lobe. Visualized lung bases are otherwise  unremarkable. Hepatobiliary: Limited noncontrast evaluation of the liver is unremarkable. Hyperdensity within the gallbladder lumen likely reflects stones. No CT findings to suggest acute cholecystitis. No biliary dilatation. Pancreas: Pancreas within normal limits without acute abnormality. Spleen: Spleen within normal limits. Adrenals/Urinary Tract: Adrenal glands within normal limits. Bilateral percutaneous urostomy tubes in place. Distal aspects of the tube is coiled within the renal pelvises bilaterally. There is  moderate left hydroureteronephrosis. No right-sided hydronephrosis or hydroureter. Bladder largely decompressed with a suprapubic catheter in place. Sub cm curvilinear hyperdensities within the posterior bladder lumen near the expected location of the left UVJ may reflect small stones. These were present on prior exam, although they are present on the right on that previous study, suggesting at these are mobile. Stomach/Bowel: Stomach within normal limits. No evidence for bowel obstruction. Right lower quadrant ostomy present. No definite acute inflammatory changes about the bowels. Vascular/Lymphatic: Prominent atheromatous plaque throughout the intra-abdominal aorta. Aorta again noted to be dilated up to 2.6 cm in diameter. No definite adenopathy identified. Reproductive: Prostate appears to be surgically absent. Other: Diffuse anasarca. No free air. Small amount of free fluid within the presacral space. Musculoskeletal: No acute osseous abnormality. No worrisome lytic or blastic osseous lesions. Degenerative spondylolysis and facet arthrosis noted within the lumbar spine. IMPRESSION: 1. Bilateral percutaneous nephrostomy tubes in place. There is moderate left hydroureteronephrosis. Bedside check to ensure that the left tube is functioning is recommended. Right renal collecting system is decompressed. Bladder decompressed with a suprapubic catheter in place. 2. No other definite acute intra-abdominal or pelvic process identified. 3. Cholelithiasis without evidence for acute cholecystitis. 4. Anasarca. 5. Ectatic intra-abdominal aorta with atherosclerosis, stable from recent CT. Please see prior report for follow-up recommendations regarding this finding. Electronically Signed   By: Jeannine Boga M.D.   On: 05/08/2016 02:49   Dg Chest 2 View  Result Date: 05/07/2016 CLINICAL DATA:  Acute onset of generalized weakness. Upper abdominal and chest pain. Leukocytosis. Initial encounter. EXAM: CHEST  2 VIEW  COMPARISON:  None. FINDINGS: The lungs are well-aerated. There is no evidence of focal opacification, pleural effusion or pneumothorax. A vague curved density is noted overlying the right midlung zone, of uncertain significance. There are a vague linear lucencies surrounding the superior mediastinum on the frontal view. A large area of apparent air is noted at the anterior mediastinum, on the lateral view. Air appears to surround the heart posteriorly. Findings are concerning for a very large amount of pneumomediastinum. The heart is normal in size; the patient is status post median sternotomy, with an aortic valve replacement seen. No acute osseous abnormalities are seen. IMPRESSION: 1. Lungs clear bilaterally. Vague curved density overlying the right midlung zone is of uncertain significance. 2. Suspect very large amount of pneumomediastinum. CT of the chest is recommended for further evaluation. Critical Value/emergent results were called by telephone at the time of interpretation on 05/07/2016 at 10:53 pm to Dr. Regenia Skeeter, who verbally acknowledged these results. Electronically Signed   By: Garald Balding M.D.   On: 05/07/2016 22:55   Ct Chest Wo Contrast  Result Date: 05/08/2016 CLINICAL DATA:  Leukocytosis, question of pneumomediastinum seen on chest radiograph EXAM: CT CHEST WITHOUT CONTRAST TECHNIQUE: Multidetector CT imaging of the chest was performed following the standard protocol without IV contrast. COMPARISON:  Same day chest radiograph, bases from CT abdomen dated 03/28/2016. FINDINGS: Cardiovascular: The patient is status post median sternotomy and aortic valvular replacement. Mild fusiform ectasia of the ascending aorta measuring up to 3.9 cm.  The descending aortic aneurysm. Atheromatous calcifications of the aorta. Dense coronary arteriosclerosis is identified. The heart is normal in size. There is no pericardial effusion. Mediastinum/Nodes: No pneumomediastinum. Skin fold artifact likely the  cause of curvilinear density seen on same day chest radiograph. Calcified appearing subcarinal lymph nodes consistent with old granulomatous disease. Mild thyromegaly. Lungs/Pleura: 10 x 7 mm circumscribed noncalcified nodule in the left lower lobe medially series 5, image 103. Noncalcified right middle lobe lateral segment 2 mm nodular density, series 5, image 82, calcified right lower lobe 3 mm nodule series 5, image 85 and a calcified 5 x 6 mm nodule the right middle lobe abutting the major fissure. No pneumothorax, effusion or pulmonary consolidation. Upper Abdomen: Splenic calcifications consistent with old granulomatous disease. There is mild ectasia and/or dilatation of the left renal collecting system. No definite source of obstruction visualized. PE few rounded calcification also noted associated with the pancreas. Musculoskeletal: No acute osseous abnormality. IMPRESSION: No pneumomediastinum. Skin fold artifact likely the cause of curvilinear density seen on chest radiograph over the right hemithorax. Calcified and noncalcified pulmonary nodules. This in conjunction with calcified subcarinal and splenic calcifications likely reflect old granulomatous disease. There is however a dominant noncalcified nodule in the left lower lobe measuring 10 x 7 mm, mean 8.5 mm. Non-contrast chest CT at 3-6 months is recommended. If the nodules are stable at time of repeat CT, then future CT at 18-24 months (from 9/17 ct) is considered optional for low-risk patients, all but is recommended for high-risk patients. This recommendation follows the consensus statement: Guidelines for Management of Incidental Pulmonary Nodules Detected on CT Images:From the Fleischner Society 2017; published online before print (10.1148/radiol.IJ:2314499). Electronically Signed   By: Ashley Royalty M.D.   On: 05/08/2016 00:31    Labs:  CBC:  Recent Labs  04/17/16 05/07/16 1227 05/07/16 1944 05/08/16 0405  WBC 9.1 22.4 Repeated and  verified X2.* 23.9* 21.2*  HGB 8.1* 10.2* 9.3* 8.4*  HCT 27* 31.1* 28.4* 25.5*  PLT 195 251.0 322 290    COAGS:  Recent Labs  03/28/16 2156 04/02/16 0619 04/02/16 1232  INR 1.09 >10.00* 1.15  APTT 42*  --  32    BMP:  Recent Labs  04/04/16 0759 04/05/16 0908 05/07/16 1227 05/07/16 1944 05/08/16 0405  NA 141 144 133* 133* 133*  K 5.5* 4.3 5.3* 5.3* 5.1  CL 120* 122* 101 103 106  CO2 14* 15* 17* 20* 19*  GLUCOSE 76 173* 145* 126* 161*  BUN 48* 36* 63* 64* 64*  CALCIUM 8.5* 8.6* 8.9 9.1 8.7*  CREATININE 2.60* 2.20* 3.19* 3.13* 3.06*  GFRNONAA 21* 25*  --  17* 17*  GFRAA 24* 29*  --  19* 20*    LIVER FUNCTION TESTS:  Recent Labs  04/05/16 0908 05/07/16 1227 05/07/16 1944 05/08/16 0405  BILITOT 0.3 0.5 0.7 0.5  AST 66* 76* 73* 54*  ALT 75* 71* 75* 57  ALKPHOS 49 122* 125 112  PROT 5.8* 6.7 7.3 6.3*  ALBUMIN 2.4* 3.0* 2.7* 2.5*    Assessment and Plan: 1. B PCNs secondary to obstructive uropathy -the patient's (B) PCNs flush well and appear to be in good positioning.  It is likely he is chronically refluxing from his bladder back up the left side causing some hydronephrosis.  This is also likely as some of the output from his drain appears similar to the output in his SP tube gravity bag.  There is no need for injection or exchange at this  time.  We would recommend however, if this drains will be left in place that they be exchanged between 6-8 weeks after placement, as per standard protocol.  Urology consult is pending.  No further IR intervention warranted at this time, unless urology has any other recommendations after their evaluation.  Electronically Signed: Henreitta Cea 05/09/2016, 2:34 PM   I spent a total of 25 Minutes at the the patient's bedside AND on the patient's hospital floor or unit, greater than 50% of which was counseling/coordinating care for bilateral obstructive uropathy, (B) PCNs

## 2016-05-09 NOTE — Consult Note (Signed)
Urology Consult   Physician requesting consult: Joseph Carrillo  Reason for consult: Elevated Cr and left sided hydronephrosis  History of Present Illness: Joseph Carrillo is a 82 y.o. 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   He was admitted in Sept 2017 for renal failure with bilateral hydro at which time he had bilateral nephrostomy tubes placed with improvement in renal function (Cr 2.2).  Urine cultures showed multiple bacterial colonies with probable contamination.  He was d/c'd to a SNF and then to home.   His current issues are thought to be due to UTI with obstructive uropathy.  CT A/P revealed moderate left hydroureteronephrosis with perc tubes and SP tube in place and no other intra-abdominal abnormalities noted. On admission WBC 24, Cr 3.19, K 5.3.   UA nitrite negative with many bacteria, large leukocytes, and large RBCs.  He was started on Rocephin.  Yesterday the pt had an episode of SVT with hypotension.  This resolved with adenosine and he has remained in NSR with stable BP.  Urine and blood cultures were ordered today.   The pt is alert and awake but is unable to provide adequate hx or information.  The RN is currently trying to clean the pt up as his ostomy bag is leaking and she does not have supplies to change it.     Past Medical History:  Diagnosis Date  . Blood in stool   . Blood transfusion without reported diagnosis   . CAD (coronary artery disease)    a. 2000 - Says he had CABG x 3 or 4.  . Chicken pox   . Colon polyp   . Elevated blood pressure reading   . Glaucoma   . Heart murmur   . Hx: UTI (urinary tract infection)   . Hyperlipidemia   . Prostate cancer ([place])   . Valvular heart disease    a. 2005 s/p Valve replacement - presumably bioprosthetic (not on anticoagulation).  He doesn't know  which valve.    Past Surgical History:  Procedure Laterality Date  . CORONARY ARTERY BYPASS GRAFT    . IR GENERIC HISTORICAL  04/02/2016   IR NEPHROSTOMY PLACEMENT LEFT 04/02/2016 Sandi Mariscal, MD [facility]  . IR GENERIC HISTORICAL  04/02/2016   IR NEPHROSTOMY PLACEMENT RIGHT 04/02/2016 Sandi Mariscal, MD [facility]  . LUNG SURGERY    . PROSTATE SURGERY      Current Hospital Medications:  Home Meds:   Scheduled Meds: . aspirin EC  81 mg Oral Q1200  . atorvastatin  20 mg Oral q1800  . cefTRIAXone (ROCEPHIN)  IV  1 g Intravenous Q24H  . enoxaparin (LOVENOX) injection  30 mg Subcutaneous Q24H  . famotidine  20 mg Oral QHS  . feeding supplement (NEPRO CARB STEADY)  237 mL Oral BID BM  . lactobacillus  1 g Oral TID WC  . magnesium sulfate 1 - 4 g bolus IVPB  2 g Intravenous Once  . metoprolol tartrate  12.5 mg Oral BID  . sodium bicarbonate  650 mg Oral QID  . sodium chloride flush  3 mL Intravenous Q12H   Continuous Infusions: . sodium chloride     PRN Meds:.acetaminophen **OR** acetaminophen, albuterol, ondansetron **OR** ondansetron (ZOFRAN) IV  Allergies: No Known Allergies  Family History  Problem Relation Age of Onset  . Heart disease  Father     Pt says his brother had heart infection and died suddenly.    Social History:  reports that he quit smoking about 44 years ago. He has never used smokeless tobacco. He reports that he does not drink alcohol or use drugs.  ROS: A complete review of systems was not performed as the pt was unable to provide information and no family members are present.   Physical Exam:  Vital signs in last 24 hours: Temp:  [97.5 F (36.4 C)-99.1 F (37.3 C)] 98 F (36.7 C) (10/18 0800) Pulse Rate:  [42-85] 67 (10/18 0800) Resp:  [10-20] 15 (10/18 0800) BP: (109-148)/(35-58) 142/49 (10/18 0800) SpO2:  [81 %-100 %] 100 % (10/18 0800) Constitutional:  Alert, No acute distress; very thin Cardiovascular: Regular rate and rhythm Respiratory:  Normal respiratory effort GI: Abdomen is soft, nondistended, ostomy functioning with copious amount of grey/brown loose stool GU: bilateral perc tubes in place; right functioning well with clear/yellow urine in bag; right with thick, white sediment in tubing and little in bag; SP tube with thick stool-appearing drainage only Lymphatic: No lymphadenopathy Neurologic: Grossly intact, no focal deficits Psychiatric: Normal mood and affect  Laboratory Data:   Recent Labs  05/07/16 1227 05/07/16 1944 05/08/16 0405  WBC 22.4 Repeated and verified X2.* 23.9* 21.2*  HGB 10.2* 9.3* 8.4*  HCT 31.1* 28.4* 25.5*  PLT 251.0 322 290     Recent Labs  05/07/16 1227 05/07/16 1944 05/08/16 0405  NA 133* 133* 133*  K 5.3* 5.3* 5.1  CL 101 103 106  GLUCOSE 145* 126* 161*  BUN 63* 64* 64*  CALCIUM 8.9 9.1 8.7*  CREATININE 3.19* 3.13* 3.06*     Results for orders placed or performed during the hospital encounter of 05/07/16 (from the past 24 hour(s))  Troponin I (q 6hr x 3)     Status: Abnormal   Collection Time: 05/08/16  2:34 PM  Result Value Ref Range   Troponin I 0.03 (HH) <0.03 ng/mL   Recent Results (from the past 240 hour(s))  Blood culture (routine x 2)     Status: None (Preliminary result)   Collection Time: 05/07/16  5:01 PM  Result Value Ref Range Status   Specimen Description BLOOD RIGHT ARM  Final   Special Requests BOTTLES DRAWN AEROBIC AND ANAEROBIC 5CC  Final   Culture   Final    NO GROWTH < 24 HOURS Performed at [facility]    Report Status PENDING  Incomplete  MRSA PCR Screening     Status: None   Collection Time: 05/08/16  2:40 AM  Result Value Ref Range Status   MRSA by PCR NEGATIVE NEGATIVE Final    Comment:        The GeneXpert MRSA Assay (FDA approved for NASAL specimens only), is one component of a comprehensive MRSA colonization surveillance program. It is not intended to diagnose MRSA infection nor to guide or monitor treatment for MRSA  infections.   Culture, blood (Routine X 2) w Reflex to ID Panel     Status: None (Preliminary result)   Collection Time: 05/08/16  4:05 AM  Result Value Ref Range Status   Specimen Description BLOOD LEFT ARM  Final   Special Requests BOTTLES DRAWN AEROBIC AND ANAEROBIC 5 CC  Final   Culture   Final    NO GROWTH < 12 HOURS Performed at [facility]    Report Status PENDING  Incomplete    Renal Function:  Recent Labs  05/07/16 1227 05/07/16 1944 05/08/16 0405  CREATININE 3.19* 3.13* 3.06*   Estimated Creatinine Clearance: 12.1 mL/min (by C-G formula based on SCr of 3.06 mg/dL (H)).  Radiologic Imaging: Ct Abdomen Pelvis Wo Contrast  Result Date: 05/08/2016 CLINICAL DATA:  Initial evaluation for leukocytosis with bilateral or urostomy tubes in place. Also with acute abdominal pain. EXAM: CT ABDOMEN AND PELVIS WITHOUT CONTRAST TECHNIQUE: Multidetector CT imaging of the abdomen and pelvis was performed following the standard protocol without IV contrast. COMPARISON:  Prior CT from 03/28/2016. FINDINGS: Lower chest: Mild scatter atelectatic changes present within the visualized left lung base. Calcified granuloma present within the right middle lobe. Visualized lung bases are otherwise unremarkable. Hepatobiliary: Limited noncontrast evaluation of the liver is unremarkable. Hyperdensity within the gallbladder lumen likely reflects stones. No CT findings to suggest acute cholecystitis. No biliary dilatation. Pancreas: Pancreas within normal limits without acute abnormality. Spleen: Spleen within normal limits. Adrenals/Urinary Tract: Adrenal glands within normal limits. Bilateral percutaneous urostomy tubes in place. Distal aspects of the tube is coiled within the renal pelvises bilaterally. There is moderate left hydroureteronephrosis. No right-sided hydronephrosis or hydroureter. Bladder largely decompressed with a suprapubic catheter in place. Sub cm curvilinear hyperdensities within  the posterior bladder lumen near the expected location of the left UVJ may reflect small stones. These were present on prior exam, although they are present on the right on that previous study, suggesting at these are mobile. Stomach/Bowel: Stomach within normal limits. No evidence for bowel obstruction. Right lower quadrant ostomy present. No definite acute inflammatory changes about the bowels. Vascular/Lymphatic: Prominent atheromatous plaque throughout the intra-abdominal aorta. Aorta again noted to be dilated up to 2.6 cm in diameter. No definite adenopathy identified. Reproductive: Prostate appears to be surgically absent. Other: Diffuse anasarca. No free air. Small amount of free fluid within the presacral space. Musculoskeletal: No acute osseous abnormality. No worrisome lytic or blastic osseous lesions. Degenerative spondylolysis and facet arthrosis noted within the lumbar spine. IMPRESSION: 1. Bilateral percutaneous nephrostomy tubes in place. There is moderate left hydroureteronephrosis. Bedside check to ensure that the left tube is functioning is recommended. Right renal collecting system is decompressed. Bladder decompressed with a suprapubic catheter in place. 2. No other definite acute intra-abdominal or pelvic process identified. 3. Cholelithiasis without evidence for acute cholecystitis. 4. Anasarca. 5. Ectatic intra-abdominal aorta with atherosclerosis, stable from recent CT. Please see prior report for follow-up recommendations regarding this finding. Electronically Signed   By: Jeannine Boga M.D.   On: 05/08/2016 02:49   Dg Chest 2 View  Result Date: 05/07/2016 CLINICAL DATA:  Acute onset of generalized weakness. Upper abdominal and chest pain. Leukocytosis. Initial encounter. EXAM: CHEST  2 VIEW COMPARISON:  None. FINDINGS: The lungs are well-aerated. There is no evidence of focal opacification, pleural effusion or pneumothorax. A vague curved density is noted overlying the right  midlung zone, of uncertain significance. There are a vague linear lucencies surrounding the superior mediastinum on the frontal view. A large area of apparent air is noted at the anterior mediastinum, on the lateral view. Air appears to surround the heart posteriorly. Findings are concerning for a very large amount of pneumomediastinum. The heart is normal in size; the patient is status post median sternotomy, with an aortic valve replacement seen. No acute osseous abnormalities are seen. IMPRESSION: 1. Lungs clear bilaterally. Vague curved density overlying the right midlung zone is of uncertain significance. 2. Suspect very large amount of pneumomediastinum. CT of the chest is recommended for further evaluation. Critical  Value/emergent results were called by telephone at the time of interpretation on 05/07/2016 at 10:53 pm to Dr. Regenia Skeeter, who verbally acknowledged these results. Electronically Signed   By: Garald Balding M.D.   On: 05/07/2016 22:55   Ct Chest Wo Contrast  Result Date: 05/08/2016 CLINICAL DATA:  Leukocytosis, question of pneumomediastinum seen on chest radiograph EXAM: CT CHEST WITHOUT CONTRAST TECHNIQUE: Multidetector CT imaging of the chest was performed following the standard protocol without IV contrast. COMPARISON:  Same day chest radiograph, bases from CT abdomen dated 03/28/2016. FINDINGS: Cardiovascular: The patient is status post median sternotomy and aortic valvular replacement. Mild fusiform ectasia of the ascending aorta measuring up to 3.9 cm. The descending aortic aneurysm. Atheromatous calcifications of the aorta. Dense coronary arteriosclerosis is identified. The heart is normal in size. There is no pericardial effusion. Mediastinum/Nodes: No pneumomediastinum. Skin fold artifact likely the cause of curvilinear density seen on same day chest radiograph. Calcified appearing subcarinal lymph nodes consistent with old granulomatous disease. Mild thyromegaly. Lungs/Pleura: 10 x 7  mm circumscribed noncalcified nodule in the left lower lobe medially series 5, image 103. Noncalcified right middle lobe lateral segment 2 mm nodular density, series 5, image 82, calcified right lower lobe 3 mm nodule series 5, image 85 and a calcified 5 x 6 mm nodule the right middle lobe abutting the major fissure. No pneumothorax, effusion or pulmonary consolidation. Upper Abdomen: Splenic calcifications consistent with old granulomatous disease. There is mild ectasia and/or dilatation of the left renal collecting system. No definite source of obstruction visualized. PE few rounded calcification also noted associated with the pancreas. Musculoskeletal: No acute osseous abnormality. IMPRESSION: No pneumomediastinum. Skin fold artifact likely the cause of curvilinear density seen on chest radiograph over the right hemithorax. Calcified and noncalcified pulmonary nodules. This in conjunction with calcified subcarinal and splenic calcifications likely reflect old granulomatous disease. There is however a dominant noncalcified nodule in the left lower lobe measuring 10 x 7 mm, mean 8.5 mm. Non-contrast chest CT at 3-6 months is recommended. If the nodules are stable at time of repeat CT, then future CT at 18-24 months (from 9/17 ct) is considered optional for low-risk patients, all but is recommended for high-risk patients. This recommendation follows the consensus statement: Guidelines for Management of Incidental Pulmonary Nodules Detected on CT Images:From the Fleischner Society 2017; published online before print (10.1148/radiol.IJ:2314499). Electronically Signed   By: Ashley Royalty M.D.   On: 05/08/2016 00:31     Impression/Recommendation  Left hydroureteronephrosis--RN to hand irrigate bilateral nephrostomy tubes as left appears to be occluded. IM has placed consult to IR to eval left tube as it may need to be replaced.   SP tube is in place and functioning.  It should be irrigated prn.  Question if pt has  a new fistula due to appearance of drainage in SP tube.  May need repeat CT A/P with contrast. Monitor closely.  UA is not impressive for infection and will always appear "dirty" due to SP tube but WBC is elevated with no other apparent source. RN is collecting specimens today for culture, however, pt has already had 1-2 doses of Rocephin so the validity will be questionable.   Ostomy care to be addressed as soon as possible in order to keep all other sites clean.   WBC and Cr have been ordered for today and are pending.  Will review when available.   [place], [place] 05/09/2016, 11:33 AM  I agree with Amanda's assessment and plan as above. This  is an unfortunate and difficult case. He may have recurrent colovesical fistula looking at the sp tube drainage. If so, pt not a candidate for surgical repair with advanced disease and medical condition. With bilateral ureteral obstruction, doubt reflux as suggested by IR is the culprit of hydro--I feel that pcn nephrostomy tube was probably not draining. Hopefully with proper irrigation of his pcn and sp tubes, proper drainage will result. If sp drainage remains feculent consider GS consult.

## 2016-05-09 NOTE — Progress Notes (Signed)
Chaplain following in response to consult for advance directives.   Spoke with pt, who was asleep upon chaplain arrival, but awoke and was alert and oriented.  Pt stated he recalls signing something "a couple years ago" but does not remember what this was.  Stated he is interested in completing advance directive, but would like to do so when daughter is present.  Chaplain provided education around process for completing advance directive and education around when health care power of attorney becomes effective.    Left advance directive paperwork with pt.  He can complete form with daughter with exception of the signature page.  Will follow up with pt and daughter to complete HCPOA.     [place], [place]

## 2016-05-09 NOTE — Progress Notes (Addendum)
PROGRESS NOTE  Joseph Carrillo [id number] DOB: 23-Aug-1926 DOA: 05/07/2016 PCP: [name], MD  HPI/Recap of past 24 hours:  Patient was transferred to stepdown due to sVt and hypotension, s/p adenosine, now back normal sinus rhythm, bp stable  Patient is seen together with his daughter and his son in room, RN also in room He report chronic suprapubic abdominal discomfort for the last few months, report left flank discomfort, denies chest pain or sob, denies fever Report watery stool in colostomy bag, also foul smelling urine in suprapubic catheter, Bilateral nephrostomy tubes also in place Report no appetite and continue to loose weight  Assessment/Plan: Active Problems:   History of urostomy   Colostomy in place [facility])   Prostate cancer ([place])   Hyperkalemia   UTI (urinary tract infection)   Leukocytosis   Anemia   Protein-calorie malnutrition, severe   Acute on chronic renal failure ([place])   Acute renal failure (ARF) ([place])  82 y.o. 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 Leukocytosis  from dehydration and possible UTI, patient also has watery stool in colostomy bag, foul smelling urine vs stool.  Ct chest no penumomediastinum, no pna,  CT abdomen showed bilateral nephrostomy tubes, left sided hydrouretonephrosis and some mobile stone in the bladder Blood culture pending, urine culture from bilateral nephrostomy tube and suprapubic catheter pending. mrsa screen negative, stool studies pending Currently on rocephin, start on probiotics, d/c ppi, start pepcid due to watery stool  Obstructive uropathy: s/p bilateral nephrostomy tube placement from prior hospitalization, ct ab showed persistent moderate left sided  hydroureteronephrosis, urology and IR consulted   UTI (urinary tract infection)? patient has recurrent UTIs versus colonization (h/o enterovesicular fistula), s/p colostomy and suprapubic catherter . Given leukocytosis, cr elevation, abx Rocephin started from admission, await results of urine culture  . Acute on chronic renal failure (HCC) (unclear baseline cr, patient report required dialysis at some point in the past).   continue hydration,defer  ir and urology for obstructive uropathy management, treat possible uti?    . SVT broked with adenosine, now restarted back on betablocker which was held due to initial Bradycardia , continue hold clonidine,  monitor on telemetry, echo lvef wnl, grade II diastolic dysfunction, no wall motion abmormalities tsh 0.6,  Mag 1.4, replace mag  . Hyperkalemia mild,, metabolic acidosis,  improving, continue bicarb supplement  . Prostate cancer [facility]) status post resection patient endorsing pelvic pain, CTab pel, ct chest did not report metastatic spread, psa 0.  . Protein-calorie malnutrition, severe , Prealbumin 8.4, nutrition consult Body mass index is 16.26 kg/m.   .. Anemia chronic : anemia work up pending, monitor, transfuse prn to keep hgb >7  H/o CAD s/p cabg in 2000, h/o valvular heart disease s/p bioprosthetic valve replacement in 2005, unknown detail; no chest pain, continue betablocker, asa, statin Echo lvef wnl, grade II diastolic dysfunction, no wall motion abmormalities He was recently evaluated by cardiology at [facility], no recommendation of ischemic work up  a dominant noncalcified nodule in the left lower lobe measuring 10 x 7 mm, mean 8.5 mm. Non-contrast chest CT at 3-6 months is recommended   FTT: patient has multiple hospitalization since august this year, with progressive weakness, will need snf placement  DVT prophylaxis:  Lovenox 30 mg sub Q    Code Status:  FULL CODE   as per patient    Family Communication:    patient , daughter and son in room, patient is alert and oriented, he reports daughter is the contact person, he report his wife ( daughter's stepmother) is not in good health I have requested health care POA paper work,  Wife Joseph Carrillo called at [phone]. Wife states she is the contact person    Disposition Plan:   SNF                                                 Consultants:  Urology  IR  Procedures:  none  Antibiotics:  rocephin   Objective: BP (!) 118/41   Pulse (!) 51   Temp 98.9 F (37.2 C) (Oral)   Resp 13   Ht 5' 8.5" (1.74 m)   Wt 49.2 kg (108 lb 8 oz)   SpO2 100%   BMI 16.26 kg/m   Intake/Output Summary (Last 24 hours) at 05/09/16 0833 Last data filed at 05/09/16 0600  Gross per 24 hour  Intake             1300 ml  Output             1175 ml  Net              125 ml   Filed Weights   05/07/16 1722 05/08/16 0231  Weight: 49 kg (108 lb) 49.2 kg (108 lb 8 oz)    Exam:   General:  Frail , thin, oriented x3  Cardiovascular: RRR  Respiratory: CTABL  Abdomen: + watery stool in colostomy bag, + suprapubic catheter, + bilateral nephrostomy tubes, generalized mild discomfort lower abdomen, suprapubic region, no guarding, no rebound, positive BS  Musculoskeletal: No Edema  Neuro: aaox3  Data Reviewed: Basic Metabolic Panel:  Recent Labs Lab 05/07/16 1227 05/07/16 1944 05/08/16 0405  NA 133* 133* 133*  K 5.3* 5.3* 5.1  CL 101 103 106  CO2 17* 20* 19*  GLUCOSE 145* 126* 161*  BUN 63* 64* 64*  CREATININE 3.19* 3.13* 3.06*  CALCIUM 8.9 9.1 8.7*  MG  --   --  1.4*  PHOS  --   --  4.8*   Liver Function Tests:  Recent Labs Lab 05/07/16 1227 05/07/16 1944 05/08/16 0405  AST 76* 73* 54*  ALT 71* 75* 57  ALKPHOS 122* 125 112  BILITOT 0.5 0.7 0.5  PROT 6.7 7.3 6.3*  ALBUMIN 3.0* 2.7* 2.5*    Recent Labs Lab 05/07/16 1944  LIPASE 27   No results for input(s): AMMONIA in the last 168 hours. CBC:  Recent Labs Lab  05/07/16 1227 05/07/16 1944 05/08/16 0405  WBC 22.4 Repeated and verified X2.* 23.9* 21.2*  NEUTROABS  --  21.7*  --   HGB 10.2* 9.3* 8.4*  HCT 31.1* 28.4* 25.5*  MCV 89.3 88.5 88.9  PLT 251.0 322 290   Cardiac Enzymes:    Recent Labs Lab 05/08/16 0405 05/08/16 1047 05/08/16 1434  TROPONINI <0.03 0.03* 0.03*   BNP (last 3 results) No results for input(s): BNP in the last 8760 hours.  ProBNP (last 3 results) No results for input(s): PROBNP in the last 8760 hours.  CBG: No results for input(s): GLUCAP in the last 168 hours.  Recent Results (from the past 240 hour(s))  Blood culture (routine x 2)     Status: None (Preliminary result)   Collection Time: 05/07/16  5:01 PM  Result Value Ref Range Status   Specimen Description BLOOD RIGHT ARM  Final   Special Requests BOTTLES DRAWN AEROBIC AND ANAEROBIC 5CC  Final   Culture   Final    NO GROWTH < 24 HOURS Performed at [facility]    Report Status PENDING  Incomplete  MRSA PCR Screening     Status: None   Collection Time: 05/08/16  2:40 AM  Result Value Ref Range Status   MRSA by PCR NEGATIVE NEGATIVE Final    Comment:        The GeneXpert MRSA Assay (FDA approved for NASAL specimens only), is one component of a comprehensive MRSA colonization surveillance program. It is not intended to diagnose MRSA infection nor to guide or monitor treatment for MRSA infections.   Culture, blood (Routine X 2) w Reflex to ID Panel     Status: None (Preliminary result)   Collection Time: 05/08/16  4:05 AM  Result Value Ref Range Status   Specimen Description BLOOD LEFT ARM  Final   Special Requests BOTTLES DRAWN AEROBIC AND ANAEROBIC 5 CC  Final   Culture   Final    NO GROWTH < 12 HOURS Performed at [facility]    Report Status PENDING  Incomplete     Studies: No results found.  Scheduled Meds: . aspirin EC  81 mg Oral Q1200  . atorvastatin  20 mg Oral q1800  . cefTRIAXone (ROCEPHIN)  IV  1 g  Intravenous Q24H  . enoxaparin (LOVENOX) injection  30 mg Subcutaneous Q24H  . feeding supplement (NEPRO CARB STEADY)  237 mL Oral BID BM  . metoprolol tartrate  12.5 mg Oral BID  . pantoprazole  40 mg Oral Daily  . sodium bicarbonate  650 mg Oral QID  . sodium chloride flush  3 mL Intravenous Q12H    Continuous Infusions:    Time spent: 50mins  Maggie Dworkin MD, PhD  [facility] Pager [phone]. If 7PM-7AM, please contact night-coverage at [email or web address], password [facility] 05/09/2016, 8:33 AM  LOS: 1 day

## 2016-05-09 NOTE — Progress Notes (Addendum)
Pt's wife requests an update. Dr. Erlinda Hong paged through [facility]. Pt's wife's name is [name] and can be reached at [phone].

## 2016-05-09 NOTE — Evaluation (Signed)
Physical Therapy Evaluation Patient Details Name: Joseph Carrillo MRN: [id number] DOB: 06/01/1927 Today's Date: 05/09/2016   History of Present Illness  82 y.o. male admitted with fatigue and abnormal blood work. Dx of hydroureteronephrosis, ARF, hyperkalemia, UTI.  PMH obstructive uropathy, urostomy, colostomy, prostate cancer, CAD, CABG, GERD.   Clinical Impression  Pt admitted with above diagnosis. Pt currently with functional limitations due to the deficits listed below (see PT Problem List). Pt ambulated 21' with RW, HR 128, distance limited by fatigue.  Pt will benefit from skilled PT to increase their independence and safety with mobility to allow discharge to the venue listed below.       Follow Up Recommendations Home health PT    Equipment Recommendations  None recommended by PT    Recommendations for Other Services       Precautions / Restrictions Precautions Precautions: Fall Precaution Comments: multiple drains Restrictions Weight Bearing Restrictions: No      Mobility  Bed Mobility Overal bed mobility: Needs Assistance Bed Mobility: Supine to Sit;Sit to Supine     Supine to sit: Mod assist Sit to supine: Min assist   General bed mobility comments: Mod A to raise trunk and pivot hips, pt able to scoot to EOB independently, min A for BLEs into bed  Transfers Overall transfer level: Needs assistance Equipment used: Rolling walker (2 wheeled) Transfers: Sit to/from Stand Sit to Stand: Min assist         General transfer comment: min A to rise, VCs hand placement  Ambulation/Gait Ambulation/Gait assistance: Min guard Ambulation Distance (Feet): 75 Feet Assistive device: Rolling walker (2 wheeled) Gait Pattern/deviations: Step-through pattern;Decreased step length - left;Decreased step length - right;Trunk flexed   Gait velocity interpretation: Below normal speed for age/gender General Gait Details: significant forward head which pt can only minimally  correct with verbal cues, flexed trunk, distance limited by fatigue, HR 128 walking, no LOB  Stairs            Wheelchair Mobility    Modified Rankin (Stroke Patients Only)       Balance Overall balance assessment: Needs assistance   Sitting balance-Leahy Scale: Fair       Standing balance-Leahy Scale: Poor                               Pertinent Vitals/Pain Pain Assessment: No/denies pain    Home Living Family/patient expects to be discharged to:: Private residence Living Arrangements: Children (with daughter [name]) Available Help at Discharge: Family;Available 24 hours/day Type of Home: House       Home Layout: One level Home Equipment: [place] - 2 wheels;Cane - single point      Prior Function Level of Independence: Independent with assistive device(s)         Comments: Uses cane and RW     Hand Dominance        Extremity/Trunk Assessment   Upper Extremity Assessment: Defer to OT evaluation           Lower Extremity Assessment: Overall WFL for tasks assessed      Cervical / Trunk Assessment: Kyphotic (forward flexed head)  Communication   Communication: No difficulties  Cognition Arousal/Alertness: Awake/alert Behavior During Therapy: WFL for tasks assessed/performed Overall Cognitive Status: Within Functional Limits for tasks assessed                      General Comments  Exercises     Assessment/Plan    PT Assessment Patient needs continued PT services  PT Problem List Decreased activity tolerance;Decreased balance;Decreased mobility;Decreased knowledge of use of DME          PT Treatment Interventions Gait training;Functional mobility training;Therapeutic exercise;Balance training;Patient/family education    PT Goals (Current goals can be found in the Care Plan section)  Acute Rehab PT Goals Patient Stated Goal: return home PT Goal Formulation: With patient Time For Goal Achievement:  05/23/16 Potential to Achieve Goals: Good    Frequency Min 3X/week   Barriers to discharge        Co-evaluation PT/OT/SLP Co-Evaluation/Treatment: Yes Reason for Co-Treatment: For patient/therapist safety;Complexity of the patient's impairments (multi-system involvement) PT goals addressed during session: Mobility/safety with mobility;Proper use of DME;Balance         End of Session Equipment Utilized During Treatment: Gait belt Activity Tolerance: Patient limited by fatigue Patient left: in bed Nurse Communication: Mobility status         Time: 1236-1300 PT Time Calculation (min) (ACUTE ONLY): 24 min   Charges:   PT Evaluation $PT Eval Low Complexity: 1 Procedure     PT G CodesBlondell Reveal Kistler 05/09/2016, 1:24 PM [phone]

## 2016-05-10 ENCOUNTER — Inpatient Hospital Stay (HOSPITAL_COMMUNITY): Payer: Medicare Other

## 2016-05-10 LAB — CBC
HCT: 25.8 % — ABNORMAL LOW (ref 39.0–52.0)
Hemoglobin: 8.4 g/dL — ABNORMAL LOW (ref 13.0–17.0)
MCH: 28.7 pg (ref 26.0–34.0)
MCHC: 32.6 g/dL (ref 30.0–36.0)
MCV: 88.1 fL (ref 78.0–100.0)
Platelets: 308 10*3/uL (ref 150–400)
RBC: 2.93 MIL/uL — ABNORMAL LOW (ref 4.22–5.81)
RDW: 16.4 % — AB (ref 11.5–15.5)
WBC: 12.3 10*3/uL — ABNORMAL HIGH (ref 4.0–10.5)

## 2016-05-10 LAB — VITAMIN B12: Vitamin B-12: 478 pg/mL (ref 180–914)

## 2016-05-10 LAB — RETICULOCYTES
RBC.: 2.93 MIL/uL — AB (ref 4.22–5.81)
Retic Count, Absolute: 55.7 10*3/uL (ref 19.0–186.0)
Retic Ct Pct: 1.9 % (ref 0.4–3.1)

## 2016-05-10 LAB — TYPE AND SCREEN
ABO/RH(D): B POS
ANTIBODY SCREEN: NEGATIVE

## 2016-05-10 LAB — COMPREHENSIVE METABOLIC PANEL
ALBUMIN: 2.2 g/dL — AB (ref 3.5–5.0)
ALT: 35 U/L (ref 17–63)
ANION GAP: 5 (ref 5–15)
AST: 29 U/L (ref 15–41)
Alkaline Phosphatase: 95 U/L (ref 38–126)
BILIRUBIN TOTAL: 0.4 mg/dL (ref 0.3–1.2)
BUN: 57 mg/dL — ABNORMAL HIGH (ref 6–20)
CO2: 19 mmol/L — AB (ref 22–32)
Calcium: 8.6 mg/dL — ABNORMAL LOW (ref 8.9–10.3)
Chloride: 113 mmol/L — ABNORMAL HIGH (ref 101–111)
Creatinine, Ser: 2.59 mg/dL — ABNORMAL HIGH (ref 0.61–1.24)
GFR calc Af Amer: 24 mL/min — ABNORMAL LOW (ref >60)
GFR calc non Af Amer: 21 mL/min — ABNORMAL LOW (ref >60)
GLUCOSE: 103 mg/dL — AB (ref 65–99)
POTASSIUM: 5.1 mmol/L (ref 3.5–5.1)
SODIUM: 137 mmol/L (ref 135–145)
Total Protein: 5.8 g/dL — ABNORMAL LOW (ref 6.5–8.1)

## 2016-05-10 LAB — MAGNESIUM: Magnesium: 2.1 mg/dL (ref 1.7–2.4)

## 2016-05-10 LAB — IRON AND TIBC
IRON: 11 ug/dL — AB (ref 45–182)
Saturation Ratios: 10 % — ABNORMAL LOW (ref 17.9–39.5)
TIBC: 108 ug/dL — ABNORMAL LOW (ref 250–450)
UIBC: 97 ug/dL

## 2016-05-10 LAB — PROTIME-INR
INR: 1.26
Prothrombin Time: 15.8 seconds — ABNORMAL HIGH (ref 11.4–15.2)

## 2016-05-10 LAB — ABO/RH: ABO/RH(D): B POS

## 2016-05-10 LAB — FOLATE: Folate: 9 ng/mL (ref >5.9)

## 2016-05-10 MED ORDER — HYDROCODONE-ACETAMINOPHEN 5-325 MG PO TABS
1.0000 | ORAL_TABLET | Freq: Four times a day (QID) | ORAL | Status: DC | PRN
  Administered 2016-05-11 – 2016-05-14 (×7): 1 via ORAL
  Filled 2016-05-10 (×7): qty 1

## 2016-05-10 MED ORDER — IOTHALAMATE MEGLUMINE 17.2 % UR SOLN
100.0000 mL | Freq: Once | URETHRAL | Status: AC | PRN
  Administered 2016-05-10: 100 mL via INTRAVESICAL

## 2016-05-10 NOTE — Progress Notes (Signed)
Patient ID: Joseph Carrillo, male   DOB: 1927-07-16, 82 y.o.   MRN: [id number] Pt's left nephrostomy irrigates well; output today over 200 cc; cystogram today shows large fistulous communication between posterior bladder and rectum with retrograde filling of sigmoid colon. Would only inject left PCN if no sig output. SP bag with foul smelling fluid/liquid stool.

## 2016-05-10 NOTE — Progress Notes (Signed)
Faxed on 10/17 to [facility].

## 2016-05-10 NOTE — Consult Note (Signed)
Chief Complaint:  Urosepsis with colovesical fistula with prior diverting colostomy  History of Present Illness:  Joseph Carrillo is an 82 y.o. male who has had prostate cancer with radiation and with a supapubic tube, bilateral nephrostomy tubes, a diverting colostomy, and a colovesical fistula.  He has a creatinine above 3 and is deconditioned by age and disease.    Past Medical History:  Diagnosis Date  . Blood in stool   . Blood transfusion without reported diagnosis   . CAD (coronary artery disease)    a. 2000 - Says he had CABG x 3 or 4.  . Chicken pox   . Colon polyp   . Elevated blood pressure reading   . Glaucoma   . Heart murmur   . Hx: UTI (urinary tract infection)   . Hyperlipidemia   . Prostate cancer ([place])   . Valvular heart disease    a. 2005 s/p Valve replacement - presumably bioprosthetic (not on anticoagulation).  He doesn't know which valve.    Past Surgical History:  Procedure Laterality Date  . CORONARY ARTERY BYPASS GRAFT    . IR GENERIC HISTORICAL  04/02/2016   IR NEPHROSTOMY PLACEMENT LEFT 04/02/2016 Sandi Mariscal, MD [facility]  . IR GENERIC HISTORICAL  04/02/2016   IR NEPHROSTOMY PLACEMENT RIGHT 04/02/2016 Sandi Mariscal, MD [facility]  . LUNG SURGERY    . PROSTATE SURGERY      Current Facility-Administered Medications  Medication Dose Route Frequency Provider Last Rate Last Dose  . acetaminophen (TYLENOL) tablet 650 mg  650 mg Oral Q6H PRN Toy Baker, MD   650 mg at 05/10/16 1039   Or  . acetaminophen (TYLENOL) suppository 650 mg  650 mg Rectal Q6H PRN Toy Baker, MD      . albuterol (PROVENTIL) (2.5 MG/3ML) 0.083% nebulizer solution 2.5 mg  2.5 mg Nebulization Q6H PRN Toy Baker, MD      . aspirin EC tablet 81 mg  81 mg Oral Q1200 Toy Baker, MD   81 mg at 05/10/16 1328  . atorvastatin (LIPITOR) tablet 20 mg  20 mg Oral q1800 Toy Baker, MD   20 mg at 05/09/16 1837  . cefTRIAXone (ROCEPHIN) 1 g in dextrose 5 % 50 mL  IVPB  1 g Intravenous Q24H Toy Baker, MD   1 g at 05/09/16 2100  . enoxaparin (LOVENOX) injection 30 mg  30 mg Subcutaneous Q24H Toy Baker, MD   30 mg at 05/10/16 0945  . famotidine (PEPCID) tablet 20 mg  20 mg Oral QHS Florencia Reasons, MD   20 mg at 05/09/16 2128  . feeding supplement (NEPRO CARB STEADY) liquid 237 mL  237 mL Oral BID BM Nita Sells, MD   237 mL at 05/10/16 0957  . HYDROcodone-acetaminophen (NORCO/VICODIN) 5-325 MG per tablet 1 tablet  1 tablet Oral Q6H PRN Florencia Reasons, MD      . lactobacillus (FLORANEX/LACTINEX) granules 1 g  1 g Oral TID WC Florencia Reasons, MD   1 g at 05/10/16 1328  . metoprolol tartrate (LOPRESSOR) tablet 12.5 mg  12.5 mg Oral BID Nita Sells, MD   12.5 mg at 05/10/16 0945  . ondansetron (ZOFRAN) tablet 4 mg  4 mg Oral Q6H PRN Toy Baker, MD       Or  . ondansetron (ZOFRAN) injection 4 mg  4 mg Intravenous Q6H PRN Toy Baker, MD      . sodium bicarbonate tablet 650 mg  650 mg Oral QID Toy Baker, MD   650 mg  at 05/10/16 1329  . sodium chloride flush (NS) 0.9 % injection 3 mL  3 mL Intravenous Q12H Toy Baker, MD   3 mL at 05/09/16 1610   Review of patient's allergies indicates no known allergies. Family History  Problem Relation Age of Onset  . Heart disease Father     Pt says his brother had heart infection and died suddenly.   Social History:   reports that he quit smoking about 44 years ago. He has never used smokeless tobacco. He reports that he does not drink alcohol or use drugs.   REVIEW OF SYSTEMS : Negative except for see problem list  Physical Exam:   Blood pressure (!) 138/57, pulse 85, temperature 98.9 F (37.2 C), temperature source Oral, resp. rate 14, height 5' 8.5" (1.74 m), weight 49.2 kg (108 lb 8 oz), SpO2 100 %. Body mass index is 16.26 kg/m.  Gen:  Elderly AAM NAD  Neurological: Alert and oriented to person, place, and time. Motor and sensory function is grossly intact  Head:  Normocephalic and atraumatic.  Neck: Normal range of motion. Neck supple. No tracheal deviation or thyromegaly present.  Cardiovascular:  SR without murmurs or gallops.  No carotid bruits Abdomen:  Flat and nontender with functioning colostomy in the right upper quadrant and a suprapubic tube in place  LABORATORY RESULTS: Results for orders placed or performed during the hospital encounter of 05/07/16 (from the past 48 hour(s))  C difficile quick scan w PCR reflex     Status: None   Collection Time: 05/09/16 10:02 AM  Result Value Ref Range   C Diff antigen NEGATIVE NEGATIVE   C Diff toxin NEGATIVE NEGATIVE   C Diff interpretation No C. difficile detected.   Gastrointestinal Panel by PCR , Stool     Status: None   Collection Time: 05/09/16 10:02 AM  Result Value Ref Range   Campylobacter species NOT DETECTED NOT DETECTED   Plesimonas shigelloides NOT DETECTED NOT DETECTED   Salmonella species NOT DETECTED NOT DETECTED   Yersinia enterocolitica NOT DETECTED NOT DETECTED   Vibrio species NOT DETECTED NOT DETECTED   Vibrio cholerae NOT DETECTED NOT DETECTED   Enteroaggregative E coli (EAEC) NOT DETECTED NOT DETECTED   Enteropathogenic E coli (EPEC) NOT DETECTED NOT DETECTED   Enterotoxigenic E coli (ETEC) NOT DETECTED NOT DETECTED   Shiga like toxin producing E coli (STEC) NOT DETECTED NOT DETECTED   Shigella/Enteroinvasive E coli (EIEC) NOT DETECTED NOT DETECTED   Cryptosporidium NOT DETECTED NOT DETECTED   Cyclospora cayetanensis NOT DETECTED NOT DETECTED   Entamoeba histolytica NOT DETECTED NOT DETECTED   Giardia lamblia NOT DETECTED NOT DETECTED   Adenovirus F40/41 NOT DETECTED NOT DETECTED   Astrovirus NOT DETECTED NOT DETECTED   Norovirus GI/GII NOT DETECTED NOT DETECTED   Rotavirus A NOT DETECTED NOT DETECTED   Sapovirus (I, II, IV, and V) NOT DETECTED NOT DETECTED  Culture, Urine     Status: None (Preliminary result)   Collection Time: 05/09/16 10:30 AM  Result Value Ref  Range   Specimen Description URINE, SUPRAPUBIC    Special Requests NONE    Culture      CULTURE REINCUBATED FOR BETTER GROWTH Performed at [facility]    Report Status PENDING   Culture, Urine     Status: None (Preliminary result)   Collection Time: 05/09/16 10:31 AM  Result Value Ref Range   Specimen Description KIDNEY RIGHT NEPHROSTOMY    Special Requests NONE    Culture  CULTURE REINCUBATED FOR BETTER GROWTH Performed at [facility]    Report Status PENDING   Culture, Urine     Status: None (Preliminary result)   Collection Time: 05/09/16 10:32 AM  Result Value Ref Range   Specimen Description KIDNEY LEFT NEPHROSTOMY    Special Requests NONE    Culture      CULTURE REINCUBATED FOR BETTER GROWTH Performed at [facility]    Report Status PENDING   CBC     Status: Abnormal   Collection Time: 05/10/16  3:47 AM  Result Value Ref Range   WBC 12.3 (H) 4.0 - 10.5 K/uL   RBC 2.93 (L) 4.22 - 5.81 MIL/uL   Hemoglobin 8.4 (L) 13.0 - 17.0 g/dL   HCT 25.8 (L) 39.0 - 52.0 %   MCV 88.1 78.0 - 100.0 fL   MCH 28.7 26.0 - 34.0 pg   MCHC 32.6 30.0 - 36.0 g/dL   RDW 16.4 (H) 11.5 - 15.5 %   Platelets 308 150 - 400 K/uL  Comprehensive metabolic panel     Status: Abnormal   Collection Time: 05/10/16  3:47 AM  Result Value Ref Range   Sodium 137 135 - 145 mmol/L   Potassium 5.1 3.5 - 5.1 mmol/L   Chloride 113 (H) 101 - 111 mmol/L   CO2 19 (L) 22 - 32 mmol/L   Glucose, Bld 103 (H) 65 - 99 mg/dL   BUN 57 (H) 6 - 20 mg/dL   Creatinine, Ser 2.59 (H) 0.61 - 1.24 mg/dL   Calcium 8.6 (L) 8.9 - 10.3 mg/dL   Total Protein 5.8 (L) 6.5 - 8.1 g/dL   Albumin 2.2 (L) 3.5 - 5.0 g/dL   AST 29 15 - 41 U/L   ALT 35 17 - 63 U/L   Alkaline Phosphatase 95 38 - 126 U/L   Total Bilirubin 0.4 0.3 - 1.2 mg/dL   GFR calc non Af Amer 21 (L) >60 mL/min   GFR calc Af Amer 24 (L) >60 mL/min    Comment: (NOTE) The eGFR has been calculated using the CKD EPI equation. This  calculation has not been validated in all clinical situations. eGFR's persistently <60 mL/min signify possible Chronic Kidney Disease.    Anion gap 5 5 - 15  Magnesium     Status: None   Collection Time: 05/10/16  3:47 AM  Result Value Ref Range   Magnesium 2.1 1.7 - 2.4 mg/dL  Protime-INR     Status: Abnormal   Collection Time: 05/10/16  3:47 AM  Result Value Ref Range   Prothrombin Time 15.8 (H) 11.4 - 15.2 seconds   INR 1.26   Iron and TIBC     Status: Abnormal   Collection Time: 05/10/16  3:47 AM  Result Value Ref Range   Iron 11 (L) 45 - 182 ug/dL   TIBC 108 (L) 250 - 450 ug/dL   Saturation Ratios 10 (L) 17.9 - 39.5 %   UIBC 97 ug/dL    Comment: Performed at [facility]  Vitamin B12     Status: None   Collection Time: 05/10/16  3:47 AM  Result Value Ref Range   Vitamin B-12 478 180 - 914 pg/mL    Comment: (NOTE) This assay is not validated for testing neonatal or myeloproliferative syndrome specimens for Vitamin B12 levels. Performed at [facility]   Folate     Status: None   Collection Time: 05/10/16  3:47 AM  Result Value Ref Range  Folate 9.0 >5.9 ng/mL    Comment: Performed at [facility]  Reticulocytes     Status: Abnormal   Collection Time: 05/10/16  3:47 AM  Result Value Ref Range   Retic Ct Pct 1.9 0.4 - 3.1 %   RBC. 2.93 (L) 4.22 - 5.81 MIL/uL   Retic Count, Manual 55.7 19.0 - 186.0 K/uL  Type and screen [place]     Status: None   Collection Time: 05/10/16  3:47 AM  Result Value Ref Range   ABO/RH(D) B POS    Antibody Screen NEG    Sample Expiration 05/13/2016   ABO/Rh     Status: None   Collection Time: 05/10/16  3:47 AM  Result Value Ref Range   ABO/RH(D) B POS      RADIOLOGY RESULTS: Dg Cystogram  Result Date: 05/10/2016 CLINICAL DATA:  Foul smelling head drainage from the urinary bladder, clinical concern for colovesical fistula EXAM: CYSTOGRAM TECHNIQUE: After catheterization of the urinary  bladder following sterile technique the bladder was filled with 120 mL Cysto-Hypaque 30% by drip infusion. Serial spot images were obtained during bladder filling and post draining. FLUOROSCOPY TIME:  Fluoroscopy Time:  2 minutes, 36 seconds Radiation Exposure Index (if provided by the fluoroscopic device): 14.3 Number of Acquired Spot Images: 0 COMPARISON:  05/08/2016 FINDINGS: Initial fluoroscopy over the abdomen demonstrates bilateral nephrostomy tubes along with lumbar spondylosis. I injected the urinary bladder through the super pubic catheter. The urinary bladder is irregular inferiorly were there is a large filling defect. There is prompt posterior drainage into the rectum. The whole posterior portion of the urinary bladder is irregular. With turning of the patient under fluoroscopic observation, I believe that the fistulous connection to the rectum is directly posterior to the urinary bladder in the midline. We are able to retrograde fill the distal sigmoid colon through injection of the suprapubic catheter. IMPRESSION: 1. 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 This caused retrograde filling of the sigmoid colon as I injected the urinary bladder. 2. Filling defect along the bottom of the urinary bladder is nonspecific. This might be from an enlarged prostate gland but blood clot or tumor is not excluded. 3. The patient had difficulty turning due to infirmity, which limited our exam and our ability to obtain images at different angles. Electronically Signed   By: Van Clines M.D.   On: 05/10/2016 13:16    Problem List: Patient Active Problem List   Diagnosis Date Noted  . Acute on chronic renal failure ([place]) 05/08/2016  . Acute renal failure (ARF) ([place]) 05/08/2016  . Hydronephrosis, bilateral   . Protein-calorie malnutrition, severe 03/30/2016  . Renal failure (ARF), acute on chronic (HCC)   . Hyperkalemia 03/28/2016  . CKD (chronic kidney  disease) 03/28/2016  . Suprapubic catheter ([place]) 03/28/2016  . UTI (urinary tract infection) 03/28/2016  . Leukocytosis 03/28/2016  . Anemia 03/28/2016  . History of urostomy 03/22/2016  . Colostomy in place [facility]) 03/22/2016  . Underweight 03/22/2016  . Prostate cancer ([place]) 03/22/2016  . Abdominal pain, chronic, epigastric 03/22/2016  . Physical debility 03/22/2016    Assessment & Plan: Since he has been diverted, I would think that the only means of palliative care would be through the use of irrigants through the suprapubic tube to lower the bacterial counts in the bladder with drainage through the rectum.      Matt B. Hassell Done, MD, [facility] [phone] beeper  [phone]  05/10/2016 4:38 PM

## 2016-05-10 NOTE — Progress Notes (Signed)
Physical Therapy Treatment Patient Details Name: Joseph Carrillo MRN: [id number] DOB: [birth year] Today's Date: 05/10/2016    History of Present Illness 82 y.o. male admitted with fatigue and abnormal blood work. Dx of hydroureteronephrosis, ARF, hyperkalemia, UTI.  PMH obstructive uropathy, urostomy, colostomy, prostate cancer, CAD, CABG, GERD.     PT Comments    Assisted pt OOB to amb + 2 assist for safety.  Tolerated well.  C/O Rectum pain (discomfort) pre medicated.    Follow Up Recommendations  Home health PT     Equipment Recommendations  None recommended by PT    Recommendations for Other Services       Precautions / Restrictions      Mobility  Bed Mobility Overal bed mobility: Needs Assistance Bed Mobility: Supine to Sit     Supine to sit: Min guard;Min assist     General bed mobility comments: "dont pull on me" pt stated "it pulls my drains".  with increased time, pt was able to self scoot to EOB  Transfers Overall transfer level: Needs assistance Equipment used: Rolling walker (2 wheeled) Transfers: Sit to/from Stand Sit to Stand: +2 safety/equipment;Min guard         General transfer comment: + 2 assist to watch multiple lines/drains/tube then 25% VC's to reach back prior to sit to control decend  Ambulation/Gait Ambulation/Gait assistance: Min guard;Min assist;+2 safety/equipment Ambulation Distance (Feet): 95 Feet Assistive device: Rolling walker (2 wheeled) Gait Pattern/deviations: Step-to pattern;Step-through pattern;Decreased step length - right;Decreased step length - left;Trunk flexed;Narrow base of support Gait velocity: decreased   General Gait Details: + 2 for equipment and multiple lines/drains.  Tolerated increased distance.  Slow but steady.  < 25% assist to maneuver walker around obsticles.  Mod c/o fatigue.     Stairs            Wheelchair Mobility    Modified Rankin (Stroke Patients Only)       Balance                                     Cognition Arousal/Alertness: Awake/alert Behavior During Therapy: WFL for tasks assessed/performed Overall Cognitive Status: Within Functional Limits for tasks assessed                      Exercises      General Comments        Pertinent Vitals/Pain Pain Assessment: Faces Faces Pain Scale: Hurts even more Pain Location: rectum Pain Descriptors / Indicators: Burning;Grimacing Pain Intervention(s): Monitored during session;Repositioned    Home Living                      Prior Function            PT Goals (current goals can now be found in the care plan section) Progress towards PT goals: Progressing toward goals    Frequency    Min 3X/week      PT Plan Current plan remains appropriate    Co-evaluation             End of Session Equipment Utilized During Treatment: Gait belt Activity Tolerance: Patient tolerated treatment well Patient left: in chair;with family/visitor present;with call bell/phone within reach     Time: 1142-1208 PT Time Calculation (min) (ACUTE ONLY): 26 min  Charges:  $Gait Training: 8-22 mins $Therapeutic Activity: 8-22 mins  G Codes:      Rica Koyanagi  PTA [facility]  Acute  Rehab Pager      [phone]

## 2016-05-10 NOTE — Progress Notes (Addendum)
PROGRESS NOTE  Joseph Carrillo [id number] DOB: 10-23-1926 DOA: 05/07/2016 PCP: [name], MD  HPI/Recap of past 24 hours:  Patient sinus rhythm with occasional pac's, no more svt episodes, bp stable  Wbc trending down, no fever, but sp drain remain purulent and odorous which reportedly has been a problem for several months  Bilateral nephrostomy tubes also in place with clear drainage, colostomy with liquid stool Report no appetite and continue to loose weight  Assessment/Plan: Active Problems:   History of urostomy   Colostomy in place [facility])   Prostate cancer (HCC)   Hyperkalemia   UTI (urinary tract infection)   Leukocytosis   Anemia   Protein-calorie malnutrition, severe   Acute on chronic renal failure (HCC)   Acute renal failure (ARF) ([place])  82 y.o. 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 Leukocytosis  from dehydration and possible UTI, patient also has watery stool in colostomy bag, foul smelling urine vs stool.  Ct chest no penumomediastinum, no pna,  CT abdomen showed bilateral nephrostomy tubes, left sided hydrouretonephrosis and some mobile stone in the bladder Blood culture pending, urine culture (obtained after getting abx) from bilateral nephrostomy tube and suprapubic catheter pending. mrsa screen negative, stool pcr panel negative, c diff negative Currently on rocephin, start on probiotics, d/c ppi, start pepcid due to watery stool  Obstructive uropathy: s/p bilateral nephrostomy tube placement from prior hospitalization, ct ab showed persistent moderate left sided hydroureteronephrosis, urology and IR consulted   UTI (urinary tract infection)? patient has recurrent UTIs versus colonization (h/o  enterovesicular fistula), s/p colostomy and suprapubic catherter . Given leukocytosis, cr elevation, abx Rocephin started from admission, await results of urine culture Suprapubic catheter drainage remain purulent and malodorous, I talked to interventional radiology Dr Annamaria Boots who recommended fistulogram to evaluate enterovesicular fistula, may need general surgery consult  3;30pm Addendum : Cystogram showed: large fistulous communication between posterior bladder and rectum with retrograde filling of sigmoid colon. General surgery paged for consult.  . Acute on chronic renal failure (HCC) (unclear baseline cr, patient report required dialysis at some point in the past).   continue hydration,defer  ir and urology for obstructive uropathy management, treat possible uti?    . SVT broked with adenosine, now restarted back on betablocker which was held due to initial Bradycardia , continue hold clonidine,  monitor on telemetry, echo lvef wnl, grade II diastolic dysfunction, no wall motion abmormalities tsh 0.6,  Mag 1.4, replace mag  . Hyperkalemia mild,, metabolic acidosis,  improving, continue bicarb supplement  . Prostate cancer [facility]) status post resection patient endorsing pelvic pain, CTab pel, ct chest did not report metastatic spread, psa 0.  . Protein-calorie malnutrition, severe , Prealbumin 8.4, nutrition consult Body mass index is 16.26 kg/m.   .. Anemia chronic : anemia work up pending, monitor, transfuse prn to keep hgb >7  H/o CAD s/p cabg in 2000, h/o valvular heart disease s/p bioprosthetic valve replacement in 2005, unknown detail; no chest pain, continue betablocker, asa, statin Echo lvef wnl, grade II diastolic dysfunction, no wall motion abmormalities He was recently evaluated by cardiology at [facility], no recommendation of ischemic work up  a dominant noncalcified nodule in the left lower lobe measuring 10 x 7 mm, mean 8.5 mm. Non-contrast  chest CT at 3-6 months is  recommended   FTT: patient has multiple hospitalization since august this year, with progressive weakness, will need snf placement  DVT prophylaxis:    Lovenox 30 mg sub Q    Code Status:  FULL CODE   as per patient    Family Communication:   patient , daughter and son in room, patient is alert and oriented, he reports daughter is the contact person, he report his wife ( daughter's stepmother) is not in good health I have requested health care POA paper work,  Wife mary Mandigo called at [phone]. Wife states she is the contact person    Disposition Plan:   SNF                                                 Consultants:  Urology  IR  Procedures:  none  Antibiotics:  rocephin   Objective: BP (!) 121/46   Pulse (!) 45   Temp 98 F (36.7 C) (Axillary)   Resp 12   Ht 5' 8.5" (1.74 m)   Wt 49.2 kg (108 lb 8 oz)   SpO2 100%   BMI 16.26 kg/m   Intake/Output Summary (Last 24 hours) at 05/10/16 0813 Last data filed at 05/10/16 0553  Gross per 24 hour  Intake          2320.75 ml  Output             1720 ml  Net           600.75 ml   Filed Weights   05/07/16 1722 05/08/16 0231  Weight: 49 kg (108 lb) 49.2 kg (108 lb 8 oz)    Exam:   General:  Frail , thin, oriented x3  Cardiovascular: RRR  Respiratory: CTABL  Abdomen: + watery stool in colostomy bag, + suprapubic catheter with purulent drainage, + bilateral nephrostomy tubes, generalized mild discomfort lower abdomen, suprapubic region, no guarding, no rebound, positive BS  Musculoskeletal: No Edema  Neuro: aaox3  Data Reviewed: Basic Metabolic Panel:  Recent Labs Lab 05/07/16 1227 05/07/16 1944 05/08/16 0405 05/10/16 0347  NA 133* 133* 133* 137  K 5.3* 5.3* 5.1 5.1  CL 101 103 106 113*  CO2 17* 20* 19* 19*  GLUCOSE 145* 126* 161* 103*  BUN 63* 64* 64* 57*  CREATININE 3.19* 3.13* 3.06* 2.59*  CALCIUM 8.9 9.1 8.7* 8.6*  MG  --   --  1.4* 2.1  PHOS  --   --  4.8*  --    Liver  Function Tests:  Recent Labs Lab 05/07/16 1227 05/07/16 1944 05/08/16 0405 05/10/16 0347  AST 76* 73* 54* 29  ALT 71* 75* 57 35  ALKPHOS 122* 125 112 95  BILITOT 0.5 0.7 0.5 0.4  PROT 6.7 7.3 6.3* 5.8*  ALBUMIN 3.0* 2.7* 2.5* 2.2*    Recent Labs Lab 05/07/16 1944  LIPASE 27   No results for input(s): AMMONIA in the last 168 hours. CBC:  Recent Labs Lab 05/07/16 1227 05/07/16 1944 05/08/16 0405 05/10/16 0347  WBC 22.4 Repeated and verified X2.* 23.9* 21.2* 12.3*  NEUTROABS  --  21.7*  --   --   HGB 10.2* 9.3* 8.4* 8.4*  HCT 31.1* 28.4* 25.5* 25.8*  MCV 89.3 88.5 88.9 88.1  PLT 251.0 322 290 308   Cardiac Enzymes:  Recent Labs Lab 05/08/16 0405 05/08/16 1047 05/08/16 1434  TROPONINI <0.03 0.03* 0.03*   BNP (last 3 results) No results for input(s): BNP in the last 8760 hours.  ProBNP (last 3 results) No results for input(s): PROBNP in the last 8760 hours.  CBG: No results for input(s): GLUCAP in the last 168 hours.  Recent Results (from the past 240 hour(s))  Blood culture (routine x 2)     Status: None (Preliminary result)   Collection Time: 05/07/16  5:01 PM  Result Value Ref Range Status   Specimen Description BLOOD RIGHT ARM  Final   Special Requests BOTTLES DRAWN AEROBIC AND ANAEROBIC 5CC  Final   Culture   Final    NO GROWTH 2 DAYS Performed at [facility]    Report Status PENDING  Incomplete  MRSA PCR Screening     Status: None   Collection Time: 05/08/16  2:40 AM  Result Value Ref Range Status   MRSA by PCR NEGATIVE NEGATIVE Final    Comment:        The GeneXpert MRSA Assay (FDA approved for NASAL specimens only), is one component of a comprehensive MRSA colonization surveillance program. It is not intended to diagnose MRSA infection nor to guide or monitor treatment for MRSA infections.   Culture, blood (Routine X 2) w Reflex to ID Panel     Status: None (Preliminary result)   Collection Time: 05/08/16  4:05 AM  Result  Value Ref Range Status   Specimen Description BLOOD LEFT ARM  Final   Special Requests BOTTLES DRAWN AEROBIC AND ANAEROBIC 5 CC  Final   Culture   Final    NO GROWTH 1 DAY Performed at [facility]    Report Status PENDING  Incomplete  C difficile quick scan w PCR reflex     Status: None   Collection Time: 05/09/16 10:02 AM  Result Value Ref Range Status   C Diff antigen NEGATIVE NEGATIVE Final   C Diff toxin NEGATIVE NEGATIVE Final   C Diff interpretation No C. difficile detected.  Final  Gastrointestinal Panel by PCR , Stool     Status: None   Collection Time: 05/09/16 10:02 AM  Result Value Ref Range Status   Campylobacter species NOT DETECTED NOT DETECTED Final   Plesimonas shigelloides NOT DETECTED NOT DETECTED Final   Salmonella species NOT DETECTED NOT DETECTED Final   Yersinia enterocolitica NOT DETECTED NOT DETECTED Final   Vibrio species NOT DETECTED NOT DETECTED Final   Vibrio cholerae NOT DETECTED NOT DETECTED Final   Enteroaggregative E coli (EAEC) NOT DETECTED NOT DETECTED Final   Enteropathogenic E coli (EPEC) NOT DETECTED NOT DETECTED Final   Enterotoxigenic E coli (ETEC) NOT DETECTED NOT DETECTED Final   Shiga like toxin producing E coli (STEC) NOT DETECTED NOT DETECTED Final   Shigella/Enteroinvasive E coli (EIEC) NOT DETECTED NOT DETECTED Final   Cryptosporidium NOT DETECTED NOT DETECTED Final   Cyclospora cayetanensis NOT DETECTED NOT DETECTED Final   Entamoeba histolytica NOT DETECTED NOT DETECTED Final   Giardia lamblia NOT DETECTED NOT DETECTED Final   Adenovirus F40/41 NOT DETECTED NOT DETECTED Final   Astrovirus NOT DETECTED NOT DETECTED Final   Norovirus GI/GII NOT DETECTED NOT DETECTED Final   Rotavirus A NOT DETECTED NOT DETECTED Final   Sapovirus (I, II, IV, and V) NOT DETECTED NOT DETECTED Final     Studies: No results found.  Scheduled Meds: . aspirin EC  81 mg Oral Q1200  . atorvastatin  20 mg Oral q1800  . cefTRIAXone (ROCEPHIN)  IV   1 g Intravenous Q24H  . enoxaparin (LOVENOX) injection  30 mg Subcutaneous Q24H  . famotidine  20 mg Oral QHS  . feeding supplement (NEPRO CARB STEADY)  237 mL Oral BID BM  . lactobacillus  1 g Oral TID WC  . metoprolol tartrate  12.5 mg Oral BID  . sodium bicarbonate  650 mg Oral QID  . sodium chloride flush  3 mL Intravenous Q12H    Continuous Infusions: . sodium chloride 75 mL/hr at 05/10/16 0548     Time spent: 40mins  Jahanna Raether MD, PhD  [facility] Pager [phone]. If 7PM-7AM, please contact night-coverage at [email or web address], password [facility] 05/10/2016, 8:13 AM  LOS: 2 days

## 2016-05-10 NOTE — Progress Notes (Signed)
   05/10/16 1000  Clinical Encounter Type  Visited With Patient and family together  Visit Type Initial;Psychological support;Spiritual support;Critical Care  Referral From Nurse  Consult/Referral To Chaplain  Spiritual Encounters  Spiritual Needs Other (Comment) (Advance Directive )  Stress Factors  Patient Stress Factors Other (Comment) (Advance Directive )  Family Stress Factors Other (Comment)  Advance Directives (For Healthcare)  Does patient have an advance directive? Yes  Would patient like information on creating an advanced directive? Yes - Scientist, clinical (histocompatibility and immunogenetics) given;Yes - Spiritual care consult ordered  Type of Advance Directive Healthcare Power of Attorney  Does patient want to make changes to advanced directive? Yes - information given  Copy of advanced directive(s) in chart? Yes   I responded to a Spiritual Care consult for Mr. Oleh Genin.  The patient wanted to name his daughter as his Press photographer.  The document was completed in front of 2 witnesses and a copy was placed in the patient's chart.    [place] M.Div.

## 2016-05-10 NOTE — Consult Note (Signed)
[place] Nurse ostomy consult note Stoma type/location: RLQ ileostomy  SP cath, current UTI Stomal assessment/size: not measured.  Stoma pink patent and producing liquid green stool.  Peristomal assessment: Pouch intact SP cath dressing clean dry and intact, strong smell to urine Treatment options for stomal/peristomal skin: Convex pouch, 1 piece Output liquid green stool Ostomy pouching: 1pc.convex [place] # [phone] Education provided: Patient informed that we would order supplies.  Enrolled patient in [place] program: N/A Will not follow at this time.  Please re-consult if needed.  Domenic Moras RN BSN [place] Pager [phone]

## 2016-05-10 NOTE — Progress Notes (Signed)
Nutrition Follow-up  DOCUMENTATION CODES:   Severe malnutrition in context of acute illness/injury, Underweight  INTERVENTION:  - Continue Nepro Shake (or Ensure Enlive) BID. - Continue to encourage PO intakes of meals and supplements. - Recommend diet liberalization from Renal to Regular.  - RD will continue to monitor for nutrition-related needs.  NUTRITION DIAGNOSIS:   Inadequate oral intake related to acute illness, poor appetite as evidenced by per patient/family report. -ongoing  GOAL:   Patient will meet greater than or equal to 90% of their needs -unmet on average.  MONITOR:   PO intake, Supplement acceptance, Weight trends, Labs, I & O's  ASSESSMENT:   82 y.o. 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 Presented with somnolence and abnormal blood work. He's been taking care of by his daughter at home who is interested in placement patient has had decreased by mouth intake and patient has been more sedated and sleepy. Been having foul-smelling urine patient had his labs checked today by PCP. He was having some vague abdominal discomfort PTA.  10/19 Per chart review, pt consumed 100% of breakfast yesterday and 75% of breakfast this AM. Pt states that for breakfast he had oatmeal, a blueberry muffin, and coffee. Pt states he is missing a few back teeth and that this causes difficulty with chewing. From talking with pt, he seems to have difficulty unless items are very, very tender, almost to the point of being pureed. He does not eat meat because it is too tough and he states that canned fruit is too difficult to chew so at home he cooks it down further so that it is very, very soft.   Recommend diet liberalization as pt reports he that d/t chewing  difficulties and limited food options allowed on current diet order, he has mainly only consumed oatmeal, muffins, and coffee.   Pt refuses physical assessment to be done at this time. Will attempt at next follow-up. No new weight since 10/17.  Medications reviewed; 20 mg oral Pepcid/day, PRN Zofran, 650 mg oral sodium bicarb QID. Labs reviewed; Cl: 113 mmol/L, BUN: 57 mg/dL, creatinine: 2.59 mg/dL, Ca: 8.6 mg/dL, GFR: 24 mL/min.     10/17 - No intakes documented since admission. - Pt states he did not have anything PO yesterday.  - Empty vanilla-flavored Ensure Enlive on bedside table and pt reports this is the only item he had for breakfast.  - He states he is having some slight abdominal pain.  -  Pt received a phone call and RD exited the room; upon returning, pt's door was shut and he was sleeping.  - Unable to perform physical assessment but muscle wasting to upper body apparent, especially in clavicle area.  - Per chart review, pt weighed 100 lbs on 03/22/16 and then gained 23 lbs to gain 123 lbs on 04/02/16.  - Since that time, he has lost 15 lbs (12% body weight) which is significant for time frame. - Will monitor weight trends during hospitalization as pt is currently on a fluid restriction and ordered high rate of IVF; weight changes may not have been entirely related to a loss of lean body mass and fat mass.   Diet Order:  Diet renal with fluid restriction Fluid restriction: 1200 mL Fluid; Room service appropriate? Yes; Fluid consistency: Thin  Skin:  Reviewed, no  issues  Last BM:  10/19  Height:   Ht Readings from Last 1 Encounters:  05/08/16 5' 8.5" (1.74 m)    Weight:   Wt Readings from Last 1 Encounters:  05/08/16 108 lb 8 oz (49.2 kg)    Ideal Body Weight:  71.36 kg  BMI:  Body mass index is 16.26 kg/m.  Estimated Nutritional Needs:   Kcal:  1475-1625 (30-33 kcal/kg)  Protein:  55-65 grams  Fluid:  1.225 L/day (25 mL/kg/day)  EDUCATION NEEDS:   No  education needs identified at this time    Jarome Matin, MS, RD, LDN Inpatient Clinical Dietitian Pager # [phone] After hours/weekend pager # [phone]

## 2016-05-11 LAB — BASIC METABOLIC PANEL
Anion gap: 4 — ABNORMAL LOW (ref 5–15)
BUN: 51 mg/dL — AB (ref 6–20)
CALCIUM: 8.6 mg/dL — AB (ref 8.9–10.3)
CO2: 21 mmol/L — AB (ref 22–32)
CREATININE: 1.99 mg/dL — AB (ref 0.61–1.24)
Chloride: 115 mmol/L — ABNORMAL HIGH (ref 101–111)
GFR calc Af Amer: 33 mL/min — ABNORMAL LOW (ref >60)
GFR calc non Af Amer: 29 mL/min — ABNORMAL LOW (ref >60)
GLUCOSE: 105 mg/dL — AB (ref 65–99)
Potassium: 5 mmol/L (ref 3.5–5.1)
Sodium: 140 mmol/L (ref 135–145)

## 2016-05-11 LAB — HEPATITIS PANEL, ACUTE
HCV Ab: 0.1 s/co ratio (ref 0.0–0.9)
HEP B C IGM: NEGATIVE
HEP B S AG: NEGATIVE
Hep A IgM: NEGATIVE

## 2016-05-11 LAB — CBC
HEMATOCRIT: 26.4 % — AB (ref 39.0–52.0)
Hemoglobin: 8.6 g/dL — ABNORMAL LOW (ref 13.0–17.0)
MCH: 29 pg (ref 26.0–34.0)
MCHC: 32.6 g/dL (ref 30.0–36.0)
MCV: 88.9 fL (ref 78.0–100.0)
Platelets: 333 10*3/uL (ref 150–400)
RBC: 2.97 MIL/uL — ABNORMAL LOW (ref 4.22–5.81)
RDW: 16.6 % — AB (ref 11.5–15.5)
WBC: 13.7 10*3/uL — ABNORMAL HIGH (ref 4.0–10.5)

## 2016-05-11 MED ORDER — MIRTAZAPINE 15 MG PO TABS
7.5000 mg | ORAL_TABLET | Freq: Every day | ORAL | Status: DC
  Administered 2016-05-11 – 2016-05-14 (×4): 7.5 mg via ORAL
  Filled 2016-05-11 (×4): qty 1

## 2016-05-11 NOTE — Progress Notes (Signed)
Palliative Medicine consult noted. Due to high referral volume, there may be a delay seeing this patient. Please call the Palliative Medicine Team office at [phone] if recommendations are needed in the interim.  Thank you for inviting [place] to see this patient.  Marjie Skiff Sherelle Castelli, RN, BSN, [facility] 05/11/2016 1:06 PM Cell [phone] 8:00-4:00 Monday-Friday Office [phone]

## 2016-05-11 NOTE — Progress Notes (Signed)
Patient ID: Joseph Carrillo, male   DOB: [birth year], 82 y.o.   MRN: [id number] He was up walking in the unit with assistance.  His situation was discussed with daughter.  He is not a candidate for surgery on this fistula.  Will ask Dr. Diona Fanti about irrigants for bladder/rectum.    I spoke with Dr. Erlinda Hong and I think that Palliative Care assistance is a good idea.   Kaylyn Lim, MD, FACS

## 2016-05-11 NOTE — Progress Notes (Signed)
Physical Therapy Treatment Patient Details Name: Joseph Carrillo MRN: [id number] DOB: 26-Oct-1926 Today's Date: 05/11/2016    History of Present Illness 82 y.o. male admitted with fatigue and abnormal blood work. Dx of hydroureteronephrosis, ARF, hyperkalemia, UTI.  PMH obstructive uropathy, urostomy, colostomy, prostate cancer, CAD, CABG, GERD.     PT Comments    Assisted OOB to amb a limited distance.  Pt is weak and still requires + 2 assist.  Follow Up Recommendations  SNF (consulted with LPT, pt progressing slowly and would benefit from ST Rehab at [facility])     Equipment Recommendations  None recommended by PT    Recommendations for Other Services       Precautions / Restrictions Precautions Precautions: Fall Precaution Comments: multiple drains Restrictions Weight Bearing Restrictions: No    Mobility  Bed Mobility Overal bed mobility: Needs Assistance Bed Mobility: Supine to Sit     Supine to sit: Min guard;Min assist     General bed mobility comments: increased time and caution to multiple lines, tubes  Transfers Overall transfer level: Needs assistance Equipment used: Rolling walker (2 wheeled) Transfers: Sit to/from Stand Sit to Stand: +2 safety/equipment;Min guard         General transfer comment: + 2 assist to watch multiple lines/drains/tube then 25% VC's to reach back prior to sit to control decend  Ambulation/Gait Ambulation/Gait assistance: Min assist Ambulation Distance (Feet): 74 Feet Assistive device: Rolling walker (2 wheeled)   Gait velocity: decreased   General Gait Details: + 2 for equipment and multiple lines/drains.  Tolerated increased distance.  Slow but steady.  < 25% assist to maneuver walker around obsticles.  Mod c/o fatigue.     Stairs            Wheelchair Mobility    Modified Rankin (Stroke Patients Only)       Balance                                    Cognition                             Exercises      General Comments        Pertinent Vitals/Pain Pain Assessment: 0-10 Pain Score: 7  Pain Location: R side back near Neph drain Pain Descriptors / Indicators: Aching Pain Intervention(s): Monitored during session    Home Living                      Prior Function            PT Goals (current goals can now be found in the care plan section) Progress towards PT goals: Progressing toward goals    Frequency    Min 3X/week      PT Plan Current plan remains appropriate    Co-evaluation             End of Session Equipment Utilized During Treatment: Gait belt Activity Tolerance: Patient limited by fatigue;No increased pain Patient left: in chair;with call bell/phone within reach;with chair alarm set     Time: [id number] PT Time Calculation (min) (ACUTE ONLY): 25 min  Charges:  $Gait Training: 8-22 mins $Therapeutic Activity: 8-22 mins                    G Codes:      Rica Koyanagi  PTA [facility]  Acute  Rehab Pager      [phone]

## 2016-05-11 NOTE — Progress Notes (Signed)
PROGRESS NOTE  Joseph Carrillo [id number] DOB: 1927-01-20 DOA: 05/07/2016 PCP: [name], MD  HPI/Recap of past 24 hours:  Patient remain weak, continued purulent drainage from suprapubic catheter,  remain in sinus rhythm with occasional pac's, no more svt episodes, bp stable   Bilateral nephrostomy tubes also in place with clear drainage, colostomy with liquid stool Report no appetite and continue to loose weight, feeling depressed  Assessment/Plan: Active Problems:   History of urostomy   Colostomy in place [facility])   Prostate cancer ([place])   Hyperkalemia   UTI (urinary tract infection)   Leukocytosis   Anemia   Protein-calorie malnutrition, severe   Acute on chronic renal failure ([place])   Acute renal failure (ARF) ([place])  82 y.o. 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  Ct chest no penumomediastinum, no pna,  CT abdomen showed bilateral nephrostomy tubes, left sided hydrouretonephrosis and some mobile stone in the bladder Blood culture pending, urine culture obtained from bilateral nephrostomy tube and suprapubic catheter all grew ecoli. mrsa screen negative, stool pcr panel negative, c diff negative Currently on rocephin, start on probiotics, d/c ppi, start pepcid due to watery stool  Obstructive uropathy: s/p bilateral nephrostomy tube placement from prior hospitalization, ct ab showed persistent moderate left sided hydroureteronephrosis, urology and IR consulted   Enterovesicular fistula, s/p colostomy and suprapubic catherter Suprapubic catheter drainage remain purulent and malodorous, I  talked to interventional radiology Dr Annamaria Boots who recommended fistulogram to evaluate enterovesicular fistula. Cystogram showed: large fistulous communication between posterior bladder and rectum with retrograde filling of sigmoid colon. General surgery input appreciated, per general surgery Dr Hassell Done, patient is not a surgical candidate, he will discuss with urology regarding palliative irrigation. He agreed to palliative care consult.   Acute on chronic renal failure (HCC) (unclear baseline cr, patient report required dialysis at some point in the past).   continue hydration,defer  ir and urology for obstructive uropathy management, treat  uti Cr improving    SVT broked with adenosine, now restarted back on betablocker which was held due to initial Bradycardia , continue hold clonidine,  monitor on telemetry, echo lvef wnl, grade II diastolic dysfunction, no wall motion abmormalities tsh 0.6,  Mag 1.4, replace mag  . Hyperkalemia mild,, metabolic acidosis,  improving, continue bicarb supplement, low potassium diet  . Prostate cancer [facility]) status post resection patient endorsing pelvic pain, CTab pel, ct chest did not report metastatic spread, psa 0.  . Protein-calorie malnutrition, severe , Prealbumin 8.4, nutrition consult Body mass index is 16.26 kg/m.   .. Anemia chronic : anemia work up pending, monitor, transfuse prn to keep hgb >7  H/o CAD s/p cabg in 2000, h/o valvular heart disease s/p bioprosthetic valve replacement in 2005, unknown detail; no chest pain, continue betablocker, asa, statin Echo lvef wnl, grade II diastolic dysfunction, no wall motion abmormalities He was recently evaluated by cardiology at [facility], no recommendation of ischemic work up  a dominant noncalcified nodule in the left lower lobe measuring 10 x 7 mm, mean 8.5 mm. Non-contrast chest CT at 3-6 months is recommended   FTT: patient has multiple hospitalization since august this year,  with progressive  weakness, will need snf placement  Depression and poor appetite; trial of remeron started  Overall poor prognosis: due to continued purulent drainage from enterovesical fistula, patient will have recurrent infection/sepsis, not a surgical candidate. General surgery and urology is to discuss any palliative approach, irrigation ? Palliative team consulted  DVT prophylaxis:    Lovenox 30 mg sub Q    Code Status:  FULL CODE   as per patient    Family Communication:    Now patient finalized HPOA paper work , he appointed her daughter as his HPOA.    Disposition Plan:   SNF                                                 Consultants:  Urology  IR  Palliative care  Procedures:  none  Antibiotics:  rocephin   Objective: BP (!) 145/46   Pulse (!) 53   Temp 98.8 F (37.1 C) (Axillary)   Resp 17   Ht 5' 8.5" (1.74 m)   Wt 49.2 kg (108 lb 8 oz)   SpO2 100%   BMI 16.26 kg/m   Intake/Output Summary (Last 24 hours) at 05/11/16 0758 Last data filed at 05/11/16 0000  Gross per 24 hour  Intake              320 ml  Output             2500 ml  Net            -2180 ml   Filed Weights   05/07/16 1722 05/08/16 0231  Weight: 49 kg (108 lb) 49.2 kg (108 lb 8 oz)    Exam:   General:  Frail , thin, oriented x3  Cardiovascular: RRR  Respiratory: CTABL  Abdomen: + watery stool in colostomy bag, + suprapubic catheter with purulent drainage, + bilateral nephrostomy tubes, generalized mild discomfort lower abdomen, suprapubic region, no guarding, no rebound, positive BS  Musculoskeletal: No Edema  Neuro: aaox3  Data Reviewed: Basic Metabolic Panel:  Recent Labs Lab 05/07/16 1227 05/07/16 1944 05/08/16 0405 05/10/16 0347 05/11/16 0329  NA 133* 133* 133* 137 140  K 5.3* 5.3* 5.1 5.1 5.0  CL 101 103 106 113* 115*  CO2 17* 20* 19* 19* 21*  GLUCOSE 145* 126* 161* 103* 105*  BUN 63* 64* 64* 57* 51*  CREATININE 3.19* 3.13* 3.06* 2.59* 1.99*  CALCIUM 8.9 9.1  8.7* 8.6* 8.6*  MG  --   --  1.4* 2.1  --   PHOS  --   --  4.8*  --   --    Liver Function Tests:  Recent Labs Lab 05/07/16 1227 05/07/16 1944 05/08/16 0405 05/10/16 0347  AST 76* 73* 54* 29  ALT 71* 75* 57 35  ALKPHOS 122* 125 112 95  BILITOT 0.5 0.7 0.5 0.4  PROT 6.7 7.3 6.3* 5.8*  ALBUMIN 3.0* 2.7* 2.5* 2.2*    Recent Labs Lab 05/07/16 1944  LIPASE 27   No results for input(s): AMMONIA in the last 168 hours. CBC:  Recent Labs Lab 05/07/16 1227 05/07/16 1944 05/08/16 0405 05/10/16 0347 05/11/16 0329  WBC 22.4 Repeated and verified X2.* 23.9* 21.2* 12.3* 13.7*  NEUTROABS  --  21.7*  --   --   --   HGB 10.2* 9.3* 8.4* 8.4* 8.6*  HCT 31.1* 28.4*  25.5* 25.8* 26.4*  MCV 89.3 88.5 88.9 88.1 88.9  PLT 251.0 322 290 308 333   Cardiac Enzymes:    Recent Labs Lab 05/08/16 0405 05/08/16 1047 05/08/16 1434  TROPONINI <0.03 0.03* 0.03*   BNP (last 3 results) No results for input(s): BNP in the last 8760 hours.  ProBNP (last 3 results) No results for input(s): PROBNP in the last 8760 hours.  CBG: No results for input(s): GLUCAP in the last 168 hours.  Recent Results (from the past 240 hour(s))  Blood culture (routine x 2)     Status: None (Preliminary result)   Collection Time: 05/07/16  5:01 PM  Result Value Ref Range Status   Specimen Description BLOOD RIGHT ARM  Final   Special Requests BOTTLES DRAWN AEROBIC AND ANAEROBIC 5CC  Final   Culture   Final    NO GROWTH 3 DAYS Performed at [facility]    Report Status PENDING  Incomplete  MRSA PCR Screening     Status: None   Collection Time: 05/08/16  2:40 AM  Result Value Ref Range Status   MRSA by PCR NEGATIVE NEGATIVE Final    Comment:        The GeneXpert MRSA Assay (FDA approved for NASAL specimens only), is one component of a comprehensive MRSA colonization surveillance program. It is not intended to diagnose MRSA infection nor to guide or monitor treatment for MRSA infections.     Culture, blood (Routine X 2) w Reflex to ID Panel     Status: None (Preliminary result)   Collection Time: 05/08/16  4:05 AM  Result Value Ref Range Status   Specimen Description BLOOD LEFT ARM  Final   Special Requests BOTTLES DRAWN AEROBIC AND ANAEROBIC 5 CC  Final   Culture   Final    NO GROWTH 2 DAYS Performed at [facility]    Report Status PENDING  Incomplete  C difficile quick scan w PCR reflex     Status: None   Collection Time: 05/09/16 10:02 AM  Result Value Ref Range Status   C Diff antigen NEGATIVE NEGATIVE Final   C Diff toxin NEGATIVE NEGATIVE Final   C Diff interpretation No C. difficile detected.  Final  Gastrointestinal Panel by PCR , Stool     Status: None   Collection Time: 05/09/16 10:02 AM  Result Value Ref Range Status   Campylobacter species NOT DETECTED NOT DETECTED Final   Plesimonas shigelloides NOT DETECTED NOT DETECTED Final   Salmonella species NOT DETECTED NOT DETECTED Final   Yersinia enterocolitica NOT DETECTED NOT DETECTED Final   Vibrio species NOT DETECTED NOT DETECTED Final   Vibrio cholerae NOT DETECTED NOT DETECTED Final   Enteroaggregative E coli (EAEC) NOT DETECTED NOT DETECTED Final   Enteropathogenic E coli (EPEC) NOT DETECTED NOT DETECTED Final   Enterotoxigenic E coli (ETEC) NOT DETECTED NOT DETECTED Final   Shiga like toxin producing E coli (STEC) NOT DETECTED NOT DETECTED Final   Shigella/Enteroinvasive E coli (EIEC) NOT DETECTED NOT DETECTED Final   Cryptosporidium NOT DETECTED NOT DETECTED Final   Cyclospora cayetanensis NOT DETECTED NOT DETECTED Final   Entamoeba histolytica NOT DETECTED NOT DETECTED Final   Giardia lamblia NOT DETECTED NOT DETECTED Final   Adenovirus F40/41 NOT DETECTED NOT DETECTED Final   Astrovirus NOT DETECTED NOT DETECTED Final   Norovirus GI/GII NOT DETECTED NOT DETECTED Final   Rotavirus A NOT DETECTED NOT DETECTED Final   Sapovirus (I, II, IV, and V) NOT DETECTED  NOT DETECTED Final  Culture,  Urine     Status: None (Preliminary result)   Collection Time: 05/09/16 10:30 AM  Result Value Ref Range Status   Specimen Description URINE, SUPRAPUBIC  Final   Special Requests NONE  Final   Culture   Final    CULTURE REINCUBATED FOR BETTER GROWTH Performed at [facility]    Report Status PENDING  Incomplete  Culture, Urine     Status: None (Preliminary result)   Collection Time: 05/09/16 10:31 AM  Result Value Ref Range Status   Specimen Description KIDNEY RIGHT NEPHROSTOMY  Final   Special Requests NONE  Final   Culture   Final    CULTURE REINCUBATED FOR BETTER GROWTH Performed at [facility]    Report Status PENDING  Incomplete  Culture, Urine     Status: None (Preliminary result)   Collection Time: 05/09/16 10:32 AM  Result Value Ref Range Status   Specimen Description KIDNEY LEFT NEPHROSTOMY  Final   Special Requests NONE  Final   Culture   Final    CULTURE REINCUBATED FOR BETTER GROWTH Performed at [facility]    Report Status PENDING  Incomplete     Studies: Dg Cystogram  Result Date: 05/10/2016 CLINICAL DATA:  Foul smelling head drainage from the urinary bladder, clinical concern for colovesical fistula EXAM: CYSTOGRAM TECHNIQUE: After catheterization of the urinary bladder following sterile technique the bladder was filled with 120 mL Cysto-Hypaque 30% by drip infusion. Serial spot images were obtained during bladder filling and post draining. FLUOROSCOPY TIME:  Fluoroscopy Time:  2 minutes, 36 seconds Radiation Exposure Index (if provided by the fluoroscopic device): 14.3 Number of Acquired Spot Images: 0 COMPARISON:  05/08/2016 FINDINGS: Initial fluoroscopy over the abdomen demonstrates bilateral nephrostomy tubes along with lumbar spondylosis. I injected the urinary bladder through the super pubic catheter. The urinary bladder is irregular inferiorly were there is a large filling defect. There is prompt posterior drainage into the rectum. The  whole posterior portion of the urinary bladder is irregular. With turning of the patient under fluoroscopic observation, I believe that the fistulous connection to the rectum is directly posterior to the urinary bladder in the midline. We are able to retrograde fill the distal sigmoid colon through injection of the suprapubic catheter. IMPRESSION: 1. 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 This caused retrograde filling of the sigmoid colon as I injected the urinary bladder. 2. Filling defect along the bottom of the urinary bladder is nonspecific. This might be from an enlarged prostate gland but blood clot or tumor is not excluded. 3. The patient had difficulty turning due to infirmity, which limited our exam and our ability to obtain images at different angles. Electronically Signed   By: Van Clines M.D.   On: 05/10/2016 13:16    Scheduled Meds: . aspirin EC  81 mg Oral Q1200  . atorvastatin  20 mg Oral q1800  . cefTRIAXone (ROCEPHIN)  IV  1 g Intravenous Q24H  . enoxaparin (LOVENOX) injection  30 mg Subcutaneous Q24H  . famotidine  20 mg Oral QHS  . feeding supplement (NEPRO CARB STEADY)  237 mL Oral BID BM  . lactobacillus  1 g Oral TID WC  . metoprolol tartrate  12.5 mg Oral BID  . sodium bicarbonate  650 mg Oral QID  . sodium chloride flush  3 mL Intravenous Q12H    Continuous Infusions:     Time spent: 35mins  Darl Kuss  MD, PhD  [facility] Pager [phone]. If 7PM-7AM, please contact night-coverage at [email or web address], password [facility] 05/11/2016, 7:58 AM  LOS: 3 days

## 2016-05-12 DIAGNOSIS — Z515 Encounter for palliative care: Secondary | ICD-10-CM

## 2016-05-12 LAB — CBC WITH DIFFERENTIAL/PLATELET
BASOS ABS: 0 10*3/uL (ref 0.0–0.1)
BASOS PCT: 0 %
EOS ABS: 0.3 10*3/uL (ref 0.0–0.7)
EOS PCT: 2 %
HCT: 25.8 % — ABNORMAL LOW (ref 39.0–52.0)
Hemoglobin: 8.2 g/dL — ABNORMAL LOW (ref 13.0–17.0)
LYMPHS PCT: 10 %
Lymphs Abs: 1.1 10*3/uL (ref 0.7–4.0)
MCH: 28.8 pg (ref 26.0–34.0)
MCHC: 31.8 g/dL (ref 30.0–36.0)
MCV: 90.5 fL (ref 78.0–100.0)
MONO ABS: 0.6 10*3/uL (ref 0.1–1.0)
Monocytes Relative: 6 %
Neutro Abs: 8.8 10*3/uL — ABNORMAL HIGH (ref 1.7–7.7)
Neutrophils Relative %: 82 %
PLATELETS: 305 10*3/uL (ref 150–400)
RBC: 2.85 MIL/uL — AB (ref 4.22–5.81)
RDW: 17 % — AB (ref 11.5–15.5)
WBC: 10.7 10*3/uL — AB (ref 4.0–10.5)

## 2016-05-12 LAB — CULTURE, BLOOD (ROUTINE X 2): Culture: NO GROWTH

## 2016-05-12 LAB — BASIC METABOLIC PANEL
ANION GAP: 5 (ref 5–15)
BUN: 43 mg/dL — ABNORMAL HIGH (ref 6–20)
CALCIUM: 8.7 mg/dL — AB (ref 8.9–10.3)
CO2: 22 mmol/L (ref 22–32)
Chloride: 115 mmol/L — ABNORMAL HIGH (ref 101–111)
Creatinine, Ser: 1.91 mg/dL — ABNORMAL HIGH (ref 0.61–1.24)
GFR, EST AFRICAN AMERICAN: 35 mL/min — AB (ref >60)
GFR, EST NON AFRICAN AMERICAN: 30 mL/min — AB (ref >60)
Glucose, Bld: 95 mg/dL (ref 65–99)
POTASSIUM: 5.6 mmol/L — AB (ref 3.5–5.1)
SODIUM: 142 mmol/L (ref 135–145)

## 2016-05-12 NOTE — Progress Notes (Signed)
Gave report to tele floor nurse receiving pt. Will transfer pt. To telemetry floor.

## 2016-05-12 NOTE — Progress Notes (Signed)
No charge note Palliative consult request received Chart reviewed  Patient seen this morning. He is irate regarding his breakfast order. He is unwilling to discuss further.  Call placed and discussed with daughter [name]. I introduced palliative care and discussed briefly with her over the phone regarding the patient's medical conditions, input from surgery, and the need to establish goals of care.   Daughter [name] wishes to meet with palliative care tomorrow 05-13-16 at 9 AM. Full note and final recommendations to follow.   Thank you for the consult.   Loistine Chance MD [facility] palliative medicine team [phone]

## 2016-05-12 NOTE — Progress Notes (Signed)
PROGRESS NOTE  Joseph Carrillo [id number] DOB: 06-Aug-1926 DOA: 05/07/2016 PCP: [name], MD  HPI/Recap of past 24 hours:  Patient remains feeling weak, continued purulent drainage from suprapubic catheter,  C/o chicken that was not properly cooked, it is hard for him to chew it.  He remains in sinus rhythm with occasional pac's, no more svt episodes, bp stable   Bilateral nephrostomy tubes also in place with clear drainage, colostomy with liquid stool   Assessment/Plan: Active Problems:   History of urostomy   Colostomy in place [facility])   Prostate cancer (HCC)   Hyperkalemia   UTI (urinary tract infection)   Leukocytosis   Anemia   Protein-calorie malnutrition, severe   Acute on chronic renal failure ([place])   Acute renal failure (ARF) ([place])  82 y.o. 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  Ct chest no penumomediastinum, no pna,  CT abdomen showed bilateral nephrostomy tubes, left sided hydrouretonephrosis and some mobile stone in the bladder Blood culture pending, urine culture obtained from bilateral nephrostomy tube and suprapubic catheter all grew ecoli. mrsa screen negative, stool pcr panel negative, c diff negative Currently on rocephin, start on probiotics, d/c ppi, start pepcid due to watery stool  Obstructive uropathy: s/p bilateral nephrostomy tube placement from prior hospitalization, ct ab showed persistent moderate left sided hydroureteronephrosis, urology and IR consulted   Enterovesicular fistula, s/p colostomy and suprapubic catherter Suprapubic catheter drainage remain  purulent and malodorous, I talked to interventional radiology Dr Annamaria Boots who recommended fistulogram to evaluate enterovesicular fistula. Cystogram showed: large fistulous communication between posterior bladder and rectum with retrograde filling of sigmoid colon. General surgery input appreciated, per general surgery Dr Hassell Done, patient is not a surgical candidate, he will discuss with urology regarding palliative irrigation. He agreed to palliative care consult.   Acute on chronic renal failure (HCC) (unclear baseline cr, patient report required dialysis at some point in the past).   continue hydration,defer  ir and urology for obstructive uropathy management, treat  uti Cr improving    SVT broked with adenosine, now restarted back on betablocker which was held due to initial Bradycardia , continue hold clonidine,  monitor on telemetry, echo lvef wnl, grade II diastolic dysfunction, no wall motion abmormalities tsh 0.6,  Mag 1.4, replace mag  . Hyperkalemia mild,, metabolic acidosis,  improving, continue bicarb supplement, low potassium diet  . Prostate cancer [facility]) status post resection patient endorsing pelvic pain, CTab pel, ct chest did not report metastatic spread, psa 0.  . Protein-calorie malnutrition, severe , Prealbumin 8.4, nutrition consult Body mass index is 16.26 kg/m.   .. Anemia chronic : anemia work up pending, monitor, transfuse prn to keep hgb >7  H/o CAD s/p cabg in 2000, h/o valvular heart disease s/p bioprosthetic valve replacement in 2005, unknown detail; no chest pain, continue betablocker, asa, statin Echo lvef wnl, grade II diastolic dysfunction, no wall motion abmormalities He was recently evaluated by cardiology at [facility], no recommendation of ischemic work up  a dominant noncalcified nodule in the left lower lobe measuring 10 x 7 mm, mean 8.5 mm. Non-contrast chest CT at 3-6 months is recommended  FTT: patient has multiple hospitalization since august  this year, with progressive weakness, will need snf placement  Depression and poor appetite; trial of remeron started  Overall poor prognosis: due to continued purulent drainage from enterovesical fistula, patient will have recurrent infection/sepsis, not a surgical candidate. General surgery and urology is to discuss any palliative approach, irrigation ? Palliative team consulted  DVT prophylaxis:    Lovenox 30 mg sub Q    Code Status:  FULL CODE   as per patient    Family Communication:    Now patient finalized HPOA paper work , he appointed her daughter as his HPOA.    Disposition Plan:   transfer out of stepdown to med tele, eventually SNF                                                 Consultants:  Urology  IR  Palliative care  Procedures:  none  Antibiotics:  rocephin   Objective: BP (!) 164/33   Pulse (!) 43   Temp 97.4 F (36.3 C) (Oral)   Resp 12   Ht 5' 8.5" (1.74 m)   Wt 49.2 kg (108 lb 8 oz)   SpO2 100%   BMI 16.26 kg/m   Intake/Output Summary (Last 24 hours) at 05/12/16 0814 Last data filed at 05/12/16 0700  Gross per 24 hour  Intake              653 ml  Output             1725 ml  Net            -1072 ml   Filed Weights   05/07/16 1722 05/08/16 0231  Weight: 49 kg (108 lb) 49.2 kg (108 lb 8 oz)    Exam:   General:  Frail , thin, oriented x3  Cardiovascular: RRR  Respiratory: CTABL  Abdomen: + watery stool in colostomy bag, + suprapubic catheter with purulent drainage, + bilateral nephrostomy tubes, generalized mild discomfort lower abdomen, suprapubic region, no guarding, no rebound, positive BS  Musculoskeletal: No Edema  Neuro: aaox3  Data Reviewed: Basic Metabolic Panel:  Recent Labs Lab 05/07/16 1944 05/08/16 0405 05/10/16 0347 05/11/16 0329 05/12/16 0349  NA 133* 133* 137 140 142  K 5.3* 5.1 5.1 5.0 5.6*  CL 103 106 113* 115* 115*  CO2 20* 19* 19* 21* 22  GLUCOSE 126* 161* 103* 105* 95  BUN 64* 64* 57*  51* 43*  CREATININE 3.13* 3.06* 2.59* 1.99* 1.91*  CALCIUM 9.1 8.7* 8.6* 8.6* 8.7*  MG  --  1.4* 2.1  --   --   PHOS  --  4.8*  --   --   --    Liver Function Tests:  Recent Labs Lab 05/07/16 1227 05/07/16 1944 05/08/16 0405 05/10/16 0347  AST 76* 73* 54* 29  ALT 71* 75* 57 35  ALKPHOS 122* 125 112 95  BILITOT 0.5 0.7 0.5 0.4  PROT 6.7 7.3 6.3* 5.8*  ALBUMIN 3.0* 2.7* 2.5* 2.2*    Recent Labs Lab 05/07/16 1944  LIPASE 27   No results for input(s): AMMONIA in the last 168 hours. CBC:  Recent Labs Lab 05/07/16 1944 05/08/16 0405 05/10/16 0347 05/11/16 0329 05/12/16 0349  WBC 23.9* 21.2* 12.3* 13.7* 10.7*  NEUTROABS 21.7*  --   --   --  8.8*  HGB 9.3* 8.4* 8.4* 8.6* 8.2*  HCT 28.4* 25.5* 25.8* 26.4* 25.8*  MCV 88.5 88.9 88.1 88.9 90.5  PLT 322 290 308 333 305   Cardiac Enzymes:    Recent Labs Lab 05/08/16 0405 05/08/16 1047 05/08/16 1434  TROPONINI <0.03 0.03* 0.03*   BNP (last 3 results) No results for input(s): BNP in the last 8760 hours.  ProBNP (last 3 results) No results for input(s): PROBNP in the last 8760 hours.  CBG: No results for input(s): GLUCAP in the last 168 hours.  Recent Results (from the past 240 hour(s))  Blood culture (routine x 2)     Status: None (Preliminary result)   Collection Time: 05/07/16  5:01 PM  Result Value Ref Range Status   Specimen Description BLOOD RIGHT ARM  Final   Special Requests BOTTLES DRAWN AEROBIC AND ANAEROBIC 5CC  Final   Culture   Final    NO GROWTH 4 DAYS Performed at [facility]    Report Status PENDING  Incomplete  MRSA PCR Screening     Status: None   Collection Time: 05/08/16  2:40 AM  Result Value Ref Range Status   MRSA by PCR NEGATIVE NEGATIVE Final    Comment:        The GeneXpert MRSA Assay (FDA approved for NASAL specimens only), is one component of a comprehensive MRSA colonization surveillance program. It is not intended to diagnose MRSA infection nor to guide  or monitor treatment for MRSA infections.   Culture, blood (Routine X 2) w Reflex to ID Panel     Status: None (Preliminary result)   Collection Time: 05/08/16  4:05 AM  Result Value Ref Range Status   Specimen Description BLOOD LEFT ARM  Final   Special Requests BOTTLES DRAWN AEROBIC AND ANAEROBIC 5 CC  Final   Culture   Final    NO GROWTH 3 DAYS Performed at [facility]    Report Status PENDING  Incomplete  C difficile quick scan w PCR reflex     Status: None   Collection Time: 05/09/16 10:02 AM  Result Value Ref Range Status   C Diff antigen NEGATIVE NEGATIVE Final   C Diff toxin NEGATIVE NEGATIVE Final   C Diff interpretation No C. difficile detected.  Final  Gastrointestinal Panel by PCR , Stool     Status: None   Collection Time: 05/09/16 10:02 AM  Result Value Ref Range Status   Campylobacter species NOT DETECTED NOT DETECTED Final   Plesimonas shigelloides NOT DETECTED NOT DETECTED Final   Salmonella species NOT DETECTED NOT DETECTED Final   Yersinia enterocolitica NOT DETECTED NOT DETECTED Final   Vibrio species NOT DETECTED NOT DETECTED Final   Vibrio cholerae NOT DETECTED NOT DETECTED Final   Enteroaggregative E coli (EAEC) NOT DETECTED NOT DETECTED Final   Enteropathogenic E coli (EPEC) NOT DETECTED NOT DETECTED Final   Enterotoxigenic E coli (ETEC) NOT DETECTED NOT DETECTED Final   Shiga like toxin producing E coli (STEC) NOT DETECTED NOT DETECTED Final   Shigella/Enteroinvasive E coli (EIEC) NOT DETECTED NOT DETECTED Final   Cryptosporidium NOT DETECTED NOT DETECTED Final   Cyclospora cayetanensis NOT DETECTED NOT DETECTED Final   Entamoeba histolytica NOT DETECTED NOT DETECTED Final   Giardia lamblia NOT DETECTED NOT DETECTED Final   Adenovirus F40/41 NOT DETECTED NOT DETECTED Final   Astrovirus NOT DETECTED NOT DETECTED Final   Norovirus GI/GII NOT DETECTED NOT DETECTED Final   Rotavirus A NOT DETECTED NOT DETECTED Final  Sapovirus (I, II, IV, and V)  NOT DETECTED NOT DETECTED Final  Culture, Urine     Status: Abnormal (Preliminary result)   Collection Time: 05/09/16 10:30 AM  Result Value Ref Range Status   Specimen Description URINE, SUPRAPUBIC  Final   Special Requests NONE  Final   Culture (A)  Final    >=100,000 COLONIES/mL ESCHERICHIA COLI 80,000 COLONIES/mL ENTEROCOCCUS FAECIUM    Report Status PENDING  Incomplete  Culture, Urine     Status: Abnormal (Preliminary result)   Collection Time: 05/09/16 10:31 AM  Result Value Ref Range Status   Specimen Description KIDNEY RIGHT NEPHROSTOMY  Final   Special Requests NONE  Final   Culture (A)  Final    50,000 COLONIES/mL ESCHERICHIA COLI >=100,000 COLONIES/mL YEAST    Report Status PENDING  Incomplete  Culture, Urine     Status: Abnormal (Preliminary result)   Collection Time: 05/09/16 10:32 AM  Result Value Ref Range Status   Specimen Description KIDNEY LEFT NEPHROSTOMY  Final   Special Requests NONE  Final   Culture (A)  Final    40,000 COLONIES/mL ESCHERICHIA COLI >=100,000 COLONIES/mL YEAST    Report Status PENDING  Incomplete     Studies: No results found.  Scheduled Meds: . aspirin EC  81 mg Oral Q1200  . atorvastatin  20 mg Oral q1800  . cefTRIAXone (ROCEPHIN)  IV  1 g Intravenous Q24H  . enoxaparin (LOVENOX) injection  30 mg Subcutaneous Q24H  . famotidine  20 mg Oral QHS  . feeding supplement (NEPRO CARB STEADY)  237 mL Oral BID BM  . lactobacillus  1 g Oral TID WC  . metoprolol tartrate  12.5 mg Oral BID  . mirtazapine  7.5 mg Oral QHS  . sodium bicarbonate  650 mg Oral QID  . sodium chloride flush  3 mL Intravenous Q12H    Continuous Infusions:     Time spent: 7mins  Jaryn Hocutt MD, PhD  [facility] Pager [phone]. If 7PM-7AM, please contact night-coverage at [email or web address], password [email or web address] 05/12/2016, 8:14 AM  LOS: 4 days

## 2016-05-13 LAB — CULTURE, BLOOD (ROUTINE X 2): Culture: NO GROWTH

## 2016-05-13 LAB — URINE CULTURE
Culture: 50000 — AB
Culture: >=100000 — AB

## 2016-05-13 LAB — BASIC METABOLIC PANEL
Anion gap: 6 (ref 5–15)
BUN: 37 mg/dL — AB (ref 6–20)
CALCIUM: 8.8 mg/dL — AB (ref 8.9–10.3)
CHLORIDE: 113 mmol/L — AB (ref 101–111)
CO2: 21 mmol/L — AB (ref 22–32)
CREATININE: 1.63 mg/dL — AB (ref 0.61–1.24)
GFR calc non Af Amer: 37 mL/min — ABNORMAL LOW (ref >60)
GFR, EST AFRICAN AMERICAN: 42 mL/min — AB (ref >60)
GLUCOSE: 90 mg/dL (ref 65–99)
Potassium: 5.3 mmol/L — ABNORMAL HIGH (ref 3.5–5.1)
Sodium: 140 mmol/L (ref 135–145)

## 2016-05-13 MED ORDER — FLUCONAZOLE 100 MG PO TABS
100.0000 mg | ORAL_TABLET | Freq: Every day | ORAL | Status: DC
  Administered 2016-05-14 – 2016-05-15 (×2): 100 mg via ORAL
  Filled 2016-05-13 (×2): qty 1

## 2016-05-13 MED ORDER — SODIUM CHLORIDE 0.9 % IV SOLN
500.0000 mg | Freq: Two times a day (BID) | INTRAVENOUS | Status: DC
  Administered 2016-05-13 – 2016-05-15 (×5): 500 mg via INTRAVENOUS
  Filled 2016-05-13 (×6): qty 500

## 2016-05-13 MED ORDER — FLUCONAZOLE IN SODIUM CHLORIDE 200-0.9 MG/100ML-% IV SOLN
200.0000 mg | Freq: Once | INTRAVENOUS | Status: AC
  Administered 2016-05-13: 200 mg via INTRAVENOUS
  Filled 2016-05-13: qty 100

## 2016-05-13 NOTE — Progress Notes (Addendum)
PROGRESS NOTE  Harvard Quattrochi [id number] DOB: 1926-08-06 DOA: 05/07/2016 PCP: [name], MD  HPI/Recap of past 24 hours:  Patient remains feeling weak, continued malodorous purulent drainage from suprapubic catheter,   Remain has mild hyperkalemia, he is not happy about the renal diet  He remains in sinus rhythm with occasional pac's, no more svt episodes, bp stable   Bilateral nephrostomy tubes also in place with clear drainage, colostomy with liquid stool   Assessment/Plan: Active Problems:   History of urostomy   Colostomy in place [facility])   Prostate cancer ([place])   Hyperkalemia   UTI (urinary tract infection)   Leukocytosis   Anemia   Protein-calorie malnutrition, severe   Acute on chronic renal failure ([place])   Acute renal failure (ARF) ([place])  82 y.o. 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  Ct chest no penumomediastinum, no pna,  CT abdomen showed bilateral nephrostomy tubes, left sided hydrouretonephrosis and some mobile stone in the bladder Blood culture pending, urine culture obtained from bilateral nephrostomy tube and suprapubic catheter all grew ecoli. mrsa screen negative, stool pcr panel negative, c diff negative Currently on rocephin, start on probiotics, d/c ppi, start pepcid due to watery stool  Obstructive uropathy: s/p bilateral nephrostomy tube placement from prior hospitalization, ct ab showed persistent moderate left sided hydroureteronephrosis, urology and IR consulted   Enterovesicular fistula, s/p colostomy and suprapubic catherter Suprapubic catheter drainage  remain purulent and malodorous, I talked to interventional radiology Dr Annamaria Boots who recommended fistulogram to evaluate enterovesicular fistula. Cystogram showed: large fistulous communication between posterior bladder and rectum with retrograde filling of sigmoid colon. General surgery input appreciated, per general surgery Dr Hassell Done, patient is not a surgical candidate, he will discuss with urology regarding palliative irrigation. He agreed to palliative care consult.   Acute on chronic renal failure (HCC) (unclear baseline cr, patient report required dialysis at some point in the past).   continue hydration,defer  ir and urology for obstructive uropathy management, treat  uti Cr improving    SVT broked with adenosine, now restarted back on betablocker which was held due to initial Bradycardia , continue hold clonidine,  monitor on telemetry, echo lvef wnl, grade II diastolic dysfunction, no wall motion abmormalities tsh 0.6,  Mag 1.4, replace mag  . Hyperkalemia mild,, metabolic acidosis,  improving, continue bicarb supplement, low potassium diet  . Prostate cancer [facility]) status post resection patient endorsing pelvic pain, CTab pel, ct chest did not report metastatic spread, psa 0.  . Protein-calorie malnutrition, severe , Prealbumin 8.4, nutrition consult Body mass index is 16.26 kg/m.   .. Anemia chronic : anemia work up pending, monitor, transfuse prn to keep hgb >7  H/o CAD s/p cabg in 2000, h/o valvular heart disease s/p bioprosthetic valve replacement in 2005, unknown detail; no chest pain, continue betablocker, asa, statin Echo lvef wnl, grade II diastolic dysfunction, no wall motion abmormalities He was recently evaluated by cardiology at [facility], no recommendation of ischemic work up  a dominant noncalcified nodule in the left lower lobe measuring 10 x 7 mm, mean 8.5 mm. Non-contrast chest CT at 3-6 months is recommended   FTT:  patient has multiple hospitalization since  august this year, with progressive weakness, will need snf placement  Depression and poor appetite; trial of remeron started  Overall poor prognosis: due to continued purulent drainage from enterovesical fistula, patient will have recurrent infection/sepsis, not a surgical candidate. General surgery and urology is to discuss any palliative approach, irrigation ? Palliative team consulted  3pm 10/22 Addendum: urine culture + ESBL and yeast, will chang abx to imipenem, add diflucan.  DVT prophylaxis:    Lovenox 30 mg sub Q    Code Status:  DNR established on 10/22 after palliative care meeting    Family Communication:    Now patient finalized HPOA paper work , he appointed her daughter as his HPOA.    Disposition Plan:    SNF with palliative care continue following at snf, eventually may need hospice                                                 Consultants:  Urology  IR  Palliative care  Procedures:  none  Antibiotics:  rocephin   Objective: BP (!) 134/53 (BP Location: Left Arm)   Pulse 72   Temp 98.6 F (37 C) (Oral)   Resp 16   Ht 5' 8.5" (1.74 m)   Wt 49.2 kg (108 lb 8 oz)   SpO2 100%   BMI 16.26 kg/m   Intake/Output Summary (Last 24 hours) at 05/13/16 1204 Last data filed at 05/13/16 0908  Gross per 24 hour  Intake              520 ml  Output             1205 ml  Net             -685 ml   Filed Weights   05/07/16 1722 05/08/16 0231  Weight: 49 kg (108 lb) 49.2 kg (108 lb 8 oz)    Exam:   General:  Frail , thin, oriented x3  Cardiovascular: RRR  Respiratory: CTABL  Abdomen: + watery stool in colostomy bag, + suprapubic catheter with purulent drainage, + bilateral nephrostomy tubes, generalized mild discomfort lower abdomen, suprapubic region, no guarding, no rebound, positive BS  Musculoskeletal: No Edema  Neuro: aaox3  Data Reviewed: Basic Metabolic Panel:  Recent Labs Lab 05/08/16 0405 05/10/16 0347 05/11/16 0329  05/12/16 0349 05/13/16 0535  NA 133* 137 140 142 140  K 5.1 5.1 5.0 5.6* 5.3*  CL 106 113* 115* 115* 113*  CO2 19* 19* 21* 22 21*  GLUCOSE 161* 103* 105* 95 90  BUN 64* 57* 51* 43* 37*  CREATININE 3.06* 2.59* 1.99* 1.91* 1.63*  CALCIUM 8.7* 8.6* 8.6* 8.7* 8.8*  MG 1.4* 2.1  --   --   --   PHOS 4.8*  --   --   --   --    Liver Function Tests:  Recent Labs Lab 05/07/16 1227 05/07/16 1944 05/08/16 0405 05/10/16 0347  AST 76* 73* 54* 29  ALT 71* 75* 57 35  ALKPHOS 122* 125 112 95  BILITOT 0.5 0.7 0.5 0.4  PROT 6.7 7.3 6.3* 5.8*  ALBUMIN 3.0* 2.7* 2.5* 2.2*    Recent Labs Lab 05/07/16 1944  LIPASE 27   No results for input(s): AMMONIA in the last 168 hours. CBC:  Recent Labs Lab 05/07/16 1944 05/08/16 0405 05/10/16  [id number] 05/11/16 0329 05/12/16 0349  WBC 23.9* 21.2* 12.3* 13.7* 10.7*  NEUTROABS 21.7*  --   --   --  8.8*  HGB 9.3* 8.4* 8.4* 8.6* 8.2*  HCT 28.4* 25.5* 25.8* 26.4* 25.8*  MCV 88.5 88.9 88.1 88.9 90.5  PLT 322 290 308 333 305   Cardiac Enzymes:    Recent Labs Lab 05/08/16 0405 05/08/16 1047 05/08/16 1434  TROPONINI <0.03 0.03* 0.03*   BNP (last 3 results) No results for input(s): BNP in the last 8760 hours.  ProBNP (last 3 results) No results for input(s): PROBNP in the last 8760 hours.  CBG: No results for input(s): GLUCAP in the last 168 hours.  Recent Results (from the past 240 hour(s))  Blood culture (routine x 2)     Status: None   Collection Time: 05/07/16  5:01 PM  Result Value Ref Range Status   Specimen Description BLOOD RIGHT ARM  Final   Special Requests BOTTLES DRAWN AEROBIC AND ANAEROBIC 5CC  Final   Culture   Final    NO GROWTH 5 DAYS Performed at [facility]    Report Status 05/12/2016 FINAL  Final  MRSA PCR Screening     Status: None   Collection Time: 05/08/16  2:40 AM  Result Value Ref Range Status   MRSA by PCR NEGATIVE NEGATIVE Final    Comment:        The GeneXpert MRSA Assay (FDA approved for  NASAL specimens only), is one component of a comprehensive MRSA colonization surveillance program. It is not intended to diagnose MRSA infection nor to guide or monitor treatment for MRSA infections.   Culture, blood (Routine X 2) w Reflex to ID Panel     Status: None (Preliminary result)   Collection Time: 05/08/16  4:05 AM  Result Value Ref Range Status   Specimen Description BLOOD LEFT ARM  Final   Special Requests BOTTLES DRAWN AEROBIC AND ANAEROBIC 5 CC  Final   Culture   Final    NO GROWTH 4 DAYS Performed at [facility]    Report Status PENDING  Incomplete  C difficile quick scan w PCR reflex     Status: None   Collection Time: 05/09/16 10:02 AM  Result Value Ref Range Status   C Diff antigen NEGATIVE NEGATIVE Final   C Diff toxin NEGATIVE NEGATIVE Final   C Diff interpretation No C. difficile detected.  Final  Gastrointestinal Panel by PCR , Stool     Status: None   Collection Time: 05/09/16 10:02 AM  Result Value Ref Range Status   Campylobacter species NOT DETECTED NOT DETECTED Final   Plesimonas shigelloides NOT DETECTED NOT DETECTED Final   Salmonella species NOT DETECTED NOT DETECTED Final   Yersinia enterocolitica NOT DETECTED NOT DETECTED Final   Vibrio species NOT DETECTED NOT DETECTED Final   Vibrio cholerae NOT DETECTED NOT DETECTED Final   Enteroaggregative E coli (EAEC) NOT DETECTED NOT DETECTED Final   Enteropathogenic E coli (EPEC) NOT DETECTED NOT DETECTED Final   Enterotoxigenic E coli (ETEC) NOT DETECTED NOT DETECTED Final   Shiga like toxin producing E coli (STEC) NOT DETECTED NOT DETECTED Final   Shigella/Enteroinvasive E coli (EIEC) NOT DETECTED NOT DETECTED Final   Cryptosporidium NOT DETECTED NOT DETECTED Final   Cyclospora cayetanensis NOT DETECTED NOT DETECTED Final   Entamoeba histolytica NOT DETECTED NOT DETECTED Final   Giardia lamblia NOT DETECTED NOT DETECTED Final   Adenovirus F40/41 NOT DETECTED NOT DETECTED Final  Astrovirus  NOT DETECTED NOT DETECTED Final   Norovirus GI/GII NOT DETECTED NOT DETECTED Final   Rotavirus A NOT DETECTED NOT DETECTED Final   Sapovirus (I, II, IV, and V) NOT DETECTED NOT DETECTED Final  Culture, Urine     Status: Abnormal   Collection Time: 05/09/16 10:30 AM  Result Value Ref Range Status   Specimen Description URINE, SUPRAPUBIC  Final   Special Requests NONE  Final   Culture (A)  Final    >=100,000 COLONIES/mL ESCHERICHIA COLI Confirmed Extended Spectrum Beta-Lactamase Producer (ESBL) 80,000 COLONIES/mL ENTEROCOCCUS FAECIUM >=100,000 COLONIES/mL PSEUDOMONAS AERUGINOSA    Report Status 05/13/2016 FINAL  Final   Organism ID, Bacteria ESCHERICHIA COLI (A)  Final   Organism ID, Bacteria ENTEROCOCCUS FAECIUM (A)  Final   Organism ID, Bacteria PSEUDOMONAS AERUGINOSA (A)  Final      Susceptibility   Escherichia coli - MIC*    AMPICILLIN >=32 RESISTANT Resistant     CEFAZOLIN >=64 RESISTANT Resistant     CEFTRIAXONE >=64 RESISTANT Resistant     CIPROFLOXACIN >=4 RESISTANT Resistant     GENTAMICIN >=16 RESISTANT Resistant     IMIPENEM <=0.25 SENSITIVE Sensitive     NITROFURANTOIN <=16 SENSITIVE Sensitive     TRIMETH/SULFA >=320 RESISTANT Resistant     AMPICILLIN/SULBACTAM >=32 RESISTANT Resistant     PIP/TAZO >=128 RESISTANT Resistant     Extended ESBL POSITIVE Resistant     * >=100,000 COLONIES/mL ESCHERICHIA COLI   Enterococcus faecium - MIC*    AMPICILLIN >=32 RESISTANT Resistant     LEVOFLOXACIN >=8 RESISTANT Resistant     NITROFURANTOIN 64 INTERMEDIATE Intermediate     VANCOMYCIN 1 SENSITIVE Sensitive     LINEZOLID 2 SENSITIVE Sensitive     * 80,000 COLONIES/mL ENTEROCOCCUS FAECIUM   Pseudomonas aeruginosa - MIC*    CEFTAZIDIME 4 SENSITIVE Sensitive     CIPROFLOXACIN >=4 RESISTANT Resistant     GENTAMICIN <=1 SENSITIVE Sensitive     IMIPENEM 2 SENSITIVE Sensitive     PIP/TAZO 8 SENSITIVE Sensitive     CEFEPIME 2 SENSITIVE Sensitive     * >=100,000 COLONIES/mL  PSEUDOMONAS AERUGINOSA  Culture, Urine     Status: Abnormal   Collection Time: 05/09/16 10:31 AM  Result Value Ref Range Status   Specimen Description KIDNEY RIGHT NEPHROSTOMY  Final   Special Requests NONE  Final   Culture (A)  Final    50,000 COLONIES/mL ESCHERICHIA COLI Confirmed Extended Spectrum Beta-Lactamase Producer (ESBL) SENSITIVTIES  REPEATED FOR CONFIRMATION >=100,000 COLONIES/mL YEAST Performed at [facility]    Report Status 05/13/2016 FINAL  Final   Organism ID, Bacteria ESCHERICHIA COLI (A)  Final      Susceptibility   Escherichia coli - MIC*    AMPICILLIN >=32 RESISTANT Resistant     CEFAZOLIN >=64 RESISTANT Resistant     CEFEPIME 4 RESISTANT Resistant     CEFTAZIDIME >=64 RESISTANT Resistant     CEFTRIAXONE >=64 RESISTANT Resistant     CIPROFLOXACIN >=4 RESISTANT Resistant     GENTAMICIN >=16 RESISTANT Resistant     IMIPENEM <=0.25 SENSITIVE Sensitive     TRIMETH/SULFA >=320 RESISTANT Resistant     AMPICILLIN/SULBACTAM >=32 RESISTANT Resistant     PIP/TAZO >=128 RESISTANT Resistant     Extended ESBL POSITIVE Resistant     * 50,000 COLONIES/mL ESCHERICHIA COLI  Culture, Urine     Status: Abnormal   Collection Time: 05/09/16 10:32 AM  Result Value Ref Range Status   Specimen Description KIDNEY LEFT NEPHROSTOMY  Final   Special Requests NONE  Final   Culture (A)  Final    40,000 COLONIES/mL ESCHERICHIA COLI Confirmed Extended Spectrum Beta-Lactamase Producer (ESBL) SENSITIVITIES REPEATED FOR CONFIRMATION >=100,000 COLONIES/mL YEAST Performed at [facility]    Report Status 05/13/2016 FINAL  Final   Organism ID, Bacteria ESCHERICHIA COLI (A)  Final      Susceptibility   Escherichia coli - MIC*    AMPICILLIN >=32 RESISTANT Resistant     CEFAZOLIN >=64 RESISTANT Resistant     CEFEPIME 32 RESISTANT Resistant     CEFTAZIDIME >=64 RESISTANT Resistant     CEFTRIAXONE >=64 RESISTANT Resistant     CIPROFLOXACIN >=4 RESISTANT Resistant      GENTAMICIN >=16 RESISTANT Resistant     IMIPENEM <=0.25 SENSITIVE Sensitive     TRIMETH/SULFA >=320 RESISTANT Resistant     AMPICILLIN/SULBACTAM >=32 RESISTANT Resistant     PIP/TAZO >=128 RESISTANT Resistant     Extended ESBL POSITIVE Resistant     * 40,000 COLONIES/mL ESCHERICHIA COLI     Studies: No results found.  Scheduled Meds: . aspirin EC  81 mg Oral Q1200  . atorvastatin  20 mg Oral q1800  . cefTRIAXone (ROCEPHIN)  IV  1 g Intravenous Q24H  . enoxaparin (LOVENOX) injection  30 mg Subcutaneous Q24H  . famotidine  20 mg Oral QHS  . feeding supplement (NEPRO CARB STEADY)  237 mL Oral BID BM  . lactobacillus  1 g Oral TID WC  . metoprolol tartrate  12.5 mg Oral BID  . mirtazapine  7.5 mg Oral QHS  . sodium bicarbonate  650 mg Oral QID  . sodium chloride flush  3 mL Intravenous Q12H    Continuous Infusions:     Time spent: 72mins  Demontray Franta MD, PhD  [facility] Pager [phone]. If 7PM-7AM, please contact night-coverage at [email or web address], password [facility] 05/13/2016, 12:04 PM  LOS: 5 days

## 2016-05-13 NOTE — Progress Notes (Signed)
Pharmacy Antibiotic Note  Joseph Carrillo is a 82 y.o. male admitted on 05/07/2016 with UTI.  Pharmacy has been consulted for Primaxin dosing.  Plan: Primaxin 500 mg IV q12h Fluconazole 200 mg IV x1, then 100 mg PO daily ( MD)   Height: 5' 8.5" (174 cm) Weight: 108 lb 8 oz (49.2 kg) IBW/kg (Calculated) : 69.55  Temp (24hrs), Avg:98.2 F (36.8 C), Min:97.7 F (36.5 C), Max:98.6 F (37 C)   Recent Labs Lab 05/07/16 1944 05/08/16 0405 05/10/16 0347 05/11/16 0329 05/12/16 0349 05/13/16 0535  WBC 23.9* 21.2* 12.3* 13.7* 10.7*  --   CREATININE 3.13* 3.06* 2.59* 1.99* 1.91* 1.63*    Estimated Creatinine Clearance: 22.6 mL/min (by C-G formula based on SCr of 1.63 mg/dL (H)).    No Known Allergies  Antimicrobials this admission: ceftriaxone 10/17 >> 10/22 Primaxin 10/22 >>  Fluconazole 10/22 >>  Dose adjustments this admission: ---  Microbiology results: 10/17 BCx: NGF 10/18 UCx: > 100K colonies yeast, 40k colonies of ESBL E.coli in left nephrostomy > 100 k colonies of yeast,  50k colonies of ESBL E.coli, in right nephrostomy tube  Suprapubic urine: ESBL E.coli, Entereococcus Faecium, Pseudomonas.   Thank you for allowing pharmacy to be a part of this patient's care.   Royetta Asal, PharmD, BCPS Pager [phone] 05/13/2016 4:37 PM

## 2016-05-13 NOTE — Consult Note (Signed)
Consultation Note Date: 05/13/2016   Patient Name: Joseph Carrillo  DOB: [birth year]  MRN: [id number]  Age / Sex: 82 y.o., male  PCP: Darreld Mclean, MD Referring Physician: Florencia Reasons, MD  Reason for Consultation: Establishing goals of care  HPI/Patient Profile: 82 y.o. 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 Patient has obstructive uropathy status post bilateral nephrostomy tube placements from previous hospitalizations CT scan abdomen and pelvis showing persistent moderate left-sided hydroureteronephrosis.  Alley to of care consultation for goals of care discussions. Patient is an elderly appearing gentleman resting in bed initially seen in the morning on 10-21. Patient was irate about his breakfast order asked that I speak with his daughter Joseph Carrillo who stated that she could meet me at 9 AM on 10-20 2-17. Family meeting: Met with the patient and daughter Joseph Carrillo this morning by the patient's bedside. I introduced myself and palliative care as follows: Palliative medicine is specialized medical care for people living with serious illness. It focuses on providing relief from the symptoms and stress of a serious illness. The goal is to improve quality of life for both the patient and the family.  Brief life review  performed. Patient was living by himself. Patient is married, daughter Joseph Carrillo is designated healthcare power of attorney agent. Patient's wife Joseph Carrillo lives with her sister in [place], [place] she has serious health conditions of her own. Discussed with patient and daughter about the patient's current underlying medical conditions. Goals of care and CODE STATUS discussions undertaken. CODE STATUS now established as DO NOT RESUSCITATE/DO NOT INTUBATE. Daughter Joseph Carrillo asked that I give wife Joseph Carrillo a call and have the same discussions. Hence, call placed and discussed with wife Joseph Carrillo at [phone]: She states that she understands the patient's body cannot withstand surgery, she states the patient is tired with advanced age and serious illnesses.  HCPOA  daughter Joseph Carrillo.   SUMMARY OF RECOMMENDATIONS    DNR DNI SNF rehab with palliative on discharge Hospice support after rehab attempt based on disease trajectory Continue antibiotics and irrigation for now  Code Status/Advance Care Planning:  DNR    Symptom Management:    Continue current care  Palliative Prophylaxis:   Bowel Regimen  Psycho-social/Spiritual:   Desire for further Chaplaincy support:no  Additional Recommendations: Education on Hospice  Prognosis:   < 12 months  Discharge Planning: [facility] for rehab with Palliative care service follow-up      Primary Diagnoses: Present on Admission: . Anemia . Acute on chronic renal failure ([place]) .  Hyperkalemia . Leukocytosis . Prostate cancer ([place]) . Protein-calorie malnutrition, severe . UTI (urinary tract infection) . Acute renal failure (ARF) ([place])   I have reviewed the medical record, interviewed the patient and family, and examined the patient. The following aspects are pertinent.  Past Medical History:  Diagnosis Date  . Blood in stool   . Blood transfusion without reported diagnosis   . CAD (coronary artery disease)    a. 2000 - Says he  had CABG x 3 or 4.  . Chicken pox   . Colon polyp   . Elevated blood pressure reading   . Glaucoma   . Heart murmur   . Hx: UTI (urinary tract infection)   . Hyperlipidemia   . Prostate cancer ([place])   . Valvular heart disease    a. 2005 s/p Valve replacement - presumably bioprosthetic (not on anticoagulation).  He doesn't know which valve.   Social History   Social History  . Marital status: Married    Spouse name: N/A  . Number of children: N/A  . Years of education: N/A   Social History Main Topics  . Smoking status: Former Smoker    Quit date: 07/24/1971  . Smokeless tobacco: Never Used  . Alcohol use No     Comment: prev drank.  quit 1973 or 1974.  . Drug use: No  . Sexual activity: Not Asked   Other Topics Concern  . None   Social History Narrative   Lives in [place], [place] area by himself but is currently staying with his sister in [place] area after prolonged hospitalization this summer.   Family History  Problem Relation Age of Onset  . Heart disease Father     Pt says his brother had heart infection and died suddenly.   Scheduled Meds: . aspirin EC  81 mg Oral Q1200  . atorvastatin  20 mg Oral q1800  . cefTRIAXone (ROCEPHIN)  IV  1 g Intravenous Q24H  . enoxaparin (LOVENOX) injection  30 mg Subcutaneous Q24H  . famotidine  20 mg Oral QHS  . feeding supplement (NEPRO CARB STEADY)  237 mL Oral BID BM  . lactobacillus  1 g Oral TID WC  . metoprolol tartrate  12.5 mg Oral BID  . mirtazapine  7.5 mg Oral QHS  . sodium bicarbonate  650 mg Oral QID  . sodium chloride flush  3 mL Intravenous Q12H   Continuous Infusions:  PRN Meds:.acetaminophen **OR** acetaminophen, albuterol, HYDROcodone-acetaminophen, ondansetron **OR** ondansetron (ZOFRAN) IV Medications Prior to Admission:  Prior to Admission medications   Medication Sig Start Date End Date Taking? Authorizing Provider  albuterol (PROVENTIL) (2.5 MG/3ML) 0.083% nebulizer solution Take 3 mLs (2.5 mg total) by  nebulization every 6 (six) hours as needed for wheezing or shortness of breath. 04/05/16  Yes Reyne Dumas, MD  aspirin EC 81 MG tablet Take 81 mg by mouth daily at 12 noon.   Yes Historical Provider, MD  atorvastatin (LIPITOR) 20 MG tablet Take 20 mg by mouth every morning.    Yes Historical Provider, MD  cloNIDine HCl (KAPVAY) 0.1 MG TB12 ER tablet Take 0.1 mg by mouth 2 (two) times daily.   Yes Historical Provider, MD  HYDROcodone-acetaminophen (NORCO/VICODIN) 5-325 MG tablet Take 1 tablet by mouth every 6 (six) hours as needed. Patient taking differently: Take 1 tablet by mouth every 6 (six) hours as needed for moderate pain or severe pain.  05/07/16  Yes Gay Filler Copland, MD  metoprolol tartrate (LOPRESSOR) 25 MG tablet Take  0.5 tablets (12.5 mg total) by mouth 2 (two) times daily. 04/05/16  Yes Reyne Dumas, MD  nitroGLYCERIN (NITROSTAT) 0.4 MG SL tablet Place 0.4 mg under the tongue every 5 (five) minutes as needed for chest pain.   Yes Historical Provider, MD  pantoprazole (PROTONIX) 40 MG tablet Take 40 mg by mouth every morning.    Yes Historical Provider, MD  sodium bicarbonate 650 MG tablet Take 650 mg by mouth 4 (four) times daily.   Yes Historical Provider, MD   No Known Allergies Review of Systems  Physical Exam   Frail , thin, oriented x3  RRR CTABL Abdomen: + watery stool in colostomy bag, + suprapubic catheter with purulent drainage, + bilateral nephrostomy tubes, generalized mild discomfort lower abdomen, suprapubic region, no guarding, no rebound, positive BS Musculoskeletal: No Edema Neuro: aaox3  Vital Signs: BP (!) 134/53 (BP Location: Left Arm)   Pulse 72   Temp 98.6 F (37 C) (Oral)   Resp 16   Ht 5' 8.5" (1.74 m)   Wt 49.2 kg (108 lb 8 oz)   SpO2 100%   BMI 16.26 kg/m  Pain Assessment: 0-10 POSS *See Group Information*: 1-Acceptable,Awake and alert Pain Score: 9    SpO2: SpO2: 100 % O2 Device:SpO2: 100 % O2 Flow Rate: .   IO: Intake/output summary:    Intake/Output Summary (Last 24 hours) at 05/13/16 1122 Last data filed at 05/13/16 0908  Gross per 24 hour  Intake              520 ml  Output             1470 ml  Net             -950 ml    LBM: Last BM Date: 05/11/16 Baseline Weight: Weight: 49 kg (108 lb) Most recent weight: Weight: 49.2 kg (108 lb 8 oz)     Palliative Assessment/Data:   Flowsheet Rows   Flowsheet Row Most Recent Value  Intake Tab  Referral Department  Hospitalist  Unit at Time of Referral  Oncology Unit  Palliative Care Primary Diagnosis  Cancer  Palliative Care Type  New Palliative care  Reason for referral  Clarify Goals of Care  Date first seen by Palliative Care  05/12/16  Clinical Assessment  Palliative Performance Scale Score  30%  Pain Max last 24 hours  5  Pain Min Last 24 hours  4  Dyspnea Max Last 24 Hours  4  Dyspnea Min Last 24 hours  3  Psychosocial & Spiritual Assessment  Palliative Care Outcomes  Patient/Family meeting held?  Yes  Who was at the meeting?  patient daughter who is HCPOA   Palliative Care Outcomes  Clarified goals of care      Time In:  9 Time Out:  10.10 Time Total:  70  Greater than 50%  of this time was spent counseling and coordinating care related to the above assessment and plan.  Signed by: Loistine Chance, MD  [phone]  Please contact Palliative Medicine Team phone at [phone] for questions and concerns.  For individual provider: See Shea Evans

## 2016-05-14 LAB — BASIC METABOLIC PANEL
ANION GAP: 5 (ref 5–15)
BUN: 37 mg/dL — ABNORMAL HIGH (ref 6–20)
CALCIUM: 8.6 mg/dL — AB (ref 8.9–10.3)
CO2: 22 mmol/L (ref 22–32)
Chloride: 113 mmol/L — ABNORMAL HIGH (ref 101–111)
Creatinine, Ser: 2.13 mg/dL — ABNORMAL HIGH (ref 0.61–1.24)
GFR calc non Af Amer: 26 mL/min — ABNORMAL LOW (ref >60)
GFR, EST AFRICAN AMERICAN: 31 mL/min — AB (ref >60)
Glucose, Bld: 93 mg/dL (ref 65–99)
Potassium: 5.7 mmol/L — ABNORMAL HIGH (ref 3.5–5.1)
Sodium: 140 mmol/L (ref 135–145)

## 2016-05-14 LAB — CBC
HEMATOCRIT: 26.4 % — AB (ref 39.0–52.0)
HEMOGLOBIN: 8.3 g/dL — AB (ref 13.0–17.0)
MCH: 28 pg (ref 26.0–34.0)
MCHC: 31.4 g/dL (ref 30.0–36.0)
MCV: 89.2 fL (ref 78.0–100.0)
Platelets: 281 10*3/uL (ref 150–400)
RBC: 2.96 MIL/uL — ABNORMAL LOW (ref 4.22–5.81)
RDW: 16.7 % — AB (ref 11.5–15.5)
WBC: 10.6 10*3/uL — AB (ref 4.0–10.5)

## 2016-05-14 NOTE — Progress Notes (Signed)
PROGRESS NOTE  Joseph Carrillo [id number] DOB: [birth year] DOA: 05/07/2016 PCP: [name], MD  HPI/Recap of past 24 hours:  Feeling slightly better, drainage from suprapubic catheter less cloudy, less odorous  Remain has mild hyperkalemia, he is not happy about the renal diet  He remains in sinus rhythm with occasional pac's, no more svt episodes, bp stable   Bilateral nephrostomy tubes also in place with clear drainage, colostomy with liquid stool   Assessment/Plan: Active Problems:   History of urostomy   Colostomy in place [facility])   Prostate cancer (HCC)   Hyperkalemia   UTI (urinary tract infection)   Leukocytosis   Anemia   Protein-calorie malnutrition, severe   Acute on chronic renal failure ([place])   Acute renal failure (ARF) ([place])  82 y.o. 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  Ct chest no penumomediastinum, no pna,  CT abdomen showed bilateral nephrostomy tubes, left sided hydrouretonephrosis and some mobile stone in the bladder Blood culture no growth,  urine culture obtained from bilateral nephrostomy tube and suprapubic catheter all grew ecoli (ESBL), pseudomonas, enterococcus, yeast  mrsa screen negative, stool pcr panel negative, c diff negative He was treated with rocephin initially this is changed to imipenem due to ESBL in urine culture, added diflucan   Enterovesicular fistula, s/p colostomy and suprapubic catherter Suprapubic catheter drainage remain purulent and malodorous, I talked to interventional radiology Dr Annamaria Boots who recommended fistulogram to evaluate  enterovesicular fistula. Cystogram showed: large fistulous communication between posterior bladder and rectum with retrograde filling of sigmoid colon. General surgery input appreciated, per general surgery Dr Hassell Done, patient is not a surgical candidate, he will discuss with urology regarding palliative irrigation. He agreed to palliative care consult.  I have talked to urology Dr Diona Fanti on 10/23 who will have Dr Matilde Sprang to put recommendations regarding local irrigation as a temporizing measure. I have also talked to infectious disease Dr comer over the phone who recommends oral diflucan and keflex for uti suppression.  Obstructive uropathy: s/p bilateral nephrostomy tube placement from prior hospitalization, ct ab showed persistent moderate left sided hydroureteronephrosis, urology and IR consulted, details please see consult notes.    Acute on chronic renal failure (HCC) (unclear baseline cr, patient report required dialysis at some point in the past).   continue hydration,defer  ir and urology for obstructive uropathy management, treat  uti Cr improving , seems has pleateued   SVT broked with adenosine, now restarted back on betablocker which was held due to initial Bradycardia , continue hold clonidine,  monitor on telemetry, echo lvef wnl, grade II diastolic dysfunction, no wall motion abmormalities tsh 0.6,  Mag 1.4, replace mag  . Hyperkalemia mild but persistent,, continue bicarb supplement, low potassium diet, may need prn kayexalate.   . Prostate cancer [facility]) status post resection patient endorsing pelvic pain, CTab pel, ct chest did not report metastatic spread, psa 0.  . Protein-calorie malnutrition, severe , Prealbumin 8.4, nutrition consult Body mass index is 16.26 kg/m.   .. Anemia chronic : anemia work up pending, monitor, transfuse prn to keep hgb >7  H/o CAD s/p cabg in 2000, h/o valvular heart disease s/p  bioprosthetic valve replacement in 2005, unknown detail;  no chest pain, continue betablocker, asa, statin Echo lvef wnl, grade II diastolic dysfunction, no wall motion abmormalities He was recently evaluated by cardiology at [facility], no recommendation of ischemic work up  a dominant noncalcified nodule in the left lower lobe measuring 10 x 7 mm, mean 8.5 mm. Non-contrast chest CT at 3-6 months is recommended   FTT: patient has multiple hospitalization since august this year, with progressive weakness, will need snf placement  Depression and poor appetite; trial of remeron started  Overall poor prognosis: due to continued purulent drainage from enterovesical fistula, patient will have recurrent infection/sepsis, not a surgical candidate. General surgery and urology is to discuss any palliative approach, irrigation ? Palliative team consulted    DVT prophylaxis:    Lovenox 30 mg sub Q    Code Status:  DNR established on 10/22 after palliative care meeting    Family Communication:    Now patient finalized HPOA paper work , he appointed her daughter as his HPOA.    Disposition Plan:    SNF with palliative care continue following at snf, eventually may need hospice, as uti will not be able to be eradicated                                                  Consultants:  Urology  IR  Palliative care  Phone conversation with infectious disease Dr Linus Salmons  Procedures:  Nephrostomy tube exchange   Antibiotics:  Rocephin from admission to 10/22  Imipenem from 10/22   Objective: BP (!) 111/59 (BP Location: Right Arm)   Pulse 86   Temp 98.2 F (36.8 C) (Oral)   Resp 18   Ht 5' 8.5" (1.74 m)   Wt 49.2 kg (108 lb 8 oz)   SpO2 98%   BMI 16.26 kg/m   Intake/Output Summary (Last 24 hours) at 05/14/16 1115 Last data filed at 05/14/16 0630  Gross per 24 hour  Intake              360 ml  Output             1050 ml  Net             -690 ml   Filed Weights   05/07/16 1722 05/08/16 0231  Weight: 49 kg (108 lb) 49.2 kg  (108 lb 8 oz)    Exam:   General:  Frail , thin, oriented x3  Cardiovascular: RRR  Respiratory: CTABL  Abdomen: + watery stool in colostomy bag, + suprapubic catheter with purulent drainage, + bilateral nephrostomy tubes, generalized mild discomfort lower abdomen, suprapubic region, no guarding, no rebound, positive BS  Musculoskeletal: No Edema  Neuro: aaox3  Data Reviewed: Basic Metabolic Panel:  Recent Labs Lab 05/08/16 0405 05/10/16 0347 05/11/16 0329 05/12/16 0349 05/13/16 0535 05/14/16 0427  NA 133* 137 140 142 140 140  K 5.1 5.1 5.0 5.6* 5.3* 5.7*  CL 106 113* 115* 115* 113* 113*  CO2 19* 19* 21* 22 21* 22  GLUCOSE 161* 103* 105* 95 90 93  BUN 64* 57* 51* 43* 37* 37*  CREATININE 3.06* 2.59* 1.99* 1.91* 1.63* 2.13*  CALCIUM 8.7* 8.6* 8.6* 8.7* 8.8* 8.6*  MG 1.4* 2.1  --   --   --   --   PHOS 4.8*  --   --   --   --   --  Liver Function Tests:  Recent Labs Lab 05/07/16 1227 05/07/16 1944 05/08/16 0405 05/10/16 0347  AST 76* 73* 54* 29  ALT 71* 75* 57 35  ALKPHOS 122* 125 112 95  BILITOT 0.5 0.7 0.5 0.4  PROT 6.7 7.3 6.3* 5.8*  ALBUMIN 3.0* 2.7* 2.5* 2.2*    Recent Labs Lab 05/07/16 1944  LIPASE 27   No results for input(s): AMMONIA in the last 168 hours. CBC:  Recent Labs Lab 05/07/16 1944 05/08/16 0405 05/10/16 0347 05/11/16 0329 05/12/16 0349 05/14/16 0427  WBC 23.9* 21.2* 12.3* 13.7* 10.7* 10.6*  NEUTROABS 21.7*  --   --   --  8.8*  --   HGB 9.3* 8.4* 8.4* 8.6* 8.2* 8.3*  HCT 28.4* 25.5* 25.8* 26.4* 25.8* 26.4*  MCV 88.5 88.9 88.1 88.9 90.5 89.2  PLT 322 290 308 333 305 281   Cardiac Enzymes:    Recent Labs Lab 05/08/16 0405 05/08/16 1047 05/08/16 1434  TROPONINI <0.03 0.03* 0.03*   BNP (last 3 results) No results for input(s): BNP in the last 8760 hours.  ProBNP (last 3 results) No results for input(s): PROBNP in the last 8760 hours.  CBG: No results for input(s): GLUCAP in the last 168 hours.  Recent Results  (from the past 240 hour(s))  Blood culture (routine x 2)     Status: None   Collection Time: 05/07/16  5:01 PM  Result Value Ref Range Status   Specimen Description BLOOD RIGHT ARM  Final   Special Requests BOTTLES DRAWN AEROBIC AND ANAEROBIC 5CC  Final   Culture   Final    NO GROWTH 5 DAYS Performed at [facility]    Report Status 05/12/2016 FINAL  Final  MRSA PCR Screening     Status: None   Collection Time: 05/08/16  2:40 AM  Result Value Ref Range Status   MRSA by PCR NEGATIVE NEGATIVE Final    Comment:        The GeneXpert MRSA Assay (FDA approved for NASAL specimens only), is one component of a comprehensive MRSA colonization surveillance program. It is not intended to diagnose MRSA infection nor to guide or monitor treatment for MRSA infections.   Culture, blood (Routine X 2) w Reflex to ID Panel     Status: None   Collection Time: 05/08/16  4:05 AM  Result Value Ref Range Status   Specimen Description BLOOD LEFT ARM  Final   Special Requests BOTTLES DRAWN AEROBIC AND ANAEROBIC 5 CC  Final   Culture   Final    NO GROWTH 5 DAYS Performed at [facility]    Report Status 05/13/2016 FINAL  Final  C difficile quick scan w PCR reflex     Status: None   Collection Time: 05/09/16 10:02 AM  Result Value Ref Range Status   C Diff antigen NEGATIVE NEGATIVE Final   C Diff toxin NEGATIVE NEGATIVE Final   C Diff interpretation No C. difficile detected.  Final  Gastrointestinal Panel by PCR , Stool     Status: None   Collection Time: 05/09/16 10:02 AM  Result Value Ref Range Status   Campylobacter species NOT DETECTED NOT DETECTED Final   Plesimonas shigelloides NOT DETECTED NOT DETECTED Final   Salmonella species NOT DETECTED NOT DETECTED Final   Yersinia enterocolitica NOT DETECTED NOT DETECTED Final   Vibrio species NOT DETECTED NOT DETECTED Final   Vibrio cholerae NOT DETECTED NOT DETECTED Final   Enteroaggregative E coli (EAEC) NOT DETECTED NOT DETECTED  Final   Enteropathogenic E coli (EPEC) NOT DETECTED NOT DETECTED Final   Enterotoxigenic E coli (ETEC) NOT DETECTED NOT DETECTED Final   Shiga like toxin producing E coli (STEC) NOT DETECTED NOT DETECTED Final   Shigella/Enteroinvasive E coli (EIEC) NOT DETECTED NOT DETECTED Final   Cryptosporidium NOT DETECTED NOT DETECTED Final   Cyclospora cayetanensis NOT DETECTED NOT DETECTED Final   Entamoeba histolytica NOT DETECTED NOT DETECTED Final   Giardia lamblia NOT DETECTED NOT DETECTED Final   Adenovirus F40/41 NOT DETECTED NOT DETECTED Final   Astrovirus NOT DETECTED NOT DETECTED Final   Norovirus GI/GII NOT DETECTED NOT DETECTED Final   Rotavirus A NOT DETECTED NOT DETECTED Final   Sapovirus (I, II, IV, and V) NOT DETECTED NOT DETECTED Final  Culture, Urine     Status: Abnormal   Collection Time: 05/09/16 10:30 AM  Result Value Ref Range Status   Specimen Description URINE, SUPRAPUBIC  Final   Special Requests NONE  Final   Culture (A)  Final    >=100,000 COLONIES/mL ESCHERICHIA COLI Confirmed Extended Spectrum Beta-Lactamase Producer (ESBL) 80,000 COLONIES/mL ENTEROCOCCUS FAECIUM >=100,000 COLONIES/mL PSEUDOMONAS AERUGINOSA    Report Status 05/13/2016 FINAL  Final   Organism ID, Bacteria ESCHERICHIA COLI (A)  Final   Organism ID, Bacteria ENTEROCOCCUS FAECIUM (A)  Final   Organism ID, Bacteria PSEUDOMONAS AERUGINOSA (A)  Final      Susceptibility   Escherichia coli - MIC*    AMPICILLIN >=32 RESISTANT Resistant     CEFAZOLIN >=64 RESISTANT Resistant     CEFTRIAXONE >=64 RESISTANT Resistant     CIPROFLOXACIN >=4 RESISTANT Resistant     GENTAMICIN >=16 RESISTANT Resistant     IMIPENEM <=0.25 SENSITIVE Sensitive     NITROFURANTOIN <=16 SENSITIVE Sensitive     TRIMETH/SULFA >=320 RESISTANT Resistant     AMPICILLIN/SULBACTAM >=32 RESISTANT Resistant     PIP/TAZO >=128 RESISTANT Resistant     Extended ESBL POSITIVE Resistant     * >=100,000 COLONIES/mL ESCHERICHIA COLI    Enterococcus faecium - MIC*    AMPICILLIN >=32 RESISTANT Resistant     LEVOFLOXACIN >=8 RESISTANT Resistant     NITROFURANTOIN 64 INTERMEDIATE Intermediate     VANCOMYCIN 1 SENSITIVE Sensitive     LINEZOLID 2 SENSITIVE Sensitive     * 80,000 COLONIES/mL ENTEROCOCCUS FAECIUM   Pseudomonas aeruginosa - MIC*    CEFTAZIDIME 4 SENSITIVE Sensitive     CIPROFLOXACIN >=4 RESISTANT Resistant     GENTAMICIN <=1 SENSITIVE Sensitive     IMIPENEM 2 SENSITIVE Sensitive     PIP/TAZO 8 SENSITIVE Sensitive     CEFEPIME 2 SENSITIVE Sensitive     * >=100,000 COLONIES/mL PSEUDOMONAS AERUGINOSA  Culture, Urine     Status: Abnormal   Collection Time: 05/09/16 10:31 AM  Result Value Ref Range Status   Specimen Description KIDNEY RIGHT NEPHROSTOMY  Final   Special Requests NONE  Final   Culture (A)  Final    50,000 COLONIES/mL ESCHERICHIA COLI Confirmed Extended Spectrum Beta-Lactamase Producer (ESBL) SENSITIVTIES  REPEATED FOR CONFIRMATION >=100,000 COLONIES/mL YEAST Performed at [facility]    Report Status 05/13/2016 FINAL  Final   Organism ID, Bacteria ESCHERICHIA COLI (A)  Final      Susceptibility   Escherichia coli - MIC*    AMPICILLIN >=32 RESISTANT Resistant     CEFAZOLIN >=64 RESISTANT Resistant     CEFEPIME 4 RESISTANT Resistant     CEFTAZIDIME >=64 RESISTANT Resistant     CEFTRIAXONE >=64 RESISTANT Resistant  CIPROFLOXACIN >=4 RESISTANT Resistant     GENTAMICIN >=16 RESISTANT Resistant     IMIPENEM <=0.25 SENSITIVE Sensitive     TRIMETH/SULFA >=320 RESISTANT Resistant     AMPICILLIN/SULBACTAM >=32 RESISTANT Resistant     PIP/TAZO >=128 RESISTANT Resistant     Extended ESBL POSITIVE Resistant     * 50,000 COLONIES/mL ESCHERICHIA COLI  Culture, Urine     Status: Abnormal   Collection Time: 05/09/16 10:32 AM  Result Value Ref Range Status   Specimen Description KIDNEY LEFT NEPHROSTOMY  Final   Special Requests NONE  Final   Culture (A)  Final    40,000 COLONIES/mL  ESCHERICHIA COLI Confirmed Extended Spectrum Beta-Lactamase Producer (ESBL) SENSITIVITIES REPEATED FOR CONFIRMATION >=100,000 COLONIES/mL YEAST Performed at [facility]    Report Status 05/13/2016 FINAL  Final   Organism ID, Bacteria ESCHERICHIA COLI (A)  Final      Susceptibility   Escherichia coli - MIC*    AMPICILLIN >=32 RESISTANT Resistant     CEFAZOLIN >=64 RESISTANT Resistant     CEFEPIME 32 RESISTANT Resistant     CEFTAZIDIME >=64 RESISTANT Resistant     CEFTRIAXONE >=64 RESISTANT Resistant     CIPROFLOXACIN >=4 RESISTANT Resistant     GENTAMICIN >=16 RESISTANT Resistant     IMIPENEM <=0.25 SENSITIVE Sensitive     TRIMETH/SULFA >=320 RESISTANT Resistant     AMPICILLIN/SULBACTAM >=32 RESISTANT Resistant     PIP/TAZO >=128 RESISTANT Resistant     Extended ESBL POSITIVE Resistant     * 40,000 COLONIES/mL ESCHERICHIA COLI     Studies: No results found.  Scheduled Meds: . aspirin EC  81 mg Oral Q1200  . atorvastatin  20 mg Oral q1800  . enoxaparin (LOVENOX) injection  30 mg Subcutaneous Q24H  . famotidine  20 mg Oral QHS  . feeding supplement (NEPRO CARB STEADY)  237 mL Oral BID BM  . fluconazole  100 mg Oral Daily  . imipenem-cilastatin  500 mg Intravenous Q12H  . lactobacillus  1 g Oral TID WC  . metoprolol tartrate  12.5 mg Oral BID  . mirtazapine  7.5 mg Oral QHS  . sodium bicarbonate  650 mg Oral QID  . sodium chloride flush  3 mL Intravenous Q12H    Continuous Infusions:     Time spent: 50mins  Leeman Johnsey MD, PhD  [facility] Pager [phone]. If 7PM-7AM, please contact night-coverage at [email or web address], password [facility] 05/14/2016, 11:15 AM  LOS: 6 days

## 2016-05-14 NOTE — NC FL2 (Signed)
[place] MEDICAID FL2 LEVEL OF CARE SCREENING TOOL     IDENTIFICATION  Patient Name: Joseph Carrillo Birthdate: 1926-07-24 Sex: male Admission Date (Current Location): 05/07/2016  [facility] and [place] Number:  Herbalist and Address:  [facility],  [place] [address], [place]      Provider Number: [id number]  Attending Physician Name and Address:  Florencia Reasons, MD  Relative Name and Phone Number:       Current Level of Care: Hospital Recommended Level of Care: [place] Prior Approval Number:    Date Approved/Denied:   PASRR Number:  ( [id number] )  Discharge Plan: SNF    Current Diagnoses: Patient Active Problem List   Diagnosis Date Noted  . Acute on chronic renal failure ([place]) 05/08/2016  . Acute renal failure (ARF) ([place]) 05/08/2016  . Hydronephrosis, bilateral   . Protein-calorie malnutrition, severe 03/30/2016  . Renal failure (ARF), acute on chronic (HCC)   . Hyperkalemia 03/28/2016  . CKD (chronic kidney disease) 03/28/2016  . Suprapubic catheter ([place]) 03/28/2016  . UTI (urinary tract infection) 03/28/2016  . Leukocytosis 03/28/2016  . Anemia 03/28/2016  . History of urostomy 03/22/2016  . Colostomy in place [facility]) 03/22/2016  . Underweight 03/22/2016  . Prostate cancer ([place]) 03/22/2016  . Abdominal pain, chronic, epigastric 03/22/2016  . Physical debility 03/22/2016    Orientation RESPIRATION BLADDER Height & Weight     Self  Normal Incontinent, External catheter Weight: 108 lb 8 oz (49.2 kg) Height:  5' 8.5" (174 cm)  BEHAVIORAL SYMPTOMS/MOOD NEUROLOGICAL BOWEL NUTRITION STATUS   (NONE )  (NONE ) Incontinent Diet (Renal w/ fluid restriction 1249ml)  AMBULATORY STATUS COMMUNICATION OF NEEDS Skin   Extensive Assist Verbally Normal                       Personal Care Assistance Level of Assistance  Bathing, Feeding, Dressing Bathing Assistance: Limited assistance Feeding assistance: Limited  assistance Dressing Assistance: Limited assistance     Functional Limitations Info  Speech, Hearing, Sight Sight Info: Adequate Hearing Info: Adequate Speech Info: Adequate    SPECIAL CARE FACTORS FREQUENCY  PT (By licensed PT), OT (By licensed OT)     PT Frequency: 3 OT Frequency: 2            Contractures      Additional Factors Info  Code Status, Allergies Code Status Info: DNR CODE  Allergies Info: N/A           Current Medications (05/14/2016):  This is the current hospital active medication list Current Facility-Administered Medications  Medication Dose Route Frequency Provider Last Rate Last Dose  . acetaminophen (TYLENOL) tablet 650 mg  650 mg Oral Q6H PRN Toy Baker, MD   650 mg at 05/10/16 1039   Or  . acetaminophen (TYLENOL) suppository 650 mg  650 mg Rectal Q6H PRN Toy Baker, MD      . albuterol (PROVENTIL) (2.5 MG/3ML) 0.083% nebulizer solution 2.5 mg  2.5 mg Nebulization Q6H PRN Toy Baker, MD      . aspirin EC tablet 81 mg  81 mg Oral Q1200 Toy Baker, MD   81 mg at 05/13/16 1244  . atorvastatin (LIPITOR) tablet 20 mg  20 mg Oral q1800 Toy Baker, MD   20 mg at 05/13/16 1818  . enoxaparin (LOVENOX) injection 30 mg  30 mg Subcutaneous Q24H Toy Baker, MD   30 mg at 05/13/16 1246  . famotidine (PEPCID) tablet 20 mg  20 mg Oral QHS Florencia Reasons, MD   20 mg at 05/13/16 2255  . feeding supplement (NEPRO CARB STEADY) liquid 237 mL  237 mL Oral BID BM Nita Sells, MD   237 mL at 05/14/16 1039  . fluconazole (DIFLUCAN) tablet 100 mg  100 mg Oral Daily Florencia Reasons, MD   100 mg at 05/14/16 1040  . HYDROcodone-acetaminophen (NORCO/VICODIN) 5-325 MG per tablet 1 tablet  1 tablet Oral Q6H PRN Florencia Reasons, MD   1 tablet at 05/13/16 2305  . imipenem-cilastatin (PRIMAXIN) 500 mg in sodium chloride 0.9 % 100 mL IVPB  500 mg Intravenous Q12H Nikola Glogovac, RPH   500 mg at 05/14/16 0453  . lactobacillus (FLORANEX/LACTINEX)  granules 1 g  1 g Oral TID WC Florencia Reasons, MD   1 g at 05/14/16 1040  . metoprolol tartrate (LOPRESSOR) tablet 12.5 mg  12.5 mg Oral BID Nita Sells, MD   12.5 mg at 05/14/16 1040  . mirtazapine (REMERON) tablet 7.5 mg  7.5 mg Oral QHS Florencia Reasons, MD   7.5 mg at 05/13/16 2250  . ondansetron (ZOFRAN) tablet 4 mg  4 mg Oral Q6H PRN Toy Baker, MD       Or  . ondansetron (ZOFRAN) injection 4 mg  4 mg Intravenous Q6H PRN Toy Baker, MD      . sodium bicarbonate tablet 650 mg  650 mg Oral QID Toy Baker, MD   650 mg at 05/14/16 1040  . sodium chloride flush (NS) 0.9 % injection 3 mL  3 mL Intravenous Q12H Toy Baker, MD   3 mL at 05/12/16 2125     Discharge Medications: Please see discharge summary for a list of discharge medications.  Relevant Imaging Results:  Relevant Lab Results:   Additional Information SSN: [id number] Palliative Care to follow at SNF.   Glendon Axe A

## 2016-05-14 NOTE — Clinical Social Work Note (Signed)
Patient was at [facility] from 9/14- 10/5 and is now in co pay days.   Dtr prefers [facility] who confirmed co pays are $160 per day. Facility rep, Rollene Fare would like to discuss specifics with dtr, contact information provided to dtr via email.  MSW also contacted [employer] and [place] who is willing to await approval of Medicaid, however family would be financially responsible if Medicaid is denied.   MSW also contacted [facility] who is willing to arrange payment plan.   Dtr will need to contact facilities for additional information as needed. Contact names and numbers provided with the above information via email.   MSW remains available as needed.   Glendon Axe, MSW [phone] 05/14/2016 1:53 PM

## 2016-05-14 NOTE — Progress Notes (Signed)
Patient ID: Joseph Carrillo, male   DOB: [birth year], 82 y.o.   MRN: [id number]  [facility] Surgery Progress Note     Subjective: No new complaints. Palliative saw patient over the weekend.  Objective: Vital signs in last 24 hours: Temp:  [98 F (36.7 C)-98.2 F (36.8 C)] 98.2 F (36.8 C) (10/23 0550) Pulse Rate:  [54-86] 86 (10/23 0550) Resp:  [18] 18 (10/23 0550) BP: (111-144)/(46-59) 111/59 (10/23 0550) SpO2:  [98 %-100 %] 98 % (10/23 0550) Last BM Date: 05/11/16  Intake/Output from previous day: 10/22 0701 - 10/23 0700 In: 580 [P.O.:460; IV Piggyback:100] Out: 1370 [Urine:1120; Stool:250] Intake/Output this shift: No intake/output data recorded.  PE: Gen:  Alert, NAD, pleasant Pulm:  Effort normal Abd: Soft, NT/ND, liquid stool and air in bag, suprapubic catheter with purulent drainage, bilateral nephrostomy tubes with urine in bags   Lab Results:   Recent Labs  05/12/16 0349 05/14/16 0427  WBC 10.7* 10.6*  HGB 8.2* 8.3*  HCT 25.8* 26.4*  PLT 305 281   BMET  Recent Labs  05/13/16 0535 05/14/16 0427  NA 140 140  K 5.3* 5.7*  CL 113* 113*  CO2 21* 22  GLUCOSE 90 93  BUN 37* 37*  CREATININE 1.63* 2.13*  CALCIUM 8.8* 8.6*   PT/INR No results for input(s): LABPROT, INR in the last 72 hours. CMP     Component Value Date/Time   NA 140 05/14/2016 0427   K 5.7 (H) 05/14/2016 0427   CL 113 (H) 05/14/2016 0427   CO2 22 05/14/2016 0427   GLUCOSE 93 05/14/2016 0427   BUN 37 (H) 05/14/2016 0427   CREATININE 2.13 (H) 05/14/2016 0427   CALCIUM 8.6 (L) 05/14/2016 0427   PROT 5.8 (L) 05/10/2016 0347   ALBUMIN 2.2 (L) 05/10/2016 0347   AST 29 05/10/2016 0347   ALT 35 05/10/2016 0347   ALKPHOS 95 05/10/2016 0347   BILITOT 0.4 05/10/2016 0347   GFRNONAA 26 (L) 05/14/2016 0427   GFRAA 31 (L) 05/14/2016 0427   Lipase     Component Value Date/Time   LIPASE 27 05/07/2016 1944       Studies/Results: No results  found.  Anti-infectives: Anti-infectives    Start     Dose/Rate Route Frequency Ordered Stop   05/14/16 1000  fluconazole (DIFLUCAN) tablet 100 mg     100 mg Oral Daily 05/13/16 1513 05/18/16 0959   05/13/16 1600  fluconazole (DIFLUCAN) IVPB 200 mg     200 mg 100 mL/hr over 60 Minutes Intravenous  Once 05/13/16 1512 05/13/16 1918   05/13/16 1600  imipenem-cilastatin (PRIMAXIN) 500 mg in sodium chloride 0.9 % 100 mL IVPB     500 mg 200 mL/hr over 30 Minutes Intravenous Every 12 hours 05/13/16 1519     05/08/16 2100  cefTRIAXone (ROCEPHIN) 1 g in dextrose 5 % 50 mL IVPB  Status:  Discontinued     1 g 100 mL/hr over 30 Minutes Intravenous Every 24 hours 05/08/16 0234 05/13/16 1512   05/07/16 2100  cefTRIAXone (ROCEPHIN) 1 g in dextrose 5 % 50 mL IVPB     1 g 100 mL/hr over 30 Minutes Intravenous  Once 05/07/16 2057 05/07/16 2357       Assessment/Plan Prostate cancer s/p radiation  Obstructive uropathy - S/p bilateral nephrostomy tubes 03/2016 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 Ostomy functioning. - Cystogram showed: large fistulous communication between posterior bladder and rectum with retrograde filling of sigmoid colon. - per Dr. Hassell Done  not a candidate for surgery on this fistula - ?irrigants for bladder/rectum >> per urology  Plan - not a surgical candidate. Palliative care consulted, eventual SNF with palliative care continue following at SNF, eventually may need hospice. General surgery will likely sign off.   LOS: 6 days    Jerrye Beavers , [facility] Surgery 05/14/2016, 10:59 AM Pager: [phone] Consults: [phone] Mon-Fri 7:00 am-4:30 pm Sat-Sun 7:00 am-11:30 am

## 2016-05-14 NOTE — Clinical Social Work Placement (Signed)
   CLINICAL SOCIAL WORK PLACEMENT  NOTE  Date:  05/14/2016  Patient Details  Name: Joseph Carrillo MRN: [id number] Date of Birth: 04/10/1927  Clinical Social Work is seeking post-discharge placement for this patient at the [place] level of care (*CSW will initial, date and re-position this form in  chart as items are completed):  Yes   Patient/family provided with [place] Work Department's list of facilities offering this level of care within the geographic area requested by the patient (or if unable, by the patient's family).  Yes   Patient/family informed of their freedom to choose among providers that offer the needed level of care, that participate in Medicare, Medicaid or managed care program needed by the patient, have an available bed and are willing to accept the patient.  Yes   Patient/family informed of [place]'s ownership interest in [facility] and [facility], as well as of the fact that they are under no obligation to receive care at these facilities.  PASRR submitted to EDS on 05/14/16     PASRR number received on       Existing PASRR number confirmed on 05/14/16     FL2 transmitted to all facilities in geographic area requested by pt/family on 05/14/16     FL2 transmitted to all facilities within larger geographic area on       Patient informed that his/her managed care company has contracts with or will negotiate with certain facilities, including the following:            Patient/family informed of bed offers received.  Patient chooses bed at       Physician recommends and patient chooses bed at      Patient to be transferred to   on  .  Patient to be transferred to facility by       Patient family notified on   of transfer.  Name of family member notified:        PHYSICIAN Please sign FL2     Additional Comment:    _______________________________________________ Glendon Axe A 05/14/2016, 10:43  AM

## 2016-05-14 NOTE — Progress Notes (Signed)
Physical Therapy Treatment Patient Details Name: Joseph Carrillo MRN: [id number] DOB: 02/08/1927 Today's Date: 05/14/2016    History of Present Illness 82 y.o. male admitted with fatigue and abnormal blood work. Dx of hydroureteronephrosis, ARF, hyperkalemia, UTI.  PMH obstructive uropathy, urostomy, colostomy, prostate cancer, CAD, CABG, GERD.     PT Comments    Pt was seen in bed upon arrival. Patient performed bed mobility requiring increased time and VC's to watch for various lines. Patient ambulated 80 ft in hallway with VC's to facilitate pacing and awareness of surroundings to decrease risk of falls.  Patient portrayed a flat effect during today's session.   Follow Up Recommendations  SNF     Equipment Recommendations  Rolling walker with 5" wheels    Recommendations for Other Services       Precautions / Restrictions Precautions Precautions: Fall Precaution Comments: multiple drains Restrictions Weight Bearing Restrictions: No    Mobility  Bed Mobility Overal bed mobility: Needs Assistance Bed Mobility: Supine to Sit     Supine to sit: Min guard     General bed mobility comments: increased time and caution to multiple lines, tubes  Transfers Overall transfer level: Needs assistance Equipment used: Rolling walker (2 wheeled) Transfers: Sit to/from Stand Sit to Stand: Min guard;+2 safety/equipment         General transfer comment: Pt. required 25% VC's to reach back for chair before sitting. Required VC's for awareness of surroundings when ambulating with walker to avoid falls.   Ambulation/Gait Ambulation/Gait assistance: Min guard Ambulation Distance (Feet): 80 Feet Assistive device: Rolling walker (2 wheeled) Gait Pattern/deviations: Step-through pattern;Decreased step length - right;Decreased step length - left;Decreased stride length;Trunk flexed;Narrow base of support Gait velocity: decreased Gait velocity interpretation: Below normal speed for  age/gender General Gait Details: Tolerated increased distance with slower pace. < 25% assist to maneuver walker around obsticles.  Min c/o fatigue.    Stairs            Wheelchair Mobility    Modified Rankin (Stroke Patients Only)       Balance                                    Cognition Arousal/Alertness: Awake/alert Behavior During Therapy: WFL for tasks assessed/performed Overall Cognitive Status: Within Functional Limits for tasks assessed                      Exercises    General Comments        Pertinent Vitals/Pain Pain Assessment: No/denies pain Pain Location: patient stated he kept feeling a pulling sensation near neph drain while moving around in bed.  Pain Intervention(s): Monitored during session;Repositioned    Home Living                      Prior Function            PT Goals (current goals can now be found in the care plan section) Progress towards PT goals: Progressing toward goals    Frequency    Min 3X/week      PT Plan Current plan remains appropriate    Co-evaluation             End of Session Equipment Utilized During Treatment: Gait belt Activity Tolerance: Patient limited by fatigue;No increased pain Patient left: in chair;with call bell/phone within reach     Time: 1502-1529  PT Time Calculation (min) (ACUTE ONLY): 27 min  Charges:  $Gait Training: 8-22 mins $Therapeutic Activity: 8-22 mins                    G Codes:      Hall Busing, SPTA [facility] Acute Rehab [phone]  Present during session and agree with above Rica Koyanagi  PTA [facility]  Acute  Rehab Pager      [phone]

## 2016-05-14 NOTE — Clinical Social Work Note (Signed)
Clinical Social Work Assessment  Patient Details  Name: Joseph Carrillo MRN: [id number] Date of Birth: 04-10-1927  Date of referral:  05/14/16               Reason for consult:  Facility Placement, Discharge Planning                Permission sought to share information with:  Family Supports, Customer service manager, Case Optician, dispensing granted to share information::  Yes, Verbal Permission Granted  Name::      Theola Sequin)  Agency::   (SNF's )  Relationship::   (Daughter )  Contact Information:   [phone])  Housing/Transportation Living arrangements for the past 2 months:  Single Family Home Source of Information:  Adult Children Patient Interpreter Needed:  None Criminal Activity/Legal Involvement Pertinent to Current Situation/Hospitalization:  No - Comment as needed Significant Relationships:  Adult Children Lives with:  Adult Children Do you feel safe going back to the place where you live?  No Need for family participation in patient care:  Yes (Comment)  Care giving concerns:  Patient admitted from home with dtr, [name], PT recommending SNF placement.   Social Worker assessment / plan:  MSW met with patient and dtr, [name] present at bedside in reference to post-acute placement. MSW introduced MSW role and SNF placement. Patient's dtr reported patient being a ST resident of [facility] in the past however would prefer [facility] because it is closer to home. Dtr reported that she has applied for Medicaid. MSW explained Medicaid process. MSW has contacted [facility] to review referral and confirm days. No further concerns reported at this time.   MD recommending Palliative Care to follow patient at facility.   MSW remains available as needed.   Employment status:  Retired Forensic scientist:  Managed Care PT Recommendations:  [place] / Referral to community resources:  [place]  Patient/Family's  Response to care:  Patient alert however did not participate in assessment. Pt's dtr agreeable to SNF placement and would ultimately prefer long term care. Dtr supportive and appreciated social work intervention.   Patient/Family's Understanding of and Emotional Response to Diagnosis, Current Treatment, and Prognosis:  Dtr aware of medical interventions.  Emotional Assessment Appearance:  Appears stated age Attitude/Demeanor/Rapport:  Unable to Assess Affect (typically observed):  Unable to Assess Orientation:  Oriented to Self Alcohol / Substance use:  Not Applicable Psych involvement (Current and /or in the community):  No (Comment)  Discharge Needs  Concerns to be addressed:  Denies Needs/Concerns at this time Readmission within the last 30 days:  No Current discharge risk:  Dependent with Mobility Barriers to Discharge:  Continued Medical Work up   [employer], MSW [phone] 05/14/2016 11:35 AM

## 2016-05-14 NOTE — Progress Notes (Signed)
I reviewed chart Not a surgical candidate Has fistula and recurrent UTI Irrigate s/p tube and perc tubes PRN  1. Please speak to IR about assessing the left perc tube: he had left hydro and it is sore when he turns over in bed; it is draining well into the bag;  2. Speak to IR and perhaps change both tubes on this admission (inserted Sept 11) 3. Recognize that urine c/s will be chronically positive treat when symptomatic  WE CALLED IR AND PUT IN ORDER

## 2016-05-14 NOTE — Progress Notes (Signed)
Occupational Therapy Treatment Patient Details Name: Joseph Carrillo MRN: [id number] DOB: [birth year] Today's Date: 05/14/2016    History of present illness 82 y.o. male admitted with fatigue and abnormal blood work. Dx of hydroureteronephrosis, ARF, hyperkalemia, UTI.  PMH obstructive uropathy, urostomy, colostomy, prostate cancer, CAD, CABG, GERD.    OT comments  Pt agreed to BUE exercises - goal added  Follow Up Recommendations  SNF          Precautions / Restrictions Precautions Precautions: Fall Precaution Comments: multiple drains Restrictions Weight Bearing Restrictions: No       Mobility Bed Mobility          NT        Transfers            NT              ADL Overall ADL's : Needs assistance/impaired Eating/Feeding: Bed level;Set up Eating/Feeding Details (indicate cue type and reason): HOB raised Grooming: Set up;Bed level Grooming Details (indicate cue type and reason): HOB raised                               General ADL Comments: Pt agreed to BUE exercises in bed with HOB raised                Cognition   Behavior During Therapy: [facility] for tasks assessed/performed Overall Cognitive Status: Within Functional Limits for tasks assessed                         Exercises General Exercises - Upper Extremity Shoulder Flexion: AROM;Both;20 reps Shoulder ABduction: AROM;20 reps;Both Shoulder ADduction: 20 reps;AROM Elbow Flexion: AROM;Both;20 reps Elbow Extension: AROM;20 reps;Both   Shoulder Instructions            Pertinent Vitals/ Pain       Pain Assessment: No/denies pain            Progress Toward Goals  OT Goals(current goals can now be found in the care plan section)  Progress towards OT goals: Progressing toward goals  ADL Goals Pt/caregiver will Perform Home Exercise Program: Both right and left upper extremity;With written HEP provided;With Supervision  Plan Discharge plan needs to be updated        End of Session     Activity Tolerance Patient tolerated treatment well   Patient Left in bed;with call bell/phone within reach;with bed alarm set   Nurse Communication Mobility status           Charges: OT General Charges $OT Visit: 1 Procedure OT Treatments $Therapeutic Exercise: 8-22 mins  Ameet Sandy, Thereasa Parkin 05/14/2016, 3:35 PM

## 2016-05-15 ENCOUNTER — Inpatient Hospital Stay (HOSPITAL_COMMUNITY): Payer: Medicare Other

## 2016-05-15 DIAGNOSIS — R627 Adult failure to thrive: Secondary | ICD-10-CM

## 2016-05-15 HISTORY — PX: IR GENERIC HISTORICAL: IMG1180011

## 2016-05-15 LAB — BASIC METABOLIC PANEL
Anion gap: 5 (ref 5–15)
BUN: 39 mg/dL — AB (ref 6–20)
CO2: 22 mmol/L (ref 22–32)
Calcium: 8.6 mg/dL — ABNORMAL LOW (ref 8.9–10.3)
Chloride: 114 mmol/L — ABNORMAL HIGH (ref 101–111)
Creatinine, Ser: 2.06 mg/dL — ABNORMAL HIGH (ref 0.61–1.24)
GFR calc Af Amer: 32 mL/min — ABNORMAL LOW (ref >60)
GFR, EST NON AFRICAN AMERICAN: 28 mL/min — AB (ref >60)
Glucose, Bld: 92 mg/dL (ref 65–99)
POTASSIUM: 5.7 mmol/L — AB (ref 3.5–5.1)
SODIUM: 141 mmol/L (ref 135–145)

## 2016-05-15 LAB — MAGNESIUM: MAGNESIUM: 1.4 mg/dL — AB (ref 1.7–2.4)

## 2016-05-15 LAB — CBC
HEMATOCRIT: 24.2 % — AB (ref 39.0–52.0)
HEMOGLOBIN: 7.5 g/dL — AB (ref 13.0–17.0)
MCH: 28.8 pg (ref 26.0–34.0)
MCHC: 31 g/dL (ref 30.0–36.0)
MCV: 93.1 fL (ref 78.0–100.0)
Platelets: 220 10*3/uL (ref 150–400)
RBC: 2.6 MIL/uL — ABNORMAL LOW (ref 4.22–5.81)
RDW: 17.1 % — ABNORMAL HIGH (ref 11.5–15.5)
WBC: 10 10*3/uL (ref 4.0–10.5)

## 2016-05-15 LAB — CORTISOL: Cortisol, Plasma: 12.1 ug/dL

## 2016-05-15 MED ORDER — LIDOCAINE HCL 1 % IJ SOLN
INTRAMUSCULAR | Status: AC
  Filled 2016-05-15: qty 20

## 2016-05-15 MED ORDER — FLUCONAZOLE 100 MG PO TABS: 100.0000 mg | ORAL_TABLET | Freq: Every day | ORAL | 0 refills | Status: AC

## 2016-05-15 MED ORDER — CEPHALEXIN 500 MG PO CAPS: 500.0000 mg | ORAL_CAPSULE | Freq: Every day | ORAL | 0 refills | Status: AC

## 2016-05-15 MED ORDER — HYDROCODONE-ACETAMINOPHEN 5-325 MG PO TABS: 1.0000 | ORAL_TABLET | Freq: Four times a day (QID) | ORAL | 0 refills | Status: DC | PRN

## 2016-05-15 MED ORDER — NEPRO/CARBSTEADY PO LIQD: 237.0000 mL | ORAL | 0 refills | Status: AC

## 2016-05-15 MED ORDER — ACETAMINOPHEN 325 MG PO TABS: 650.0000 mg | ORAL_TABLET | Freq: Four times a day (QID) | ORAL | 0 refills | Status: AC | PRN

## 2016-05-15 MED ORDER — NEPRO/CARBSTEADY PO LIQD: 237.0000 mL | ORAL | Status: DC

## 2016-05-15 NOTE — Procedures (Signed)
Interventional Radiology Procedure Note  Procedure:  Bilateral nephrostograms and nephrostomy tube changes  Complications: None   Estimated Blood Loss: None  Findings:  Chronic bilateral hydronephrosis and hydroureter with poor/sluggish flow into bladder.  Presence of hydro is not a good indicator of whether nephrostomy tubes are functioning well and should be based on urine output.  New bilateral 10 Fr PCN's advanced over guidewires.  Recommend routine PCN exchange in 6-8 weeks.  Venetia Night. Kathlene Cote, M.D Pager:  [phone]

## 2016-05-15 NOTE — Clinical Social Work Placement (Signed)
EDD: 10/24. Patient's dtr, [name] has toured [facility] and agreeable to placement once patient medically stable for dc.   No further concerns reported at this time.   CLINICAL SOCIAL WORK PLACEMENT  NOTE  Date:  05/15/2016  Patient Details  Name: Joseph Carrillo MRN: [id number] Date of Birth: [birth year]  Clinical Social Work is seeking post-discharge placement for this patient at the [place] level of care (*CSW will initial, date and re-position this form in  chart as items are completed):  Yes   Patient/family provided with [place] Work Department's list of facilities offering this level of care within the geographic area requested by the patient (or if unable, by the patient's family).  Yes   Patient/family informed of their freedom to choose among providers that offer the needed level of care, that participate in Medicare, Medicaid or managed care program needed by the patient, have an available bed and are willing to accept the patient.  Yes   Patient/family informed of [place]'s ownership interest in [facility] and [facility], as well as of the fact that they are under no obligation to receive care at these facilities.  PASRR submitted to EDS on 05/14/16     PASRR number received on       Existing PASRR number confirmed on 05/14/16     FL2 transmitted to all facilities in geographic area requested by pt/family on 05/14/16     FL2 transmitted to all facilities within larger geographic area on       Patient informed that his/her managed care company has contracts with or will negotiate with certain facilities, including the following:        Yes   Patient/family informed of bed offers received.  Patient chooses bed at  [facility] )     Physician recommends and patient chooses bed at      Patient to be transferred to  [facility] ) on 05/15/16.  Patient to be transferred to  facility by  [facility] )     Patient family notified on 05/15/16 of transfer.  Name of family member notified:   (Pt's dtr, [name])     PHYSICIAN Please sign FL2, Please prepare priority discharge summary, including medications     Additional Comment:    _______________________________________________ Glendon Axe A 05/15/2016, 11:35 AM

## 2016-05-15 NOTE — Discharge Summary (Addendum)
Discharge Summary  Joseph Carrillo [id number] DOB: 17-Apr-1927  PCP: [name], MD  Admit date: 05/07/2016 Discharge date: 05/15/2016  Time spent: >61mins  Recommendations for Outpatient Follow-up:  1. F/u with SNF MD for  hospital discharge follow up, repeat cbc/bmp at follow up 2. Continue follow up with palliative care at SNF, transition to hospice service when patient and family ready to make the change 3. Will need to have bilateral nephrostomy tube changed periodically if patient does not transition to hospice care in the next few weeks, please contact interventional radiology for questions regarding nephrostomy tube. 4. Saline irrigation suprapubic catheter q shift, please contact urology for suprapubic catheter care   Discharge Diagnoses:  Active Hospital Problems   Diagnosis Date Noted  . Acute on chronic renal failure ([place]) 05/08/2016  . Acute renal failure (ARF) ([place]) 05/08/2016  . Protein-calorie malnutrition, severe 03/30/2016  . Anemia 03/28/2016  . Hyperkalemia 03/28/2016  . Leukocytosis 03/28/2016  . UTI (urinary tract infection) 03/28/2016  . Colostomy in place [facility]) 03/22/2016  . History of urostomy 03/22/2016  . Prostate cancer ([place]) 03/22/2016    Resolved Hospital Problems   Diagnosis Date Noted Date Resolved  No resolved problems to display.    Discharge Condition: stable  Diet recommendation: low potassium diet  Filed Weights   05/07/16 1722 05/08/16 0231  Weight: 49 kg (108 lb) 49.2 kg (108 lb 8 oz)    History of present illness:  Chief Complaint: Fatigue abnormal blood work  HPI: Joseph Carrillo is a 82 y.o. 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 Been having foul-smelling urine patient had his labs checked today by PCP was noted to have leukocytosis up to 22.4 creatinine 3.19 and potassium 5.3 he was instructed to present to emerge department patient himself unable to provide detailed history. He apparently was having some vague abdominal discomfort Regarding pertinent Chronic problems: Patient in the past have had admissions for UTI patient has been admitted in the past to [facility] was discharged to rehabilitation and then came to leave his sister in [place] Recently was admitted to [facility] for presumed UTI associated with hyperkalemia with potassium up to 7.5 and worsening renal function. He was found to have bilateral hydronephrosis and was transferred to [facility] was seen in consult by urology. At baseline patient tracking this 2.0 patient undergone nephrostomy tube placement and his creatinine has improved to the time of discharge was down to 2.2. History of prostate cancer requiring extensive surgical procedures a suprapubic catheter placement and ostomy bag. In the past urine culture only grew multiple bacteria colonies suggestive of contamination. He was discharged on September 14 to nursing home facility and was discharged on October 4  IN ER:  Temp (24hrs), Avg:97.9 F (36.6 C), Min:97.5 F (36.4 C), Max:98.6 F (37 C)   Heartrate as well as 4370 had percent R median blood pressure 102/43  WBC 23.9 hemoglobin 9.3 Sodium 133 potassium 5.3 BUN 64 creatinine 3.13 albumin 2.7  Chest x-ray initially helpful showed  pneumomediastinum but this was not confirmed by CT no evidence of infection Following Medications were ordered in ER: Medications  sodium chloride 0.9 % bolus 1,000 mL (not administered)  cefTRIAXone (ROCEPHIN)  1 g in dextrose 5 % 50 mL IVPB (0 g Intravenous Stopped 05/07/16 2357)      Hospitalist was called for admission for Acute on chronic renal failure and possible UTI   Hospital Course:  Active Problems:   History of urostomy   Colostomy in place [facility])   Prostate cancer ([place])   Hyperkalemia   UTI (urinary tract infection)   Leukocytosis   Anemia   Protein-calorie malnutrition, severe   Acute on chronic renal failure (HCC)   Acute renal failure (ARF) ([place])   82 y.o. 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  Ct chest no penumomediastinum, no pna,  CT abdomen showed bilateral nephrostomy tubes, left sided hydrouretonephrosis and some mobile stone in the bladder Blood culture no growth,  urine culture obtained from bilateral nephrostomy tube and suprapubic catheter all grew ecoli (ESBL), pseudomonas, enterococcus, yeast  mrsa screen negative, stool pcr panel negative, c diff negative He was treated with rocephin initially this is changed to imipenem due to ESBL in urine culture, added diflucan uti will not be eradicated due to enterovesicular fistula, palliative care, and hospice care when patient and family are ready   Enterovesicular fistula, s/p colostomy and suprapubic catherter Suprapubic catheter drainage remain purulent and malodorous,  Cystogram showed: large fistulous communication between posterior bladder and rectum with retrograde filling of sigmoid colon. General surgery input appreciated, per general surgery Dr Hassell Done, patient is not a surgical candidate, he will discuss with urology regarding palliative irrigation. He agreed to palliative care  consult.  Saline  irrigation of suprapubic catheter as a temporizing measure. I have also talked to infectious disease Dr comer over the phone who recommends oral diflucan and keflex for uti suppression.  Obstructive uropathy: s/p bilateral nephrostomy tube placement from prior hospitalization, ct ab showed persistent moderate left sided hydroureteronephrosis,  Bilateral nephrostomy tube exchanged on 10/24    Acute on chronic renal failure (HCC) (unclear baseline cr, patient report required dialysis at some point in the past). continue hydration,defer  ir and urology for obstructive uropathy management, treat  uti Cr improving , seems has pleateued   SVT broked with adenosine, now restarted back on betablocker which was held due to initial Bradycardia , continue hold clonidine,  monitor on telemetry, echo lvef wnl, grade II diastolic dysfunction, no wall motion abmormalities tsh 0.6,  Mag 1.4, replaced mag Patient is discharged on lopressor only. He does has tendency to have bradycardia into the 50's.   Marland KitchenHyperkalemiamild but persistent,, continue bicarb supplement, low potassium diet, may need prn kayexalate.   .Prostate cancer (HCC)status post resection patient endorsing pelvic pain, CTab pel, ct chest did not report metastatic spread, psa 0.  .Protein-calorie malnutrition, severe, Prealbumin 8.4, nutrition consult Body mass index is 16.26 kg/m. Nutrition supplement.   Marland Kitchen.Anemiachronic : anemia work up reveal iron deficiency in addition to anemia of chronic disease. Avoid iron supplement In the setting of ongoing infection  H/o CAD s/p cabg in 2000, h/o valvular heart disease s/p bioprosthetic valve replacement in 2005, unknown detail; no chest pain,  continue betablocker, asa, statin Echo lvef wnl, grade II diastolic dysfunction, no wall motion abmormalities He was recently evaluated by cardiology at [facility], no recommendation of ischemic work up  a dominant  noncalcified nodule in the left lower lobe measuring 10 x 7 mm, mean 8.5 mm. Non-contrast chest CT at 3-6 months is recommended if patient is not on hospice care .  FTT: patient has multiple hospitalization since august this year, with progressive weakness,  snf placement Overall poor prognosis: due to continued purulent drainage from enterovesical fistula, patient will have recurrent infection/sepsis, not a surgical candidate. Palliative team consulted, now DNR, eventually transition to hospice care    Code Status:DNR established on 10/22 after palliative care meeting   Family Communication: Now patient finalized HPOA paper work , he appointed her daughter as his HPOA.   Disposition Plan: SNF with palliative care continue following at snf, eventually may need hospice, as uti will not be able to be eradicated    Consultants:  Urology  Interventional Radiology  Palliative care  Phone conversation with infectious disease Dr Linus Salmons  Procedures:  Bilateral Nephrostomy tube exchange by interventional radiology on 10/24  Antibiotics:  Rocephin from admission to 10/22  Imipenem from 10/22   Discharge Exam: BP (!) 128/42   Pulse (!) 55   Temp 98.3 F (36.8 C)   Resp 18   Ht 5' 8.5" (1.74 m)   Wt 49.2 kg (108 lb 8 oz)   SpO2 100%   BMI 16.26 kg/m     General:  Frail , thin, oriented x3  Cardiovascular: RRR  Respiratory: CTABL  Abdomen: + watery stool in colostomy bag, + suprapubic catheter with purulent drainage, + bilateral nephrostomy tubes, generalized mild discomfort lower abdomen, suprapubic region, no guarding, no rebound, positive BS  Musculoskeletal: No Edema  Neuro: aaox3   Discharge Instructions You were cared for by a hospitalist during your hospital stay. If you have any questions about your discharge medications or the care you received while you were in the hospital after you are discharged, you can call  the unit and asked to speak with the hospitalist on call if the hospitalist that took care of you is not available. Once you are discharged, your primary care physician will handle any further medical issues. Please note that NO REFILLS for any discharge medications will be authorized once you are discharged, as it is imperative that you return to your primary care physician (or establish a relationship with a primary care physician if you do not have one) for your aftercare needs so that they can reassess your need for medications and monitor your lab values.  Discharge Instructions    Diet general    Complete by:  As directed    Renal diet, low potassium diet   Increase activity slowly    Complete by:  As directed        Medication List    STOP taking these medications   atorvastatin 20 MG tablet Commonly known as:  LIPITOR   cloNIDine HCl 0.1 MG Tb12 ER tablet Commonly known as:  KAPVAY   pantoprazole 40 MG tablet Commonly known as:  PROTONIX     TAKE these medications   acetaminophen 325 MG tablet Commonly known as:  TYLENOL Take 2 tablets (650 mg total) by mouth every 6 (six) hours as needed for mild pain (or Fever >/= 101).   albuterol (2.5 MG/3ML) 0.083% nebulizer solution Commonly known as:  PROVENTIL Take 3 mLs (2.5 mg total)  by nebulization every 6 (six) hours as needed for wheezing or shortness of breath.   aspirin EC 81 MG tablet Take 81 mg by mouth daily at 12 noon.   cephALEXin 500 MG capsule Commonly known as:  KEFLEX Take 1 capsule (500 mg total) by mouth daily.   feeding supplement (NEPRO CARB STEADY) Liqd Take 237 mLs by mouth daily. Start taking on:  05/16/2016   fluconazole 100 MG tablet Commonly known as:  DIFLUCAN Take 1 tablet (100 mg total) by mouth daily. Start taking on:  05/16/2016   HYDROcodone-acetaminophen 5-325 MG tablet Commonly known as:  NORCO/VICODIN Take 1 tablet by mouth every 6 (six) hours as needed. What changed:  reasons to  take this   metoprolol tartrate 25 MG tablet Commonly known as:  LOPRESSOR Take 0.5 tablets (12.5 mg total) by mouth 2 (two) times daily.   nitroGLYCERIN 0.4 MG SL tablet Commonly known as:  NITROSTAT Place 0.4 mg under the tongue every 5 (five) minutes as needed for chest pain.   sodium bicarbonate 650 MG tablet Take 650 mg by mouth 4 (four) times daily.      No Known Allergies  Contact information for follow-up providers    Sandi Mariscal, MD Follow up in 1 month(s).   Specialty:  Interventional Radiology Why:  bilateral nephrostomy tube care and exchange Contact information: [address] [place] [address] [phone]        MACDIARMID,SCOTT A, MD Follow up in 1 month(s).   Specialty:  Urology Why:  suprapubic catheter, bilateral nephrostomy tube Contact information: [address] [place] [address] [phone]        patient to continue follow with palliative care at SNF, transition to hospice when appropriate. Minette Brine information for after-discharge care    [facility] .   Specialty:  [place] information: [address] [place] [phone]                   The results of significant diagnostics from this hospitalization (including imaging, microbiology, ancillary and laboratory) are listed below for reference.    Significant Diagnostic Studies: Ct Abdomen Pelvis Wo Contrast  Result Date: 05/08/2016 CLINICAL DATA:  Initial evaluation for leukocytosis with bilateral or urostomy tubes in place. Also with acute abdominal pain. EXAM: CT ABDOMEN AND PELVIS WITHOUT CONTRAST TECHNIQUE: Multidetector CT imaging of the abdomen and pelvis was performed following the standard protocol without IV contrast. COMPARISON:  Prior CT from 03/28/2016. FINDINGS: Lower chest: Mild scatter atelectatic changes present within the visualized  left lung base. Calcified granuloma present within the right middle lobe. Visualized lung bases are otherwise unremarkable. Hepatobiliary: Limited noncontrast evaluation of the liver is unremarkable. Hyperdensity within the gallbladder lumen likely reflects stones. No CT findings to suggest acute cholecystitis. No biliary dilatation. Pancreas: Pancreas within normal limits without acute abnormality. Spleen: Spleen within normal limits. Adrenals/Urinary Tract: Adrenal glands within normal limits. Bilateral percutaneous urostomy tubes in place. Distal aspects of the tube is coiled within the renal pelvises bilaterally. There is moderate left hydroureteronephrosis. No right-sided hydronephrosis or hydroureter. Bladder largely decompressed with a suprapubic catheter in place. Sub cm curvilinear hyperdensities within the posterior bladder lumen near the expected location of the left UVJ may reflect small stones. These were present on prior exam, although they are present on the right on that  previous study, suggesting at these are mobile. Stomach/Bowel: Stomach within normal limits. No evidence for bowel obstruction. Right lower quadrant ostomy present. No definite acute inflammatory changes about the bowels. Vascular/Lymphatic: Prominent atheromatous plaque throughout the intra-abdominal aorta. Aorta again noted to be dilated up to 2.6 cm in diameter. No definite adenopathy identified. Reproductive: Prostate appears to be surgically absent. Other: Diffuse anasarca. No free air. Small amount of free fluid within the presacral space. Musculoskeletal: No acute osseous abnormality. No worrisome lytic or blastic osseous lesions. Degenerative spondylolysis and facet arthrosis noted within the lumbar spine. IMPRESSION: 1. Bilateral percutaneous nephrostomy tubes in place. There is moderate left hydroureteronephrosis. Bedside check to ensure that the left tube is functioning is recommended. Right renal collecting system is  decompressed. Bladder decompressed with a suprapubic catheter in place. 2. No other definite acute intra-abdominal or pelvic process identified. 3. Cholelithiasis without evidence for acute cholecystitis. 4. Anasarca. 5. Ectatic intra-abdominal aorta with atherosclerosis, stable from recent CT. Please see prior report for follow-up recommendations regarding this finding. Electronically Signed   By: Jeannine Boga M.D.   On: 05/08/2016 02:49   Dg Chest 2 View  Result Date: 05/07/2016 CLINICAL DATA:  Acute onset of generalized weakness. Upper abdominal and chest pain. Leukocytosis. Initial encounter. EXAM: CHEST  2 VIEW COMPARISON:  None. FINDINGS: The lungs are well-aerated. There is no evidence of focal opacification, pleural effusion or pneumothorax. A vague curved density is noted overlying the right midlung zone, of uncertain significance. There are a vague linear lucencies surrounding the superior mediastinum on the frontal view. A large area of apparent air is noted at the anterior mediastinum, on the lateral view. Air appears to surround the heart posteriorly. Findings are concerning for a very large amount of pneumomediastinum. The heart is normal in size; the patient is status post median sternotomy, with an aortic valve replacement seen. No acute osseous abnormalities are seen. IMPRESSION: 1. Lungs clear bilaterally. Vague curved density overlying the right midlung zone is of uncertain significance. 2. Suspect very large amount of pneumomediastinum. CT of the chest is recommended for further evaluation. Critical Value/emergent results were called by telephone at the time of interpretation on 05/07/2016 at 10:53 pm to Dr. Regenia Skeeter, who verbally acknowledged these results. Electronically Signed   By: Garald Balding M.D.   On: 05/07/2016 22:55   Ct Chest Wo Contrast  Result Date: 05/08/2016 CLINICAL DATA:  Leukocytosis, question of pneumomediastinum seen on chest radiograph EXAM: CT CHEST  WITHOUT CONTRAST TECHNIQUE: Multidetector CT imaging of the chest was performed following the standard protocol without IV contrast. COMPARISON:  Same day chest radiograph, bases from CT abdomen dated 03/28/2016. FINDINGS: Cardiovascular: The patient is status post median sternotomy and aortic valvular replacement. Mild fusiform ectasia of the ascending aorta measuring up to 3.9 cm. The descending aortic aneurysm. Atheromatous calcifications of the aorta. Dense coronary arteriosclerosis is identified. The heart is normal in size. There is no pericardial effusion. Mediastinum/Nodes: No pneumomediastinum. Skin fold artifact likely the cause of curvilinear density seen on same day chest radiograph. Calcified appearing subcarinal lymph nodes consistent with old granulomatous disease. Mild thyromegaly. Lungs/Pleura: 10 x 7 mm circumscribed noncalcified nodule in the left lower lobe medially series 5, image 103. Noncalcified right middle lobe lateral segment 2 mm nodular density, series 5, image 82, calcified right lower lobe 3 mm nodule series 5, image 85 and a calcified 5 x 6 mm nodule the right middle lobe abutting the major fissure. No pneumothorax, effusion or pulmonary consolidation. Upper  Abdomen: Splenic calcifications consistent with old granulomatous disease. There is mild ectasia and/or dilatation of the left renal collecting system. No definite source of obstruction visualized. PE few rounded calcification also noted associated with the pancreas. Musculoskeletal: No acute osseous abnormality. IMPRESSION: No pneumomediastinum. Skin fold artifact likely the cause of curvilinear density seen on chest radiograph over the right hemithorax. Calcified and noncalcified pulmonary nodules. This in conjunction with calcified subcarinal and splenic calcifications likely reflect old granulomatous disease. There is however a dominant noncalcified nodule in the left lower lobe measuring 10 x 7 mm, mean 8.5 mm. Non-contrast  chest CT at 3-6 months is recommended. If the nodules are stable at time of repeat CT, then future CT at 18-24 months (from 9/17 ct) is considered optional for low-risk patients, all but is recommended for high-risk patients. This recommendation follows the consensus statement: Guidelines for Management of Incidental Pulmonary Nodules Detected on CT Images:From the Fleischner Society 2017; published online before print (10.1148/radiol.SG:5268862). Electronically Signed   By: Ashley Royalty M.D.   On: 05/08/2016 00:31   Dg Cystogram  Result Date: 05/10/2016 CLINICAL DATA:  Foul smelling head drainage from the urinary bladder, clinical concern for colovesical fistula EXAM: CYSTOGRAM TECHNIQUE: After catheterization of the urinary bladder following sterile technique the bladder was filled with 120 mL Cysto-Hypaque 30% by drip infusion. Serial spot images were obtained during bladder filling and post draining. FLUOROSCOPY TIME:  Fluoroscopy Time:  2 minutes, 36 seconds Radiation Exposure Index (if provided by the fluoroscopic device): 14.3 Number of Acquired Spot Images: 0 COMPARISON:  05/08/2016 FINDINGS: Initial fluoroscopy over the abdomen demonstrates bilateral nephrostomy tubes along with lumbar spondylosis. I injected the urinary bladder through the super pubic catheter. The urinary bladder is irregular inferiorly were there is a large filling defect. There is prompt posterior drainage into the rectum. The whole posterior portion of the urinary bladder is irregular. With turning of the patient under fluoroscopic observation, I believe that the fistulous connection to the rectum is directly posterior to the urinary bladder in the midline. We are able to retrograde fill the distal sigmoid colon through injection of the suprapubic catheter. IMPRESSION: 1. 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 This caused retrograde filling of the sigmoid colon as I injected the  urinary bladder. 2. Filling defect along the bottom of the urinary bladder is nonspecific. This might be from an enlarged prostate gland but blood clot or tumor is not excluded. 3. The patient had difficulty turning due to infirmity, which limited our exam and our ability to obtain images at different angles. Electronically Signed   By: Van Clines M.D.   On: 05/10/2016 13:16    Microbiology: Recent Results (from the past 240 hour(s))  Blood culture (routine x 2)     Status: None   Collection Time: 05/07/16  5:01 PM  Result Value Ref Range Status   Specimen Description BLOOD RIGHT ARM  Final   Special Requests BOTTLES DRAWN AEROBIC AND ANAEROBIC 5CC  Final   Culture   Final    NO GROWTH 5 DAYS Performed at [facility]    Report Status 05/12/2016 FINAL  Final  MRSA PCR Screening     Status: None   Collection Time: 05/08/16  2:40 AM  Result Value Ref Range Status   MRSA by PCR NEGATIVE NEGATIVE Final    Comment:        The GeneXpert MRSA Assay (FDA approved for NASAL specimens only), is one  component of a comprehensive MRSA colonization surveillance program. It is not intended to diagnose MRSA infection nor to guide or monitor treatment for MRSA infections.   Culture, blood (Routine X 2) w Reflex to ID Panel     Status: None   Collection Time: 05/08/16  4:05 AM  Result Value Ref Range Status   Specimen Description BLOOD LEFT ARM  Final   Special Requests BOTTLES DRAWN AEROBIC AND ANAEROBIC 5 CC  Final   Culture   Final    NO GROWTH 5 DAYS Performed at [facility]    Report Status 05/13/2016 FINAL  Final  C difficile quick scan w PCR reflex     Status: None   Collection Time: 05/09/16 10:02 AM  Result Value Ref Range Status   C Diff antigen NEGATIVE NEGATIVE Final   C Diff toxin NEGATIVE NEGATIVE Final   C Diff interpretation No C. difficile detected.  Final  Gastrointestinal Panel by PCR , Stool     Status: None   Collection Time: 05/09/16 10:02 AM    Result Value Ref Range Status   Campylobacter species NOT DETECTED NOT DETECTED Final   Plesimonas shigelloides NOT DETECTED NOT DETECTED Final   Salmonella species NOT DETECTED NOT DETECTED Final   Yersinia enterocolitica NOT DETECTED NOT DETECTED Final   Vibrio species NOT DETECTED NOT DETECTED Final   Vibrio cholerae NOT DETECTED NOT DETECTED Final   Enteroaggregative E coli (EAEC) NOT DETECTED NOT DETECTED Final   Enteropathogenic E coli (EPEC) NOT DETECTED NOT DETECTED Final   Enterotoxigenic E coli (ETEC) NOT DETECTED NOT DETECTED Final   Shiga like toxin producing E coli (STEC) NOT DETECTED NOT DETECTED Final   Shigella/Enteroinvasive E coli (EIEC) NOT DETECTED NOT DETECTED Final   Cryptosporidium NOT DETECTED NOT DETECTED Final   Cyclospora cayetanensis NOT DETECTED NOT DETECTED Final   Entamoeba histolytica NOT DETECTED NOT DETECTED Final   Giardia lamblia NOT DETECTED NOT DETECTED Final   Adenovirus F40/41 NOT DETECTED NOT DETECTED Final   Astrovirus NOT DETECTED NOT DETECTED Final   Norovirus GI/GII NOT DETECTED NOT DETECTED Final   Rotavirus A NOT DETECTED NOT DETECTED Final   Sapovirus (I, II, IV, and V) NOT DETECTED NOT DETECTED Final  Culture, Urine     Status: Abnormal   Collection Time: 05/09/16 10:30 AM  Result Value Ref Range Status   Specimen Description URINE, SUPRAPUBIC  Final   Special Requests NONE  Final   Culture (A)  Final    >=100,000 COLONIES/mL ESCHERICHIA COLI Confirmed Extended Spectrum Beta-Lactamase Producer (ESBL) 80,000 COLONIES/mL ENTEROCOCCUS FAECIUM >=100,000 COLONIES/mL PSEUDOMONAS AERUGINOSA    Report Status 05/13/2016 FINAL  Final   Organism ID, Bacteria ESCHERICHIA COLI (A)  Final   Organism ID, Bacteria ENTEROCOCCUS FAECIUM (A)  Final   Organism ID, Bacteria PSEUDOMONAS AERUGINOSA (A)  Final      Susceptibility   Escherichia coli - MIC*    AMPICILLIN >=32 RESISTANT Resistant     CEFAZOLIN >=64 RESISTANT Resistant     CEFTRIAXONE  >=64 RESISTANT Resistant     CIPROFLOXACIN >=4 RESISTANT Resistant     GENTAMICIN >=16 RESISTANT Resistant     IMIPENEM <=0.25 SENSITIVE Sensitive     NITROFURANTOIN <=16 SENSITIVE Sensitive     TRIMETH/SULFA >=320 RESISTANT Resistant     AMPICILLIN/SULBACTAM >=32 RESISTANT Resistant     PIP/TAZO >=128 RESISTANT Resistant     Extended ESBL POSITIVE Resistant     * >=100,000 COLONIES/mL ESCHERICHIA COLI   Enterococcus faecium -  MIC*    AMPICILLIN >=32 RESISTANT Resistant     LEVOFLOXACIN >=8 RESISTANT Resistant     NITROFURANTOIN 64 INTERMEDIATE Intermediate     VANCOMYCIN 1 SENSITIVE Sensitive     LINEZOLID 2 SENSITIVE Sensitive     * 80,000 COLONIES/mL ENTEROCOCCUS FAECIUM   Pseudomonas aeruginosa - MIC*    CEFTAZIDIME 4 SENSITIVE Sensitive     CIPROFLOXACIN >=4 RESISTANT Resistant     GENTAMICIN <=1 SENSITIVE Sensitive     IMIPENEM 2 SENSITIVE Sensitive     PIP/TAZO 8 SENSITIVE Sensitive     CEFEPIME 2 SENSITIVE Sensitive     * >=100,000 COLONIES/mL PSEUDOMONAS AERUGINOSA  Culture, Urine     Status: Abnormal   Collection Time: 05/09/16 10:31 AM  Result Value Ref Range Status   Specimen Description KIDNEY RIGHT NEPHROSTOMY  Final   Special Requests NONE  Final   Culture (A)  Final    50,000 COLONIES/mL ESCHERICHIA COLI Confirmed Extended Spectrum Beta-Lactamase Producer (ESBL) SENSITIVTIES  REPEATED FOR CONFIRMATION >=100,000 COLONIES/mL YEAST Performed at [facility]    Report Status 05/13/2016 FINAL  Final   Organism ID, Bacteria ESCHERICHIA COLI (A)  Final      Susceptibility   Escherichia coli - MIC*    AMPICILLIN >=32 RESISTANT Resistant     CEFAZOLIN >=64 RESISTANT Resistant     CEFEPIME 4 RESISTANT Resistant     CEFTAZIDIME >=64 RESISTANT Resistant     CEFTRIAXONE >=64 RESISTANT Resistant     CIPROFLOXACIN >=4 RESISTANT Resistant     GENTAMICIN >=16 RESISTANT Resistant     IMIPENEM <=0.25 SENSITIVE Sensitive     TRIMETH/SULFA >=320 RESISTANT Resistant      AMPICILLIN/SULBACTAM >=32 RESISTANT Resistant     PIP/TAZO >=128 RESISTANT Resistant     Extended ESBL POSITIVE Resistant     * 50,000 COLONIES/mL ESCHERICHIA COLI  Culture, Urine     Status: Abnormal   Collection Time: 05/09/16 10:32 AM  Result Value Ref Range Status   Specimen Description KIDNEY LEFT NEPHROSTOMY  Final   Special Requests NONE  Final   Culture (A)  Final    40,000 COLONIES/mL ESCHERICHIA COLI Confirmed Extended Spectrum Beta-Lactamase Producer (ESBL) SENSITIVITIES REPEATED FOR CONFIRMATION >=100,000 COLONIES/mL YEAST Performed at [facility]    Report Status 05/13/2016 FINAL  Final   Organism ID, Bacteria ESCHERICHIA COLI (A)  Final      Susceptibility   Escherichia coli - MIC*    AMPICILLIN >=32 RESISTANT Resistant     CEFAZOLIN >=64 RESISTANT Resistant     CEFEPIME 32 RESISTANT Resistant     CEFTAZIDIME >=64 RESISTANT Resistant     CEFTRIAXONE >=64 RESISTANT Resistant     CIPROFLOXACIN >=4 RESISTANT Resistant     GENTAMICIN >=16 RESISTANT Resistant     IMIPENEM <=0.25 SENSITIVE Sensitive     TRIMETH/SULFA >=320 RESISTANT Resistant     AMPICILLIN/SULBACTAM >=32 RESISTANT Resistant     PIP/TAZO >=128 RESISTANT Resistant     Extended ESBL POSITIVE Resistant     * 40,000 COLONIES/mL ESCHERICHIA COLI     Labs: Basic Metabolic Panel:  Recent Labs Lab 05/10/16 0347 05/11/16 0329 05/12/16 0349 05/13/16 0535 05/14/16 0427 05/15/16 0459  NA 137 140 142 140 140 141  K 5.1 5.0 5.6* 5.3* 5.7* 5.7*  CL 113* 115* 115* 113* 113* 114*  CO2 19* 21* 22 21* 22 22  GLUCOSE 103* 105* 95 90 93 92  BUN 57* 51* 43* 37* 37* 39*  CREATININE 2.59* 1.99* 1.91* 1.63* 2.13*  2.06*  CALCIUM 8.6* 8.6* 8.7* 8.8* 8.6* 8.6*  MG 2.1  --   --   --   --  1.4*   Liver Function Tests:  Recent Labs Lab 05/10/16 0347  AST 29  ALT 35  ALKPHOS 95  BILITOT 0.4  PROT 5.8*  ALBUMIN 2.2*   No results for input(s): LIPASE, AMYLASE in the last 168 hours. No results  for input(s): AMMONIA in the last 168 hours. CBC:  Recent Labs Lab 05/10/16 0347 05/11/16 0329 05/12/16 0349 05/14/16 0427 05/15/16 0459  WBC 12.3* 13.7* 10.7* 10.6* 10.0  NEUTROABS  --   --  8.8*  --   --   HGB 8.4* 8.6* 8.2* 8.3* 7.5*  HCT 25.8* 26.4* 25.8* 26.4* 24.2*  MCV 88.1 88.9 90.5 89.2 93.1  PLT 308 333 305 281 220   Cardiac Enzymes: No results for input(s): CKTOTAL, CKMB, CKMBINDEX, TROPONINI in the last 168 hours. BNP: BNP (last 3 results) No results for input(s): BNP in the last 8760 hours.  ProBNP (last 3 results) No results for input(s): PROBNP in the last 8760 hours.  CBG: No results for input(s): GLUCAP in the last 168 hours.     SignedFlorencia Reasons MD, PhD  [facility] 05/15/2016, 4:18 PM

## 2016-05-15 NOTE — Progress Notes (Signed)
Post R &L Nephrostomy tube exchange, draining well, dressing dry and intact. Discharge to [facility], report given to [facility]. Transport called.

## 2016-05-15 NOTE — Progress Notes (Signed)
Nutrition Follow-up  DOCUMENTATION CODES:   Severe malnutrition in context of acute illness/injury, Underweight  INTERVENTION:  Continue Nepro Shake po, but decrease to 1x/day. Each supplement provides 425 kcal and 19 grams protein.  Encouraged patient to continue adequate intake of protein and calories with meals and beverages.  NUTRITION DIAGNOSIS:   Inadequate oral intake related to acute illness, poor appetite as evidenced by per patient/family report.  Improved.  GOAL:   Patient will meet greater than or equal to 90% of their needs  Met.  MONITOR:   PO intake, Supplement acceptance, Weight trends, Labs, I & O's  REASON FOR ASSESSMENT:   Malnutrition Screening Tool, Consult Assessment of nutrition requirement/status  ASSESSMENT:   82 y.o. 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 Presented with somnolence and abnormal blood work. He's been taking care of by his daughter at home who is interested in placement patient has had decreased by mouth intake and patient has been more sedated and sleepy. Been having foul-smelling urine patient had his labs checked today by PCP. He was having some vague abdominal discomfort PTA.  -Plan will be for placement at [facility] once medically stable for discharge.  Patient reports he is doing better with meals since the meats are being chopped. He is able to finish all of his meals. Reports he had one Nepro yesterday, but is on track to have both of them today. Denies N/V or abdominal pain at this time.  Meal Completion: 100% per patient In the past 24 hours, patient has had 2317 kcal (100% estimated kcal needs) and 94 grams protein (100% estimated protein needs).   Medications reviewed and  include: famotidine, Remeron 7.5 mg daily at bedtime.  Labs reviewed: Potassium 5.7, Chloride 114, BUN 39, Creatinine 2.06, Magnesium 1.4.  Attempted to weigh patient with bed scale. Likely was not zeroed out as weight recorded was 55.2 kg, much higher than other weights this admission.  Diet Order:  Diet renal with fluid restriction Fluid restriction: 1200 mL Fluid; Room service appropriate? Yes; Fluid consistency: Thin  Skin:  Reviewed, no issues  Last BM:  10/19  Height:   Ht Readings from Last 1 Encounters:  05/08/16 5' 8.5" (1.74 m)    Weight:   Wt Readings from Last 1 Encounters:  05/08/16 108 lb 8 oz (49.2 kg)    Ideal Body Weight:  71.36 kg  BMI:  Body mass index is 16.26 kg/m.  Estimated Nutritional Needs:   Kcal:  1475-1625 (30-33 kcal/kg)  Protein:  55-65 grams  Fluid:  1.225 L/day (25 mL/kg/day)  EDUCATION NEEDS:   No education needs identified at this time  Willey Blade, MS, RD, LDN Pager: [phone] After Hours Pager: [phone]

## 2016-05-16 ENCOUNTER — Non-Acute Institutional Stay (SKILLED_NURSING_FACILITY): Payer: Medicare Other | Admitting: Adult Health

## 2016-05-16 ENCOUNTER — Ambulatory Visit: Payer: Self-pay | Admitting: Internal Medicine

## 2016-05-16 ENCOUNTER — Encounter: Payer: Self-pay | Admitting: Adult Health

## 2016-05-16 DIAGNOSIS — I471 Supraventricular tachycardia, unspecified: Secondary | ICD-10-CM | POA: Insufficient documentation

## 2016-05-16 DIAGNOSIS — N1330 Unspecified hydronephrosis: Secondary | ICD-10-CM | POA: Diagnosis not present

## 2016-05-16 DIAGNOSIS — I38 Endocarditis, valve unspecified: Secondary | ICD-10-CM | POA: Diagnosis not present

## 2016-05-16 DIAGNOSIS — Z936 Other artificial openings of urinary tract status: Secondary | ICD-10-CM | POA: Diagnosis not present

## 2016-05-16 DIAGNOSIS — Z951 Presence of aortocoronary bypass graft: Secondary | ICD-10-CM | POA: Diagnosis not present

## 2016-05-16 DIAGNOSIS — N184 Chronic kidney disease, stage 4 (severe): Secondary | ICD-10-CM

## 2016-05-16 DIAGNOSIS — Z9359 Other cystostomy status: Secondary | ICD-10-CM

## 2016-05-16 DIAGNOSIS — N179 Acute kidney failure, unspecified: Secondary | ICD-10-CM

## 2016-05-16 DIAGNOSIS — E43 Unspecified severe protein-calorie malnutrition: Secondary | ICD-10-CM | POA: Diagnosis not present

## 2016-05-16 DIAGNOSIS — C61 Malignant neoplasm of prostate: Secondary | ICD-10-CM

## 2016-05-16 DIAGNOSIS — D508 Other iron deficiency anemias: Secondary | ICD-10-CM

## 2016-05-16 DIAGNOSIS — Z933 Colostomy status: Secondary | ICD-10-CM | POA: Diagnosis not present

## 2016-05-16 DIAGNOSIS — I2510 Atherosclerotic heart disease of native coronary artery without angina pectoris: Secondary | ICD-10-CM

## 2016-05-16 NOTE — Progress Notes (Signed)
Patient ID: Joseph Carrillo, male   DOB: 1926/10/17, 82 y.o.   MRN: [id number]   Location:   [facility] Room Number: 124-A Place of Service:  SNF (31)   CODE STATUS: DNR  No Known Allergies  Chief Complaint  Patient presents with  . Hospitalization Follow-up    Hospital Follow up    HPI:  He is an unfortunate man with a history of prostate cancer status post bilateral nephrostomy stents; suprapubic foley; and colostomy. He was hospitalized for sepsis; acute renal failure and entero-vesicular fistula . He is here for long term placement the goal of his care is palliative at this time.   Past Medical History:  Diagnosis Date  . Blood in stool   . Blood transfusion without reported diagnosis   . CAD (coronary artery disease)    a. 2000 - Says he had CABG x 3 or 4.  . Chicken pox   . Colon polyp   . Elevated blood pressure reading   . Glaucoma   . Heart murmur   . Hx: UTI (urinary tract infection)   . Hyperlipidemia   . Prostate cancer ([place])   . Valvular heart disease    a. 2005 s/p Valve replacement - presumably bioprosthetic (not on anticoagulation).  He doesn't know which valve.    Past Surgical History:  Procedure Laterality Date  . CORONARY ARTERY BYPASS GRAFT    . IR GENERIC HISTORICAL  04/02/2016   IR NEPHROSTOMY PLACEMENT LEFT 04/02/2016 Sandi Mariscal, MD [facility]  . IR GENERIC HISTORICAL  04/02/2016   IR NEPHROSTOMY PLACEMENT RIGHT 04/02/2016 Sandi Mariscal, MD [facility]  . LUNG SURGERY    . PROSTATE SURGERY      Social History   Social History  . Marital status: Married    Spouse name: N/A  . Number of children: N/A  . Years of education: N/A   Occupational History  . Not on file.   Social History Main Topics  . Smoking status: Former Smoker    Quit date: 07/24/1971  . Smokeless tobacco: Never Used  . Alcohol use No     Comment: prev drank.  quit 1973 or 1974.  . Drug use: No  . Sexual activity: Not on file   Other Topics Concern  . Not  on file   Social History Narrative   Lives in [place], [place] area by himself but is currently staying with his sister in [place] area after prolonged hospitalization this summer.   Family History  Problem Relation Age of Onset  . Heart disease Father     Pt says his brother had heart infection and died suddenly.      VITAL SIGNS BP 137/80   Pulse 72   Temp 98 F (36.7 C) (Oral)   Resp 20   Ht _0  (1.803 m)   Wt 110 lb (49.9 kg)   SpO2 98%   BMI 15.34 kg/m   Patient's Medications  New Prescriptions   No medications on file  Previous Medications   ACETAMINOPHEN (TYLENOL) 325 MG TABLET    Take 2 tablets (650 mg total) by mouth every 6 (six) hours as needed for mild pain (or Fever >/= 101).   ALBUTEROL (PROVENTIL) (2.5 MG/3ML) 0.083% NEBULIZER SOLUTION    Take 3 mLs (2.5 mg total) by nebulization every 6 (six) hours as needed for wheezing or shortness of breath.   ASPIRIN EC 81 MG TABLET    Take 81 mg by mouth daily at 12 noon.  CEPHALEXIN (KEFLEX) 500 MG CAPSULE    Take 1 capsule (500 mg total) by mouth daily.   FLUCONAZOLE (DIFLUCAN) 100 MG TABLET    Take 1 tablet (100 mg total) by mouth daily.   HYDROCODONE-ACETAMINOPHEN (NORCO/VICODIN) 5-325 MG TABLET    Take 1 tablet by mouth every 6 (six) hours as needed.   METOPROLOL TARTRATE (LOPRESSOR) 25 MG TABLET    Take 0.5 tablets (12.5 mg total) by mouth 2 (two) times daily.   NITROGLYCERIN (NITROSTAT) 0.4 MG SL TABLET    Place 0.4 mg under the tongue every 5 (five) minutes as needed for chest pain.   NUTRITIONAL SUPPLEMENTS (FEEDING SUPPLEMENT, NEPRO CARB STEADY,) LIQD    Take 237 mLs by mouth daily.   SODIUM BICARBONATE 650 MG TABLET    Take 650 mg by mouth 4 (four) times daily.  Modified Medications   No medications on file  Discontinued Medications   No medications on file     SIGNIFICANT DIAGNOSTIC EXAMS  05-07-16: chest x-ray: 1. Lungs clear bilaterally. Vague curved density overlying the right midlung zone is of  uncertain significance. 2. Suspect very large amount of pneumomediastinum. CT of the chest is recommended for further evaluation.  05-07-16: ct of chest: No pneumomediastinum. Skin fold artifact likely the cause of curvilinear density seen on chest radiograph over the right hemithorax.  Calcified and noncalcified pulmonary nodules. This in conjunction with calcified subcarinal and splenic calcifications likely reflect old granulomatous disease. There is however a dominant noncalcified nodule in the left lower lobe measuring 10 x 7 mm, mean 8.5 mm. Non-contrast chest CT at 3-6 months is recommended. If the nodules are stable at time of repeat CT, then future CT at 18-24 months (from 9/17 ct) is considered optional for low-risk patients, all but is recommended for high-risk patients.   05-08-16: 2-d echo: Left ventricle: The cavity size was normal. Systolic function was normal. The estimated ejection fraction was in the range of 55%  to 60%. Wall motion was normal; there were no regional wall motion abnormalities. Features are consistent with a pseudonormal left ventricular filling pattern, with concomitant abnormal relaxation and increased filling pressure (grade 2 diastolic dysfunction). - Aortic valve: Severely calcified annulus. Trileaflet; normal thickness, mildly calcified leaflets. There was mild stenosis. There was trivial regurgitation.  - Mitral valve: Calcified annulus. Mildly thickened leaflets . Moderate thickening of the anterior leaflet. There was mild regurgitation. - Left atrium: The atrium was mildly dilated. - Pulmonary arteries: PA peak pressure: 35 mm Hg (S).  05-08-16: ct of abdomen and pelvis: 1. Bilateral percutaneous nephrostomy tubes in place. There is moderate left hydroureteronephrosis. Bedside check to ensure that the left tube is functioning is recommended. Right renal collecting system is decompressed. Bladder decompressed with a suprapubic catheter in place. 2. No other  definite acute intra-abdominal or pelvic process identified. 3. Cholelithiasis without evidence for acute cholecystitis. 4. Anasarca. 5. Ectatic intra-abdominal aorta with atherosclerosis, stable from recent CT. Please see prior report for follow-up recommendations regarding this finding.  05-10-16: cystogram: 1. 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 This caused retrograde filling of the sigmoid colon as I injected the urinary bladder. 2. Filling defect along the bottom of the urinary bladder is nonspecific. This might be from an enlarged prostate gland but blood clot or tumor is not excluded. 3. The patient had difficulty turning due to infirmity, which limited our exam and our ability to obtain images at different angles.    LABS REVIEWED:  05-07-16: wbc 22.4; hgb 10.2; hct 31.1; mcv 89.3; plt 251; glucose 145; bun 63; creat 3.19; k+ 5.3; na++ 133; alk phos 122; ast 76; alt 71; albumin 3.0; blood culture: no growth 05-08-16: wbc 21.1; hgb 8.1; hct 25.5; mcv 88.9; plt 290; glucose 161; bun 64; creat 3.06 ;k+ 5.1; na+ +133; ast 54; alt 57; albumin 2.5; tsh 0.694; pre-albumin 8.4;  05-09-16: urine cultures: e-coli  05-10-16: wbc 12.3; hgb 8.4; hct 25.8; mcv 88.1; plt 308; glucose 103; bun 57; creat 2.59; k+ 5.1; na++ 137; liver normal albumin 2.2; iron 11; tibc 108; vit B 12: 478; folate 9.0 05-15-16; wbc 10.0; hgb 7.5; hct 24.2 ;mcv 93.1; plt 220; glucose 92; bun 39; creat 2.06; k+ 5.7; na++ 141; mag 1.4; cortisol 12.1    Review of Systems  Unable to perform ROS: Other  Sleepy    Physical Exam  Constitutional: No distress.  Frail   Eyes: Conjunctivae are normal.  Neck: Neck supple. No JVD present. No thyromegaly present.  Cardiovascular: Normal rate, regular rhythm and intact distal pulses.   Respiratory: Effort normal and breath sounds normal. No respiratory distress. He has no wheezes.  GI: Soft. Bowel sounds are normal. He exhibits  no distension. There is no tenderness.  Colostomy   Genitourinary:  Genitourinary Comments: Suprapubic catheter  Nephrostomy stent X 2   Musculoskeletal: He exhibits no edema.  Able to move all extremities   Lymphadenopathy:    He has no cervical adenopathy.  Neurological: He is alert.  Skin: Skin is warm and dry. He is not diaphoretic.  Psychiatric: He has a normal mood and affect.     ASSESSMENT/ PLAN:  1. Prostate cancer: is status post radiation therapy PSA 0. 00  Will continue vicodin 5.325 mg every 6 hours as needed for pain   2. CAD is status post CABG X3 (2000); no complaint of chest pain will continue asa 81 mg daily and has prn ntg  3. Valvular heart disease; presumed bioprosthetic valve replacement 2005: will continue asa 81 mg daily   4. enterovesicular fistula: due to radiation therapy for his prostate cancer: is status post colostomy and suprapubic foley. He will continue to have purulent and foul drainage from catheter. He has a large fistulous communication between posterior bladder and rectum with retrograde filling of sigmoid colon. He will be on long term keflex 500 mg daily with diflucan 100 mg for suppression of uti; this was recommended by Dr. Linus Salmons (I/D)  5. obstructive uropathy: is status post nephrostomy bilateral with tube exchange on 05-15-16.   6. Acute on chronic renal failure: bun/creat 39/2.06  7. SVT: initially treated with adenosine; he was transitioned to beta blocker which was held due to bradycardia. EF 55-60% (05-08-16)   8. Hyperkalemia: chronic renal failure: k+ 5.7; will continue sodium bicarb 650 mg four times daily   9. Anemia: hgb 7.5 will monitor   10. Malnutrition: pre-albumin 8.4; albumin 2.2; will continue supplement per facility protocol will monitor his weight is 110 pounds.    Will check cbc; cmp Will setup a palliative care consult Will have facility contact urology for s/p irrigation   Time spent with patient  50  minutes  >50% time spent counseling; reviewing medical record; tests; labs; and developing future plan of care   MD is aware of resident's narcotic use and is in agreement with current plan of care. We will attempt to wean resident as [place] NP [facility]  Contact [phone]  Monday through Friday 8am- 5pm  After hours call [phone]   

## 2016-05-17 ENCOUNTER — Other Ambulatory Visit: Payer: Self-pay

## 2016-05-17 DIAGNOSIS — R1013 Epigastric pain: Principal | ICD-10-CM

## 2016-05-17 DIAGNOSIS — G8929 Other chronic pain: Secondary | ICD-10-CM

## 2016-05-17 MED ORDER — HYDROCODONE-ACETAMINOPHEN 5-325 MG PO TABS: 1.0000 | ORAL_TABLET | Freq: Four times a day (QID) | ORAL | 0 refills | Status: AC | PRN

## 2016-05-17 NOTE — Telephone Encounter (Signed)
Prescription request was received from:  [facility]  [address] [place] [address]  PHONE: [phone]   Fax: [phone] 

## 2016-05-21 ENCOUNTER — Encounter: Payer: Self-pay | Admitting: Internal Medicine

## 2016-05-21 ENCOUNTER — Non-Acute Institutional Stay (SKILLED_NURSING_FACILITY): Payer: Medicare Other | Admitting: Internal Medicine

## 2016-05-21 DIAGNOSIS — Z933 Colostomy status: Secondary | ICD-10-CM | POA: Diagnosis not present

## 2016-05-21 DIAGNOSIS — N390 Urinary tract infection, site not specified: Secondary | ICD-10-CM

## 2016-05-21 DIAGNOSIS — Z951 Presence of aortocoronary bypass graft: Secondary | ICD-10-CM | POA: Diagnosis not present

## 2016-05-21 DIAGNOSIS — Z8546 Personal history of malignant neoplasm of prostate: Secondary | ICD-10-CM

## 2016-05-21 DIAGNOSIS — N321 Vesicointestinal fistula: Secondary | ICD-10-CM

## 2016-05-21 DIAGNOSIS — E43 Unspecified severe protein-calorie malnutrition: Secondary | ICD-10-CM

## 2016-05-21 DIAGNOSIS — N183 Chronic kidney disease, stage 3 unspecified: Secondary | ICD-10-CM

## 2016-05-21 DIAGNOSIS — I38 Endocarditis, valve unspecified: Secondary | ICD-10-CM

## 2016-05-21 DIAGNOSIS — Z936 Other artificial openings of urinary tract status: Secondary | ICD-10-CM | POA: Diagnosis not present

## 2016-05-21 DIAGNOSIS — D631 Anemia in chronic kidney disease: Secondary | ICD-10-CM

## 2016-05-21 DIAGNOSIS — B952 Enterococcus as the cause of diseases classified elsewhere: Secondary | ICD-10-CM

## 2016-05-21 DIAGNOSIS — I471 Supraventricular tachycardia: Secondary | ICD-10-CM

## 2016-05-21 DIAGNOSIS — Z9359 Other cystostomy status: Secondary | ICD-10-CM

## 2016-05-21 NOTE — Progress Notes (Signed)
Patient ID: Joseph Carrillo, male   DOB: [birth year], 82 y.o.   MRN: [id number]    HISTORY AND PHYSICAL   DATE: 05/21/2016  Location:    [facility] Room Number: 118 A Place of Service: SNF (31)   Extended Emergency Contact Information Primary Emergency Contact: [name] [place] Mobile Phone: [phone] Relation: Spouse Secondary Emergency Contact: Theola Sequin Address: [address]          [place], [place] [address] [name] of [place] Phone: [phone] Mobile Phone: [phone] Relation: Daughter  Advanced Directive information Does patient have an advance directive?: Yes, Type of Advance Directive: Out of facility DNR (pink MOST or yellow form), Does patient want to make changes to advanced directive?: No - Patient declined  Chief Complaint  Patient presents with  . 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 He presented with leukocytosis, hyperkalemia and AKI. B/l nephrostomy tube changed 10/24th. He has b/l hydronephrosis. Urine cx grew (+) ESBL pseudomonas, enterococcus and yeast. Per hospital, UTI can not be eradicated. Prealbumin 8.4; albumin 2.7-->2.2; B12 level 478; Hgb 7.5; WBC 22.4K-->10K with abs Neut 21.7K -->8.8K; Cr 3.13-->2.06; K+ 5.3-->5.7 at d/c. He presents to SNF for short term rehab  Today he reports no concerns. He is a poor historian due to memory loss. Hx obtained from chart. No nursing issues. No falls.  Prostate cancer - s/p radiation therapy. PSA 0. 00. He has pain related to enterovesicular fistula extensive surgical repair and is stable on vicodin 5.325 mg every 6 hours as needed. Fistula formed as a  consequence of XRT  CAD s/p CABG X 3 vessels (2000) - no CP at this time. Takes ASA 81 mg daily and has NTG prn  Valvular heart disease s/p presumed bioprosthetic valve replacement in 2005. 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 He is s/p colostomy and suprapubic foley. He will continue to have purulent and foul drainage from catheter. He has a large fistulous communication between posterior bladder and rectum with retrograde filling of sigmoid colon. He will be on long term keflex 500 mg daily with diflucan 100 mg for suppression of UTI per ID Dr Linus Salmons  CKD - stage 3. stable. Cr 2.06 at d/c  hx SVT -  initially treated with adenosine --> beta blocker which was held due to bradycardia. EF 55-60% (05-08-16)   Hx Anemia - Hgb 7.5. B12 level 478  Protein calorie Malnutrition -  pre-albumin 8.4; albumin 2.2; gets nutritional supplement per facility protocol. Current weight is 110 pounds.     Past Medical History:  Diagnosis Date  . Blood in stool   . Blood transfusion without reported diagnosis   . CAD (coronary artery disease)    a. 2000 - Says he had CABG x 3 or 4.  . Chicken pox   . Colon polyp   . Elevated blood pressure reading   . Glaucoma   . Heart murmur   . Hx: UTI (urinary tract infection)   . Hyperlipidemia   . Prostate cancer ([place])   . Valvular heart disease    a. 2005 s/p Valve replacement - presumably bioprosthetic (not on anticoagulation).  He doesn't know which valve.    Past Surgical History:  Procedure Laterality Date  . CORONARY ARTERY BYPASS GRAFT    . IR GENERIC HISTORICAL  04/02/2016   IR NEPHROSTOMY PLACEMENT LEFT 04/02/2016 Sandi Mariscal, MD [facility]  . IR GENERIC HISTORICAL  04/02/2016   IR NEPHROSTOMY PLACEMENT RIGHT 04/02/2016 Sandi Mariscal, MD [facility]  . IR GENERIC HISTORICAL  05/15/2016   IR NEPHROSTOMY EXCHANGE LEFT 05/15/2016 Aletta Edouard, MD [facility]  . IR GENERIC HISTORICAL   05/15/2016   IR NEPHROSTOMY EXCHANGE RIGHT 05/15/2016 Aletta Edouard, MD [facility]  . LUNG SURGERY    . PROSTATE SURGERY      Patient Care Team: Darreld Mclean, MD as PCP - General (Family Medicine)  Social History   Social History  . Marital status: Married    Spouse name: N/A  . Number of children: N/A  . Years of education: N/A   Occupational History  . Not on file.   Social History Main Topics  . Smoking status: Former Smoker    Quit date: 07/24/1971  . Smokeless tobacco: Never Used  . Alcohol use No     Comment: prev drank.  quit 1973 or 1974.  . Drug use: No  . Sexual activity: Not on file   Other Topics Concern  . Not on file   Social History Narrative   Lives in [place], [place] area by himself but is currently staying with his sister in [place] area after prolonged hospitalization this summer.     reports that he quit smoking about 44 years ago. He has never used smokeless tobacco. He reports that he does not drink alcohol or use drugs.  Family History  Problem Relation Age of Onset  . Heart disease Father     Pt says his brother had heart infection and died suddenly.   Family Status  Relation Status  . Father     Immunization History  Administered Date(s) Administered  . Influenza, High Dose Seasonal PF 05/07/2016    No Known Allergies  Medications: Patient's Medications  New Prescriptions   No medications on file  Previous Medications   ACETAMINOPHEN (TYLENOL) 325 MG TABLET    Take 2 tablets (650 mg total) by mouth every 6 (six) hours as needed for mild pain (or Fever >/= 101).   ALBUTEROL (PROVENTIL) (2.5 MG/3ML) 0.083% NEBULIZER SOLUTION    Take 3 mLs (2.5 mg total) by nebulization every 6 (six) hours as needed for wheezing or shortness of breath.   ASPIRIN EC 81 MG TABLET    Take 81 mg by mouth daily at 12 noon.   CEPHALEXIN (KEFLEX) 500 MG CAPSULE    Take 1 capsule (500 mg total) by mouth daily.   FLUCONAZOLE (DIFLUCAN) 100 MG TABLET     Take 1 tablet (100 mg total) by mouth daily.   HYDROCODONE-ACETAMINOPHEN (NORCO/VICODIN) 5-325 MG TABLET    Take 1 tablet by mouth every 6 (six) hours as needed.   METOPROLOL TARTRATE (LOPRESSOR) 25 MG TABLET    Take 0.5 tablets (12.5 mg total) by mouth 2 (two) times daily.   NITROGLYCERIN (NITROSTAT) 0.4 MG SL TABLET    Place 0.4 mg under the tongue every 5 (five) minutes as needed for chest pain.   NUTRITIONAL SUPPLEMENTS (FEEDING SUPPLEMENT, NEPRO CARB STEADY,) LIQD    Take 237 mLs by mouth daily.   SODIUM BICARBONATE 650 MG TABLET    Take 650 mg by mouth 4 (four) times daily.  Modified Medications  No medications on file  Discontinued Medications   No medications on file    Review of Systems  Unable to perform ROS: Other    Vitals:   05/21/16 1031  BP: 138/70  Pulse: 70  Resp: 20  Temp: 98 F (36.7 C)  TempSrc: Oral  SpO2: 98%  Weight: 110 lb (49.9 kg)  Height: 5\' 11"  (1.803 m)   Body mass index is 15.34 kg/m.  Physical Exam  Constitutional: He appears well-developed.  Frail appearing in NAD, sitting up in bed  HENT:  Mouth/Throat: Oropharynx is clear and moist.  Eyes: Pupils are equal, round, and reactive to light. No scleral icterus.  Neck: Neck supple. Carotid bruit is not present. No thyromegaly present.  Cardiovascular: Normal rate, regular rhythm and intact distal pulses.  Exam reveals no gallop and no friction rub.   Murmur (1/6 SEM) heard. no distal LE swelling. No calf TTP  Pulmonary/Chest: Effort normal and breath sounds normal. No respiratory distress. He has no wheezes. He has no rhonchi. He has no rales. He exhibits no tenderness.  Abdominal: Soft. Bowel sounds are normal. He exhibits no distension, no abdominal bruit, no pulsatile midline mass and no mass. There is no hepatomegaly. There is no tenderness. There is no rebound and no guarding.  Suprapubic cath intact with no d/c or redness at insertion site  Genitourinary:  Genitourinary Comments: B/l  nephrostomy tube intact and DTG; L>R bag with clear yellow drainage; suprapubic foley cath DTG olive colored urine/bowel. No bloody d/c  Musculoskeletal: He exhibits edema.  Lymphadenopathy:    He has no cervical adenopathy.  Neurological: He is alert.  Skin: Skin is warm and dry. No rash noted.  Psychiatric: He has a normal mood and affect. His behavior is normal.     Labs reviewed: Admission on 05/07/2016, Discharged on 05/15/2016  No results displayed because visit has over 200 results.    Office Visit on 05/07/2016  Component Date Value Ref Range Status  . WBC 05/07/2016 22.4 Repeated and verified X2.* 4.0 - 10.5 K/uL Final  . RBC 05/07/2016 3.49* 4.22 - 5.81 Mil/uL Final  . Platelets 05/07/2016 251.0  150.0 - 400.0 K/uL Final  . Hemoglobin 05/07/2016 10.2* 13.0 - 17.0 g/dL Final  . HCT 05/07/2016 31.1* 39.0 - 52.0 % Final  . MCV 05/07/2016 89.3  78.0 - 100.0 fl Final  . MCHC 05/07/2016 32.7  30.0 - 36.0 g/dL Final  . RDW 05/07/2016 18.7* 11.5 - 15.5 % Final  . Sodium 05/07/2016 133* 135 - 145 mEq/L Final  . Potassium 05/07/2016 5.3* 3.5 - 5.1 mEq/L Final  . Chloride 05/07/2016 101  96 - 112 mEq/L Final  . CO2 05/07/2016 17* 19 - 32 mEq/L Final  . Glucose, Bld 05/07/2016 145* 70 - 99 mg/dL Final  . BUN 05/07/2016 63* 6 - 23 mg/dL Final  . Creatinine, Ser 05/07/2016 3.19* 0.40 - 1.50 mg/dL Final  . Total Bilirubin 05/07/2016 0.5  0.2 - 1.2 mg/dL Final  . Alkaline Phosphatase 05/07/2016 122* 39 - 117 U/L Final  . AST 05/07/2016 76* 0 - 37 U/L Final  . ALT 05/07/2016 71* 0 - 53 U/L Final  . Total Protein 05/07/2016 6.7  6.0 - 8.3 g/dL Final  . Albumin 05/07/2016 3.0* 3.5 - 5.2 g/dL Final  . Calcium 05/07/2016 8.9  8.4 - 10.5 mg/dL Final  . GFR 05/07/2016 19.70* >60.00 mL/min Final  . PSA 05/07/2016 0.00* 0.10 - 4.00 ng/mL Final  Nursing Home on 04/17/2016  Component  Date Value Ref Range Status  . Hemoglobin 04/17/2016 8.1* 13.5 - 17.5 g/dL Final  . HCT 04/17/2016 27* 41 -  53 % Final  . Platelets 04/17/2016 195  150 - 399 K/L Final  . WBC 04/17/2016 9.1  10^3/mL Final  Nursing Home on 04/13/2016  Component Date Value Ref Range Status  . Hemoglobin 04/11/2016 7.7* 13.5 - 17.5 g/dL Final  . HCT 04/11/2016 25* 41 - 53 % Final  . Platelets 04/11/2016 185  150 - 399 K/L Final  . WBC 04/11/2016 9.6  10^3/mL Final  Admission on 03/28/2016, Discharged on 04/05/2016  No results displayed because visit has over 200 results.      Ct Abdomen Pelvis Wo Contrast  Result Date: 05/08/2016 CLINICAL DATA:  Initial evaluation for leukocytosis with bilateral or urostomy tubes in place. Also with acute abdominal pain. EXAM: CT ABDOMEN AND PELVIS WITHOUT CONTRAST TECHNIQUE: Multidetector CT imaging of the abdomen and pelvis was performed following the standard protocol without IV contrast. COMPARISON:  Prior CT from 03/28/2016. FINDINGS: Lower chest: Mild scatter atelectatic changes present within the visualized left lung base. Calcified granuloma present within the right middle lobe. Visualized lung bases are otherwise unremarkable. Hepatobiliary: Limited noncontrast evaluation of the liver is unremarkable. Hyperdensity within the gallbladder lumen likely reflects stones. No CT findings to suggest acute cholecystitis. No biliary dilatation. Pancreas: Pancreas within normal limits without acute abnormality. Spleen: Spleen within normal limits. Adrenals/Urinary Tract: Adrenal glands within normal limits. Bilateral percutaneous urostomy tubes in place. Distal aspects of the tube is coiled within the renal pelvises bilaterally. There is moderate left hydroureteronephrosis. No right-sided hydronephrosis or hydroureter. Bladder largely decompressed with a suprapubic catheter in place. Sub cm curvilinear hyperdensities within the posterior bladder lumen near the expected location of the left UVJ may reflect small stones. These were present on prior exam, although they are present on the right  on that previous study, suggesting at these are mobile. Stomach/Bowel: Stomach within normal limits. No evidence for bowel obstruction. Right lower quadrant ostomy present. No definite acute inflammatory changes about the bowels. Vascular/Lymphatic: Prominent atheromatous plaque throughout the intra-abdominal aorta. Aorta again noted to be dilated up to 2.6 cm in diameter. No definite adenopathy identified. Reproductive: Prostate appears to be surgically absent. Other: Diffuse anasarca. No free air. Small amount of free fluid within the presacral space. Musculoskeletal: No acute osseous abnormality. No worrisome lytic or blastic osseous lesions. Degenerative spondylolysis and facet arthrosis noted within the lumbar spine. IMPRESSION: 1. Bilateral percutaneous nephrostomy tubes in place. There is moderate left hydroureteronephrosis. Bedside check to ensure that the left tube is functioning is recommended. Right renal collecting system is decompressed. Bladder decompressed with a suprapubic catheter in place. 2. No other definite acute intra-abdominal or pelvic process identified. 3. Cholelithiasis without evidence for acute cholecystitis. 4. Anasarca. 5. Ectatic intra-abdominal aorta with atherosclerosis, stable from recent CT. Please see prior report for follow-up recommendations regarding this finding. Electronically Signed   By: Jeannine Boga M.D.   On: 05/08/2016 02:49   Dg Chest 2 View  Result Date: 05/07/2016 CLINICAL DATA:  Acute onset of generalized weakness. Upper abdominal and chest pain. Leukocytosis. Initial encounter. EXAM: CHEST  2 VIEW COMPARISON:  None. FINDINGS: The lungs are well-aerated. There is no evidence of focal opacification, pleural effusion or pneumothorax. A vague curved density is noted overlying the right midlung zone, of uncertain significance. There are a vague linear lucencies surrounding the superior mediastinum on the frontal view. A large area of apparent air  is noted  at the anterior mediastinum, on the lateral view. Air appears to surround the heart posteriorly. Findings are concerning for a very large amount of pneumomediastinum. The heart is normal in size; the patient is status post median sternotomy, with an aortic valve replacement seen. No acute osseous abnormalities are seen. IMPRESSION: 1. Lungs clear bilaterally. Vague curved density overlying the right midlung zone is of uncertain significance. 2. Suspect very large amount of pneumomediastinum. CT of the chest is recommended for further evaluation. Critical Value/emergent results were called by telephone at the time of interpretation on 05/07/2016 at 10:53 pm to Dr. Regenia Skeeter, who verbally acknowledged these results. Electronically Signed   By: Garald Balding M.D.   On: 05/07/2016 22:55   Ct Chest Wo Contrast  Result Date: 05/08/2016 CLINICAL DATA:  Leukocytosis, question of pneumomediastinum seen on chest radiograph EXAM: CT CHEST WITHOUT CONTRAST TECHNIQUE: Multidetector CT imaging of the chest was performed following the standard protocol without IV contrast. COMPARISON:  Same day chest radiograph, bases from CT abdomen dated 03/28/2016. FINDINGS: Cardiovascular: The patient is status post median sternotomy and aortic valvular replacement. Mild fusiform ectasia of the ascending aorta measuring up to 3.9 cm. The descending aortic aneurysm. Atheromatous calcifications of the aorta. Dense coronary arteriosclerosis is identified. The heart is normal in size. There is no pericardial effusion. Mediastinum/Nodes: No pneumomediastinum. Skin fold artifact likely the cause of curvilinear density seen on same day chest radiograph. Calcified appearing subcarinal lymph nodes consistent with old granulomatous disease. Mild thyromegaly. Lungs/Pleura: 10 x 7 mm circumscribed noncalcified nodule in the left lower lobe medially series 5, image 103. Noncalcified right middle lobe lateral segment 2 mm nodular density, series 5,  image 82, calcified right lower lobe 3 mm nodule series 5, image 85 and a calcified 5 x 6 mm nodule the right middle lobe abutting the major fissure. No pneumothorax, effusion or pulmonary consolidation. Upper Abdomen: Splenic calcifications consistent with old granulomatous disease. There is mild ectasia and/or dilatation of the left renal collecting system. No definite source of obstruction visualized. PE few rounded calcification also noted associated with the pancreas. Musculoskeletal: No acute osseous abnormality. IMPRESSION: No pneumomediastinum. Skin fold artifact likely the cause of curvilinear density seen on chest radiograph over the right hemithorax. Calcified and noncalcified pulmonary nodules. This in conjunction with calcified subcarinal and splenic calcifications likely reflect old granulomatous disease. There is however a dominant noncalcified nodule in the left lower lobe measuring 10 x 7 mm, mean 8.5 mm. Non-contrast chest CT at 3-6 months is recommended. If the nodules are stable at time of repeat CT, then future CT at 18-24 months (from 9/17 ct) is considered optional for low-risk patients, all but is recommended for high-risk patients. This recommendation follows the consensus statement: Guidelines for Management of Incidental Pulmonary Nodules Detected on CT Images:From the Fleischner Society 2017; published online before print (10.1148/radiol.IJ:2314499). Electronically Signed   By: Ashley Royalty M.D.   On: 05/08/2016 00:31   Dg Cystogram  Result Date: 05/10/2016 CLINICAL DATA:  Foul smelling head drainage from the urinary bladder, clinical concern for colovesical fistula EXAM: CYSTOGRAM TECHNIQUE: After catheterization of the urinary bladder following sterile technique the bladder was filled with 120 mL Cysto-Hypaque 30% by drip infusion. Serial spot images were obtained during bladder filling and post draining. FLUOROSCOPY TIME:  Fluoroscopy Time:  2 minutes, 36 seconds Radiation Exposure  Index (if provided by the fluoroscopic device): 14.3 Number of Acquired Spot Images: 0 COMPARISON:  05/08/2016 FINDINGS: Initial fluoroscopy over the  abdomen demonstrates bilateral nephrostomy tubes along with lumbar spondylosis. I injected the urinary bladder through the super pubic catheter. The urinary bladder is irregular inferiorly were there is a large filling defect. There is prompt posterior drainage into the rectum. The whole posterior portion of the urinary bladder is irregular. With turning of the patient under fluoroscopic observation, I believe that the fistulous connection to the rectum is directly posterior to the urinary bladder in the midline. We are able to retrograde fill the distal sigmoid colon through injection of the suprapubic catheter. IMPRESSION: 1. 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 This caused retrograde filling of the sigmoid colon as I injected the urinary bladder. 2. Filling defect along the bottom of the urinary bladder is nonspecific. This might be from an enlarged prostate gland but blood clot or tumor is not excluded. 3. The patient had difficulty turning due to infirmity, which limited our exam and our ability to obtain images at different angles. Electronically Signed   By: Van Clines M.D.   On: 05/10/2016 13:16   Ir Nephrostomy Exchange Left  Result Date: 05/16/2016 INDICATION: Indwelling bilateral percutaneous nephrostomy tubes with fistula between bladder and rectum. Some hydronephrosis noted by recent imaging. Request has been made to perform nephrostogram set and nephrostomy tube changes. EXAM: IR EXCHANGE NEPHROSTOMY LEFT; IR EXCHANGE NEPHROSTOMY RIGHT COMPARISON:  Imaging during tube placement on 04/02/2016 and CT of the abdomen and pelvis on 05/08/2016 MEDICATIONS: None ANESTHESIA/SEDATION: None CONTRAST:  20 mL Isovue-300 - administered into the collecting system(s) FLUOROSCOPY TIME:  Fluoroscopy Time:  3.0 minutes 30 seconds (27 mGy). COMPLICATIONS: None immediate. PROCEDURE: Informed written consent was obtained from the patient after a thorough discussion of the procedural risks, benefits and alternatives. All questions were addressed. Maximal Sterile Barrier Technique was utilized including caps, mask, sterile gowns, sterile gloves, sterile drape, hand hygiene and skin antiseptic. A timeout was performed prior to the initiation of the procedure. Both nephrostomy tubes were injected with contrast material and fluoroscopic imaging performed. Both tubes were then removed over guidewires and exchanged for new 10 French nephrostomy tubes. These were formed and injected with contrast to confirm position. Both tubes were connected to new gravity drainage bags. The were secured at the skin with a StatLock devices. FINDINGS: Injection of both nephrostomy tubes demonstrate fairly symmetric appearance of mild chronic hydroureteronephrosis bilaterally. There is poor and sluggish antegrade flow of contrast in both ureters. No contrast extravasation identified. New nephrostomy tubes were formed in the central collecting system bilaterally. IMPRESSION: Bilateral nephrostograms demonstrate bilateral chronic hydronephrosis and hydroureter with poor/sluggish flow of contrast in both ureters. Bilateral 10 French nephrostomy tubes were exchanged and connected to gravity drainage bags. Electronically Signed   By: Aletta Edouard M.D.   On: 05/16/2016 13:18   Ir Nephrostomy Exchange Right  Result Date: 05/16/2016 INDICATION: Indwelling bilateral percutaneous nephrostomy tubes with fistula between bladder and rectum. Some hydronephrosis noted by recent imaging. Request has been made to perform nephrostogram set and nephrostomy tube changes. EXAM: IR EXCHANGE NEPHROSTOMY LEFT; IR EXCHANGE NEPHROSTOMY RIGHT COMPARISON:  Imaging during tube placement on 04/02/2016 and CT of the abdomen and pelvis on 05/08/2016 MEDICATIONS: None  ANESTHESIA/SEDATION: None CONTRAST:  20 mL Isovue-300 - administered into the collecting system(s) FLUOROSCOPY TIME:  Fluoroscopy Time: 3.0 minutes 30 seconds (27 mGy). COMPLICATIONS: None immediate. PROCEDURE: Informed written consent was obtained from the patient after a thorough discussion of the procedural risks, benefits and alternatives. All questions were addressed. Maximal Sterile  Barrier Technique was utilized including caps, mask, sterile gowns, sterile gloves, sterile drape, hand hygiene and skin antiseptic. A timeout was performed prior to the initiation of the procedure. Both nephrostomy tubes were injected with contrast material and fluoroscopic imaging performed. Both tubes were then removed over guidewires and exchanged for new 10 French nephrostomy tubes. These were formed and injected with contrast to confirm position. Both tubes were connected to new gravity drainage bags. The were secured at the skin with a StatLock devices. FINDINGS: Injection of both nephrostomy tubes demonstrate fairly symmetric appearance of mild chronic hydroureteronephrosis bilaterally. There is poor and sluggish antegrade flow of contrast in both ureters. No contrast extravasation identified. New nephrostomy tubes were formed in the central collecting system bilaterally. IMPRESSION: Bilateral nephrostograms demonstrate bilateral chronic hydronephrosis and hydroureter with poor/sluggish flow of contrast in both ureters. Bilateral 10 French nephrostomy tubes were exchanged and connected to gravity drainage bags. Electronically Signed   By: Aletta Edouard M.D.   On: 05/16/2016 13:18     Assessment/Plan   ICD-9-CM ICD-10-CM   1. Nephrostomy status (HCC) V44.6 Z93.6   2. UTI (urinary tract infection) due to Enterococcus 599.0 N39.0    041.04 B95.2    ESBL (+) with associated pseudomonas and yeast  3. Protein-calorie malnutrition, severe 262 E43   4. Stage 3 chronic kidney disease 585.3 N18.3   5. History of  prostate cancer V10.46 Z85.46   6. Enterovesical fistula 596.1 N32.1   7. Suprapubic catheter ([place]) V44.59 Z93.59   8. SVT (supraventricular tachycardia) (HCC) 427.89 I47.1   9. Colostomy in place [facility]) V44.3 Z93.3   10. Valvular heart disease 424.90 I38    s/p presumed bioprosthetic valve replacement  11. Hx of CABG V45.81 Z95.1   12. Anemia in stage 3 chronic kidney disease 285.21 N18.3    585.3 D63.1     Cont current meds as ordered  Check CBC and BMP to follow renal fxn and anemia  Palliative care to follow. Will likely require hospice as per hospital, UTI will not be eradicated  PT/OT/ST as tolerated  GOAL: short term rehab with potential for long term care. Palliative care and possible hospice. Communicated with pt and nursing.  Will follow  Erasmo Vertz S. Perlie Gold  [place] [facility] [address] [place], [place] [address] [phone] Cell (Monday-Friday 8 AM - 5 PM) [phone] After 5 PM and follow prompts

## 2016-05-29 ENCOUNTER — Non-Acute Institutional Stay (SKILLED_NURSING_FACILITY): Payer: Medicare Other | Admitting: Adult Health

## 2016-05-29 ENCOUNTER — Encounter: Payer: Self-pay | Admitting: Adult Health

## 2016-05-29 DIAGNOSIS — K5901 Slow transit constipation: Secondary | ICD-10-CM

## 2016-05-29 DIAGNOSIS — K219 Gastro-esophageal reflux disease without esophagitis: Secondary | ICD-10-CM | POA: Diagnosis not present

## 2016-05-29 DIAGNOSIS — R066 Hiccough: Secondary | ICD-10-CM

## 2016-05-29 NOTE — Progress Notes (Signed)
Patient ID: Joseph Carrillo, male   DOB: 11/26/1926, 82 y.o.   MRN: [id number]   Location:   [facility] Room Number: 118-A Place of Service:  SNF (31)   CODE STATUS: DNR  No Known Allergies  Chief Complaint  Patient presents with  . Acute Visit    Nausea and Vomiting    HPI:  He is complaining constipation indigestion and hiccups. He is wanting something for his concerns. He states he has had these concerns for the past several days.   Past Medical History:  Diagnosis Date  . Blood in stool   . Blood transfusion without reported diagnosis   . CAD (coronary artery disease)    a. 2000 - Says he had CABG x 3 or 4.  . Chicken pox   . Colon polyp   . Elevated blood pressure reading   . Glaucoma   . Heart murmur   . Hx: UTI (urinary tract infection)   . Hyperlipidemia   . Prostate cancer ([place])   . Valvular heart disease    a. 2005 s/p Valve replacement - presumably bioprosthetic (not on anticoagulation).  He doesn't know which valve.    Past Surgical History:  Procedure Laterality Date  . CORONARY ARTERY BYPASS GRAFT    . IR GENERIC HISTORICAL  04/02/2016   IR NEPHROSTOMY PLACEMENT LEFT 04/02/2016 Sandi Mariscal, MD [facility]  . IR GENERIC HISTORICAL  04/02/2016   IR NEPHROSTOMY PLACEMENT RIGHT 04/02/2016 Sandi Mariscal, MD [facility]  . IR GENERIC HISTORICAL  05/15/2016   IR NEPHROSTOMY EXCHANGE LEFT 05/15/2016 Aletta Edouard, MD [facility]  . IR GENERIC HISTORICAL  05/15/2016   IR NEPHROSTOMY EXCHANGE RIGHT 05/15/2016 Aletta Edouard, MD [facility]  . LUNG SURGERY    . PROSTATE SURGERY      Social History   Social History  . Marital status: Married    Spouse name: N/A  . Number of children: N/A  . Years of education: N/A   Occupational History  . Not on file.   Social History Main Topics  . Smoking status: Former Smoker    Quit date: 07/24/1971  . Smokeless tobacco: Never Used  . Alcohol use No     Comment: prev drank.  quit 1973 or 1974.  .  Drug use: No  . Sexual activity: Not on file   Other Topics Concern  . Not on file   Social History Narrative   Lives in [place], [place] area by himself but is currently staying with his sister in [place] area after prolonged hospitalization this summer.   Family History  Problem Relation Age of Onset  . Heart disease Father     Pt says his brother had heart infection and died suddenly.      VITAL SIGNS BP (!) 119/56   Pulse 79   Temp 98 F (36.7 C)   Resp 18   Ht '5\' 11"'  (1.803 m)   Wt 110 lb (49.9 kg)   SpO2 98%   BMI 15.34 kg/m   Patient's Medications  New Prescriptions   No medications on file  Previous Medications   ACETAMINOPHEN (TYLENOL) 325 MG TABLET    Take 2 tablets (650 mg total) by mouth every 6 (six) hours as needed for mild pain (or Fever >/= 101).   ALBUTEROL (PROVENTIL) (2.5 MG/3ML) 0.083% NEBULIZER SOLUTION    Take 3 mLs (2.5 mg total) by nebulization every 6 (six) hours as needed for wheezing or shortness of breath.   ASPIRIN EC  81 MG TABLET    Take 81 mg by mouth daily at 12 noon.   BUSPIRONE (BUSPAR) 15 MG TABLET    Take 15 mg by mouth 2 (two) times daily.   CEPHALEXIN (KEFLEX) 500 MG CAPSULE    Take 1 capsule (500 mg total) by mouth daily.   FLUCONAZOLE (DIFLUCAN) 100 MG TABLET    Take 1 tablet (100 mg total) by mouth daily.   HYDROCODONE-ACETAMINOPHEN (NORCO/VICODIN) 5-325 MG TABLET    Take 1 tablet by mouth every 6 (six) hours as needed.   METOPROLOL TARTRATE (LOPRESSOR) 25 MG TABLET    Take 0.5 tablets (12.5 mg total) by mouth 2 (two) times daily.   NITROGLYCERIN (NITROSTAT) 0.4 MG SL TABLET    Place 0.4 mg under the tongue every 5 (five) minutes as needed for chest pain.   NUTRITIONAL SUPPLEMENTS (FEEDING SUPPLEMENT, NEPRO CARB STEADY,) LIQD    Take 237 mLs by mouth daily.   SODIUM BICARBONATE 650 MG TABLET    Take 650 mg by mouth 4 (four) times daily.  Modified Medications   No medications on file  Discontinued Medications   No medications on file      SIGNIFICANT DIAGNOSTIC EXAMS  05-07-16: chest x-ray: 1. Lungs clear bilaterally. Vague curved density overlying the right midlung zone is of uncertain significance. 2. Suspect very large amount of pneumomediastinum. CT of the chest is recommended for further evaluation.  05-07-16: ct of chest: No pneumomediastinum. Skin fold artifact likely the cause of curvilinear density seen on chest radiograph over the right hemithorax.  Calcified and noncalcified pulmonary nodules. This in conjunction with calcified subcarinal and splenic calcifications likely reflect old granulomatous disease. There is however a dominant noncalcified nodule in the left lower lobe measuring 10 x 7 mm, mean 8.5 mm. Non-contrast chest CT at 3-6 months is recommended. If the nodules are stable at time of repeat CT, then future CT at 18-24 months (from 9/17 ct) is considered optional for low-risk patients, all but is recommended for high-risk patients.   05-08-16: 2-d echo: Left ventricle: The cavity size was normal. Systolic function was normal. The estimated ejection fraction was in the range of 55%  to 60%. Wall motion was normal; there were no regional wall motion abnormalities. Features are consistent with a pseudonormal left ventricular filling pattern, with concomitant abnormal relaxation and increased filling pressure (grade 2 diastolic dysfunction). - Aortic valve: Severely calcified annulus. Trileaflet; normal thickness, mildly calcified leaflets. There was mild stenosis. There was trivial regurgitation.  - Mitral valve: Calcified annulus. Mildly thickened leaflets . Moderate thickening of the anterior leaflet. There was mild regurgitation. - Left atrium: The atrium was mildly dilated. - Pulmonary arteries: PA peak pressure: 35 mm Hg (S).  05-08-16: ct of abdomen and pelvis: 1. Bilateral percutaneous nephrostomy tubes in place. There is moderate left hydroureteronephrosis. Bedside check to ensure that the left tube  is functioning is recommended. Right renal collecting system is decompressed. Bladder decompressed with a suprapubic catheter in place. 2. No other definite acute intra-abdominal or pelvic process identified. 3. Cholelithiasis without evidence for acute cholecystitis. 4. Anasarca. 5. Ectatic intra-abdominal aorta with atherosclerosis, stable from recent CT. Please see prior report for follow-up recommendations regarding this finding.  05-10-16: cystogram: 1. 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 This caused retrograde filling of the sigmoid colon as I injected the urinary bladder. 2. Filling defect along the bottom of the urinary bladder is nonspecific. This might be from an enlarged prostate  gland but blood clot or tumor is not excluded. 3. The patient had difficulty turning due to infirmity, which limited our exam and our ability to obtain images at different angles.    LABS REVIEWED:   05-07-16: wbc 22.4; hgb 10.2; hct 31.1; mcv 89.3; plt 251; glucose 145; bun 63; creat 3.19; k+ 5.3; na++ 133; alk phos 122; ast 76; alt 71; albumin 3.0; blood culture: no growth 05-08-16: wbc 21.1; hgb 8.1; hct 25.5; mcv 88.9; plt 290; glucose 161; bun 64; creat 3.06 ;k+ 5.1; na+ +133; ast 54; alt 57; albumin 2.5; tsh 0.694; pre-albumin 8.4;  05-09-16: urine cultures: e-coli  05-10-16: wbc 12.3; hgb 8.4; hct 25.8; mcv 88.1; plt 308; glucose 103; bun 57; creat 2.59; k+ 5.1; na++ 137; liver normal albumin 2.2; iron 11; tibc 108; vit B 12: 478; folate 9.0 05-15-16; wbc 10.0; hgb 7.5; hct 24.2 ;mcv 93.1; plt 220; glucose 92; bun 39; creat 2.06; k+ 5.7; na++ 141; mag 1.4; cortisol 12.1    Review of Systems  Constitutional: Negative for malaise/fatigue.       Hiccups   Respiratory: Negative for cough and shortness of breath.   Cardiovascular: Negative for chest pain, palpitations and leg swelling.  Gastrointestinal: Positive for constipation and heartburn.  Negative for abdominal pain.  Musculoskeletal: Negative for back pain, joint pain and myalgias.  Skin: Negative.   Neurological: Negative for dizziness.  Psychiatric/Behavioral: The patient is not nervous/anxious.       Physical Exam  Constitutional: No distress.  Frail   Eyes: Conjunctivae are normal.  Neck: Neck supple. No JVD present. No thyromegaly present.  Cardiovascular: Normal rate, regular rhythm and intact distal pulses.   Respiratory: Effort normal and breath sounds normal. No respiratory distress. He has no wheezes.  GI: Soft. Bowel sounds are normal. He exhibits no distension. There is no tenderness.  Colostomy   Genitourinary:  Genitourinary Comments: Suprapubic catheter  Nephrostomy stent X 2   Musculoskeletal: He exhibits no edema.  Able to move all extremities   Lymphadenopathy:    He has no cervical adenopathy.  Neurological: He is alert.  Skin: Skin is warm and dry. He is not diaphoretic.  Psychiatric: He has a normal mood and affect.     ASSESSMENT/ PLAN:  1. gerd 2. Constipation 3. Hiccups Will begin zantac 150 mg twice daily  Colace twice daily  Baclofen 5 mg three times daily as needed   MD is aware of resident's narcotic use and is in agreement with current plan of care. We will attempt to wean resident as appropriate.    Ok Edwards NP [facility]  Contact [phone] Monday through Friday 8am- 5pm  After hours call [phone]

## 2016-05-30 ENCOUNTER — Other Ambulatory Visit: Payer: Self-pay | Admitting: Urology

## 2016-05-30 DIAGNOSIS — N1330 Unspecified hydronephrosis: Secondary | ICD-10-CM

## 2016-06-04 ENCOUNTER — Telehealth: Payer: Self-pay | Admitting: Internal Medicine

## 2016-06-04 NOTE — Telephone Encounter (Signed)
S/w pt's daughter, [name], by phone regarding pt's current condition. She is interested in him transferring to [facility] if possible. Will notify SW to get him evaluated at family request. All questions answered about his care.

## 2016-06-10 ENCOUNTER — Inpatient Hospital Stay (HOSPITAL_COMMUNITY)
Admission: EM | Admit: 2016-06-10 | Discharge: 2016-06-12 | DRG: 698 | Disposition: A | Discharge status: Hospice | Payer: Medicare Other | Attending: Nephrology | Admitting: Nephrology

## 2016-06-10 ENCOUNTER — Encounter (HOSPITAL_COMMUNITY): Payer: Self-pay | Admitting: Emergency Medicine

## 2016-06-10 ENCOUNTER — Emergency Department (HOSPITAL_COMMUNITY): Payer: Medicare Other

## 2016-06-10 DIAGNOSIS — Z7982 Long term (current) use of aspirin: Secondary | ICD-10-CM

## 2016-06-10 DIAGNOSIS — Z993 Dependence on wheelchair: Secondary | ICD-10-CM

## 2016-06-10 DIAGNOSIS — N179 Acute kidney failure, unspecified: Secondary | ICD-10-CM | POA: Diagnosis present

## 2016-06-10 DIAGNOSIS — K632 Fistula of intestine: Secondary | ICD-10-CM | POA: Diagnosis present

## 2016-06-10 DIAGNOSIS — E875 Hyperkalemia: Secondary | ICD-10-CM | POA: Diagnosis present

## 2016-06-10 DIAGNOSIS — T83512A Infection and inflammatory reaction due to nephrostomy catheter, initial encounter: Secondary | ICD-10-CM | POA: Diagnosis present

## 2016-06-10 DIAGNOSIS — R7989 Other specified abnormal findings of blood chemistry: Secondary | ICD-10-CM | POA: Diagnosis not present

## 2016-06-10 DIAGNOSIS — N183 Chronic kidney disease, stage 3 (moderate): Secondary | ICD-10-CM | POA: Diagnosis present

## 2016-06-10 DIAGNOSIS — D72829 Elevated white blood cell count, unspecified: Secondary | ICD-10-CM | POA: Diagnosis present

## 2016-06-10 DIAGNOSIS — E785 Hyperlipidemia, unspecified: Secondary | ICD-10-CM | POA: Diagnosis present

## 2016-06-10 DIAGNOSIS — I2510 Atherosclerotic heart disease of native coronary artery without angina pectoris: Secondary | ICD-10-CM | POA: Diagnosis present

## 2016-06-10 DIAGNOSIS — Z951 Presence of aortocoronary bypass graft: Secondary | ICD-10-CM | POA: Diagnosis not present

## 2016-06-10 DIAGNOSIS — R627 Adult failure to thrive: Secondary | ICD-10-CM | POA: Diagnosis present

## 2016-06-10 DIAGNOSIS — C61 Malignant neoplasm of prostate: Secondary | ICD-10-CM | POA: Diagnosis present

## 2016-06-10 DIAGNOSIS — Z79899 Other long term (current) drug therapy: Secondary | ICD-10-CM

## 2016-06-10 DIAGNOSIS — L89151 Pressure ulcer of sacral region, stage 1: Secondary | ICD-10-CM | POA: Diagnosis present

## 2016-06-10 DIAGNOSIS — Y846 Urinary catheterization as the cause of abnormal reaction of the patient, or of later complication, without mention of misadventure at the time of the procedure: Secondary | ICD-10-CM | POA: Diagnosis present

## 2016-06-10 DIAGNOSIS — Z953 Presence of xenogenic heart valve: Secondary | ICD-10-CM | POA: Diagnosis not present

## 2016-06-10 DIAGNOSIS — Z8546 Personal history of malignant neoplasm of prostate: Secondary | ICD-10-CM | POA: Diagnosis not present

## 2016-06-10 DIAGNOSIS — N189 Chronic kidney disease, unspecified: Secondary | ICD-10-CM

## 2016-06-10 DIAGNOSIS — E860 Dehydration: Secondary | ICD-10-CM | POA: Diagnosis present

## 2016-06-10 DIAGNOSIS — Z87891 Personal history of nicotine dependence: Secondary | ICD-10-CM | POA: Diagnosis not present

## 2016-06-10 DIAGNOSIS — I4891 Unspecified atrial fibrillation: Secondary | ICD-10-CM | POA: Diagnosis present

## 2016-06-10 DIAGNOSIS — N390 Urinary tract infection, site not specified: Secondary | ICD-10-CM | POA: Diagnosis present

## 2016-06-10 DIAGNOSIS — E43 Unspecified severe protein-calorie malnutrition: Secondary | ICD-10-CM | POA: Diagnosis present

## 2016-06-10 DIAGNOSIS — Z66 Do not resuscitate: Secondary | ICD-10-CM | POA: Diagnosis present

## 2016-06-10 DIAGNOSIS — A419 Sepsis, unspecified organism: Secondary | ICD-10-CM | POA: Diagnosis present

## 2016-06-10 DIAGNOSIS — G9341 Metabolic encephalopathy: Secondary | ICD-10-CM | POA: Diagnosis present

## 2016-06-10 DIAGNOSIS — E876 Hypokalemia: Secondary | ICD-10-CM | POA: Diagnosis not present

## 2016-06-10 DIAGNOSIS — R5383 Other fatigue: Secondary | ICD-10-CM | POA: Diagnosis present

## 2016-06-10 DIAGNOSIS — Z515 Encounter for palliative care: Secondary | ICD-10-CM | POA: Diagnosis present

## 2016-06-10 DIAGNOSIS — Z681 Body mass index (BMI) 19 or less, adult: Secondary | ICD-10-CM

## 2016-06-10 DIAGNOSIS — L89159 Pressure ulcer of sacral region, unspecified stage: Secondary | ICD-10-CM | POA: Diagnosis present

## 2016-06-10 DIAGNOSIS — Z933 Colostomy status: Secondary | ICD-10-CM | POA: Diagnosis not present

## 2016-06-10 LAB — I-STAT CG4 LACTIC ACID, ED: Lactic Acid, Venous: 4.01 mmol/L (ref 0.5–1.9)

## 2016-06-10 LAB — URINE MICROSCOPIC-ADD ON
Squamous Epithelial / LPF: NONE SEEN
Squamous Epithelial / LPF: NONE SEEN

## 2016-06-10 LAB — CBC WITH DIFFERENTIAL/PLATELET
BASOS PCT: 0 %
Basophils Absolute: 0 10*3/uL (ref 0.0–0.1)
EOS ABS: 0 10*3/uL (ref 0.0–0.7)
Eosinophils Relative: 0 %
HCT: 32.5 % — ABNORMAL LOW (ref 39.0–52.0)
Hemoglobin: 10.6 g/dL — ABNORMAL LOW (ref 13.0–17.0)
LYMPHS ABS: 1 10*3/uL (ref 0.7–4.0)
Lymphocytes Relative: 3 %
MCH: 29.9 pg (ref 26.0–34.0)
MCHC: 32.6 g/dL (ref 30.0–36.0)
MCV: 91.5 fL (ref 78.0–100.0)
MONO ABS: 0.3 10*3/uL (ref 0.1–1.0)
Monocytes Relative: 1 %
NEUTROS PCT: 96 %
Neutro Abs: 30.7 10*3/uL — ABNORMAL HIGH (ref 1.7–7.7)
PLATELETS: 221 10*3/uL (ref 150–400)
RBC: 3.55 MIL/uL — ABNORMAL LOW (ref 4.22–5.81)
RDW: 15.4 % (ref 11.5–15.5)
WBC: 32 10*3/uL — AB (ref 4.0–10.5)

## 2016-06-10 LAB — URINALYSIS, ROUTINE W REFLEX MICROSCOPIC
BILIRUBIN URINE: NEGATIVE
BILIRUBIN URINE: NEGATIVE
GLUCOSE, UA: NEGATIVE mg/dL
GLUCOSE, UA: NEGATIVE mg/dL
KETONES UR: 15 mg/dL — AB
KETONES UR: NEGATIVE mg/dL
NITRITE: NEGATIVE
Nitrite: NEGATIVE
PROTEIN: 30 mg/dL — AB
Specific Gravity, Urine: 1.027 (ref 1.005–1.030)
Specific Gravity, Urine: 1.019 (ref 1.005–1.030)
pH: 8 (ref 5.0–8.0)
pH: 5 (ref 5.0–8.0)

## 2016-06-10 LAB — COMPREHENSIVE METABOLIC PANEL
ALT: 34 U/L (ref 17–63)
ANION GAP: 16 — AB (ref 5–15)
AST: 32 U/L (ref 15–41)
Albumin: 2.4 g/dL — ABNORMAL LOW (ref 3.5–5.0)
Alkaline Phosphatase: 218 U/L — ABNORMAL HIGH (ref 38–126)
BUN: 145 mg/dL — ABNORMAL HIGH (ref 6–20)
CHLORIDE: 102 mmol/L (ref 101–111)
CO2: 20 mmol/L — AB (ref 22–32)
CREATININE: 3.13 mg/dL — AB (ref 0.61–1.24)
Calcium: 9.5 mg/dL (ref 8.9–10.3)
GFR calc non Af Amer: 17 mL/min — ABNORMAL LOW (ref >60)
GFR, EST AFRICAN AMERICAN: 19 mL/min — AB (ref >60)
Glucose, Bld: 302 mg/dL — ABNORMAL HIGH (ref 65–99)
POTASSIUM: 5.5 mmol/L — AB (ref 3.5–5.1)
SODIUM: 138 mmol/L (ref 135–145)
Total Bilirubin: 0.6 mg/dL (ref 0.3–1.2)
Total Protein: 7.6 g/dL (ref 6.5–8.1)

## 2016-06-10 MED ORDER — SODIUM CHLORIDE 0.9 % IV SOLN
500.0000 mg | Freq: Once | INTRAVENOUS | Status: AC
  Administered 2016-06-10: 500 mg via INTRAVENOUS
  Filled 2016-06-10: qty 500

## 2016-06-10 MED ORDER — SODIUM CHLORIDE 0.9 % IV BOLUS (SEPSIS)
1000.0000 mL | Freq: Once | INTRAVENOUS | Status: AC
  Administered 2016-06-10: 1000 mL via INTRAVENOUS

## 2016-06-10 MED ORDER — SODIUM CHLORIDE 0.9 % IV BOLUS (SEPSIS)
500.0000 mL | Freq: Once | INTRAVENOUS | Status: AC
  Administered 2016-06-10: 500 mL via INTRAVENOUS

## 2016-06-10 NOTE — H&P (Signed)
Joseph Carrillo [id number] DOB: [birth year] DOA: 06/10/2016     PCP: [name], MD   Outpatient Specialists:   Patient coming from: From facility  [place]  Chief Complaint: Lethargy  HPI: Joseph Carrillo is a 82 y.o. male with medical history significant of prostate cancer, CKD, bilateral hydronephrosis status post nephrostomy tubes, malnutrition, anemia, leukocytosis, colostomy history of urostomy, CAD, valve disease with bioprosthetic valve replacement    Presented with decreasing mental status for the past week less verbal than usual has not been eating well and urine output has decreased. He was noted to have CBG up to 411 which is new for patient he also developed new sacrum wound for past 1 week. 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  His progression and decline has been discussed with family his wife as well as daughter. As per discussion with wife sheet this point wishes for him to be DO NOT RESUSCITATE DO NOT INTUBATE and concentrate mainly uncomfortable measures okay to administer IV fluids and antibiotics as needed. She was not interested in aggressive interventions  Then the patient used to live in [place] when he had repeated admissions for UTIs In the past urine culture only grew multiple bacteria colonies suggestive of contamination  Patient has recently been admitted  in September for failure to thrive and UTI that time he had leukocytosis up to 22 potassium was elevated 5.3 He was found to have bilateral hydronephrosis and was transferred to [facility] was seen in consult by urology. undergone nephrostomy tube placement and his creatinine has improved to the time of discharge was down to 2.2. Was discharged to SNF he came back in October 2017 with evidence of UTI and severe leukocytosis. Cystogram showed large fistulous communication between posterior bladder and rectum with  retrograde filling of sigmoid colon.  per general surgery Dr Hassell Done, patient is not a surgical candidate.  infectious disease Dr Linus Salmons was consulted and recommended oral diflucan and keflex for uti suppression. Palliative care was consulted   Regarding pertinent Chronic problems: CAD s/p CABG in 2000, valvular heart dzs s/p presumably bioprosthetic valve replacement in 2005 of note patient has History of prostate cancer requiring extensive surgical procedures a suprapubic catheter placement and ostomy bag. Note patient has history of SVT requiring again ache which she is on beta blocker Prostate cancer (HCC)status post resection patient endorsing pelvic pain, CTab pel, ct chest did not report metastatic spread, psa 0.  IN ER:  Temp (24hrs), Avg:97.5 F (36.4 C), Min:97.5 F (36.4 C), Max:97.5 F (36.4 C)     RR 17, 100% 106 BP 156/69  Lactic acid 4.01 WBC 32 hg 10.6  Cr 3.13 K 5.5   CT non acute   CXR -  No consolidation Following Medications were ordered in ER: Medications  imipenem-cilastatin (PRIMAXIN) 500 mg in sodium chloride 0.9 % 100 mL IVPB (500 mg Intravenous New Bag/Given 06/10/16 2229)  sodium chloride 0.9 % bolus 1,000 mL (0 mLs Intravenous Stopped 06/10/16 2229)  sodium chloride 0.9 % bolus 500 mL (500 mLs Intravenous New Bag/Given 06/10/16 2229)     Hospitalist was called for admission for sepsis  Review of Systems:    Pertinent positives include:  fatigue, weight loss   Constitutional:  No weight loss, night sweats, Fevers, chills, HEENT:  No headaches, Difficulty swallowing,Tooth/dental problems,Sore throat,  No sneezing, itching, ear ache, nasal congestion, post nasal drip,  Cardio-vascular:  No chest pain,  Orthopnea, PND, anasarca, dizziness, palpitations.no Bilateral lower extremity swelling  GI:  No heartburn, indigestion, abdominal pain, nausea, vomiting, diarrhea, change in bowel habits, loss of appetite, melena, blood in stool, hematemesis Resp:    no shortness of breath at rest. No dyspnea on exertion, No excess mucus, no productive cough, No non-productive cough, No coughing up of blood.No change in color of mucus.No wheezing. Skin:  no rash or lesions. No jaundice GU:  no dysuria, change in color of urine, no urgency or frequency. No straining to urinate.  No flank pain.  Musculoskeletal:  No joint pain or no joint swelling. No decreased range of motion. No back pain.  Psych:  No change in mood or affect. No depression or anxiety. No memory loss.  Neuro: no localizing neurological complaints, no tingling, no weakness, no double vision, no gait abnormality, no slurred speech, no confusion  As per HPI otherwise 10 point review of systems negative.   Past Medical History: Past Medical History:  Diagnosis Date  . Blood in stool   . Blood transfusion without reported diagnosis   . CAD (coronary artery disease)    a. 2000 - Says he had CABG x 3 or 4.  . Chicken pox   . Colon polyp   . Elevated blood pressure reading   . Glaucoma   . Heart murmur   . Hx: UTI (urinary tract infection)   . Hyperlipidemia   . Prostate cancer ([place])   . Valvular heart disease    a. 2005 s/p Valve replacement - presumably bioprosthetic (not on anticoagulation).  He doesn't know which valve.   Past Surgical History:  Procedure Laterality Date  . CORONARY ARTERY BYPASS GRAFT    . IR GENERIC HISTORICAL  04/02/2016   IR NEPHROSTOMY PLACEMENT LEFT 04/02/2016 Sandi Mariscal, MD [facility]  . IR GENERIC HISTORICAL  04/02/2016   IR NEPHROSTOMY PLACEMENT RIGHT 04/02/2016 Sandi Mariscal, MD [facility]  . IR GENERIC HISTORICAL  05/15/2016   IR NEPHROSTOMY EXCHANGE LEFT 05/15/2016 Aletta Edouard, MD [facility]  . IR GENERIC HISTORICAL  05/15/2016   IR NEPHROSTOMY EXCHANGE RIGHT 05/15/2016 Aletta Edouard, MD [facility]  . LUNG SURGERY    . PROSTATE SURGERY       Social History:  Ambulatory  wheelchair bound    reports that he quit smoking about  44 years ago. He has never used smokeless tobacco. He reports that he does not drink alcohol or use drugs.  Allergies:  No Known Allergies     Family History:   Family History  Problem Relation Age of Onset  . Heart disease Father     Pt says his brother had heart infection and died suddenly.    Medications: Prior to Admission medications   Medication Sig Start Date End Date Taking? Authorizing Provider  acetaminophen (TYLENOL) 325 MG tablet Take 2 tablets (650 mg total) by mouth every 6 (six) hours as needed for mild pain (or Fever >/= 101). 05/15/16  Yes Florencia Reasons, MD  albuterol (PROVENTIL) (2.5 MG/3ML) 0.083% nebulizer solution Take 3 mLs (2.5 mg total) by nebulization every 6 (six) hours as needed for wheezing or shortness of breath. 04/05/16  Yes Reyne Dumas, MD  Amino Acids-Protein Hydrolys (FEEDING SUPPLEMENT, PRO-STAT SUGAR FREE 64,) LIQD Take 30 mLs by mouth 2 (two) times daily.   Yes Historical Provider, MD  aspirin EC 81 MG tablet Take 81 mg by mouth daily at 12 noon.   Yes Historical Provider, MD  baclofen (LIORESAL) 5 mg  TABS tablet Take 5 mg by mouth every 8 (eight) hours as needed for muscle spasms.   Yes Historical Provider, MD  busPIRone (BUSPAR) 15 MG tablet Take 15 mg by mouth every 12 (twelve) hours as needed (agitation).    Yes Historical Provider, MD  cephALEXin (KEFLEX) 500 MG capsule Take 1 capsule (500 mg total) by mouth daily. 05/15/16  Yes Florencia Reasons, MD  docusate sodium (COLACE) 100 MG capsule Take 100 mg by mouth 2 (two) times daily.   Yes Historical Provider, MD  fluconazole (DIFLUCAN) 100 MG tablet Take 1 tablet (100 mg total) by mouth daily. 05/16/16  Yes Florencia Reasons, MD  HYDROcodone-acetaminophen (NORCO/VICODIN) 5-325 MG tablet Take 1 tablet by mouth every 6 (six) hours as needed. 05/17/16  Yes Lauree Chandler, NP  metoprolol tartrate (LOPRESSOR) 25 MG tablet Take 0.5 tablets (12.5 mg total) by mouth 2 (two) times daily. 04/05/16  Yes Reyne Dumas, MD    nitroGLYCERIN (NITROSTAT) 0.4 MG SL tablet Place 0.4 mg under the tongue every 5 (five) minutes as needed for chest pain.   Yes Historical Provider, MD  Nutritional Supplements (FEEDING SUPPLEMENT, NEPRO CARB STEADY,) LIQD Take 237 mLs by mouth daily. 05/16/16  Yes Florencia Reasons, MD  ranitidine (ZANTAC) 150 MG tablet Take 150 mg by mouth 2 (two) times daily.   Yes Historical Provider, MD  sodium bicarbonate 650 MG tablet Take 650 mg by mouth 4 (four) times daily.   Yes Historical Provider, MD  UNABLE TO FIND Take 120 mLs by mouth 2 (two) times daily. House 2.0 for caloric and weight support Med Pass   Yes Historical Provider, MD    Physical Exam: Patient Vitals for the past 24 hrs:  BP Temp Temp src Pulse Resp SpO2 Height Weight  06/10/16 2200 156/69 - - 106 17 100 % - -  06/10/16 2114 - 97.5 F (36.4 C) Rectal - - - 5\' 11"  (1.803 m) 49.9 kg (110 lb)  06/10/16 2048 132/80 - - 111 20 100 % - -    1. General:  in No Acute distress cachetic 2. Psychological: Alert Oriented To self   3. Head/ENT:    Dry Mucous Membranes                          Head Non traumatic, neck supple                           Poor Dentition 4. SKIN:  decreased Skin turgor,  Skin clean Dry skin breakdown on the sacrum 5. Heart: Regular rate and rhythm  Murmur, Rub or gallop 6. Lungs:  no wheezes or crackles   7. Abdomen: Soft,   non-tender, Non distended nephrostomy tubes and colostomy in place 8. Lower extremities: no clubbing, cyanosis, or edema 9. Neurologically Grossly intact, moving all 4 extremities equally   10. MSK: Normal range of motion   body mass index is 15.34 kg/m.  Labs on Admission:   Labs on Admission: I have personally reviewed following labs and imaging studies  CBC:  Recent Labs Lab 06/10/16 2133  WBC 32.0*  NEUTROABS 30.7*  HGB 10.6*  HCT 32.5*  MCV 91.5  PLT [id number]   Basic Metabolic Panel:  Recent Labs Lab 06/10/16 2133  NA 138  K 5.5*  CL 102  CO2 20*  GLUCOSE 302*  BUN  145*  CREATININE 3.13*  CALCIUM 9.5   GFR: Estimated Creatinine Clearance:  12 mL/min (by C-G formula based on SCr of 3.13 mg/dL (H)). Liver Function Tests:  Recent Labs Lab 06/10/16 2133  AST 32  ALT 34  ALKPHOS 218*  BILITOT 0.6  PROT 7.6  ALBUMIN 2.4*   No results for input(s): LIPASE, AMYLASE in the last 168 hours. No results for input(s): AMMONIA in the last 168 hours. Coagulation Profile: No results for input(s): INR, PROTIME in the last 168 hours. Cardiac Enzymes: No results for input(s): CKTOTAL, CKMB, CKMBINDEX, TROPONINI in the last 168 hours. BNP (last 3 results) No results for input(s): PROBNP in the last 8760 hours. HbA1C: No results for input(s): HGBA1C in the last 72 hours. CBG: No results for input(s): GLUCAP in the last 168 hours. Lipid Profile: No results for input(s): CHOL, HDL, LDLCALC, TRIG, CHOLHDL, LDLDIRECT in the last 72 hours. Thyroid Function Tests: No results for input(s): TSH, T4TOTAL, FREET4, T3FREE, THYROIDAB in the last 72 hours. Anemia Panel: No results for input(s): VITAMINB12, FOLATE, FERRITIN, TIBC, IRON, RETICCTPCT in the last 72 hours. Urine analysis:    Component Value Date/Time   COLORURINE RED (A) 06/10/2016 2107   APPEARANCEUR TURBID (A) 06/10/2016 2107   LABSPEC 1.027 06/10/2016 2107   PHURINE 8.0 06/10/2016 2107   GLUCOSEU NEGATIVE 06/10/2016 2107   HGBUR LARGE (A) 06/10/2016 2107   BILIRUBINUR NEGATIVE 06/10/2016 2107   KETONESUR 15 (A) 06/10/2016 2107   PROTEINUR >300 (A) 06/10/2016 2107   NITRITE NEGATIVE 06/10/2016 2107   LEUKOCYTESUR LARGE (A) 06/10/2016 2107   Sepsis Labs: @LABRCNTIP (procalcitonin:4,lacticidven:4) )No results found for this or any previous visit (from the past 240 hour(s)).    UA  evidence of UTI    No results found for: HGBA1C  Estimated Creatinine Clearance: 12 mL/min (by C-G formula based on SCr of 3.13 mg/dL (H)).  BNP (last 3 results) No results for input(s): PROBNP in the last 8760  hours.   ECG REPORT  Independently reviewed Rate:100  Rhythm: Sinus tachycardia ST&T Change: No acute ischemic changes poor baseline QTC 462  Filed Weights   06/10/16 2114  Weight: 49.9 kg (110 lb)     Cultures:    Component Value Date/Time   SDES KIDNEY LEFT NEPHROSTOMY 05/09/2016 1032   SPECREQUEST NONE 05/09/2016 1032   CULT (A) 05/09/2016 1032    40,000 COLONIES/mL ESCHERICHIA COLI Confirmed Extended Spectrum Beta-Lactamase Producer (ESBL) SENSITIVITIES REPEATED FOR CONFIRMATION >=100,000 COLONIES/mL YEAST Performed at [facility] 05/13/2016 FINAL 05/09/2016 1032     Radiological Exams on Admission: Dg Chest 2 View  Result Date: 06/10/2016 CLINICAL DATA:  Altered mental status EXAM: CHEST  2 VIEW COMPARISON:  Chest radiograph and chest CT May 07, 2016 FINDINGS: There is a probable nipple shadow at the right base. Lungs elsewhere are clear. Heart size and pulmonary vascularity are normal. No adenopathy. Temporary pacemaker wires are attached to the right heart. There is a prosthetic aortic valve. No adenopathy. No bone lesions. There is aortic atherosclerosis. IMPRESSION: No edema or consolidation. Probable nipple shadow right base. Repeat study with nipple markers advised to confirm. Heart size within normal limits. Status post aortic valve replacement. Pacemaker wires are attached to the right heart. There is aortic atherosclerosis. Electronically Signed   By: Lowella Grip III M.D.   On: 06/10/2016 21:31   Ct Head Wo Contrast  Result Date: 06/10/2016 CLINICAL DATA:  Altered mental status EXAM: CT HEAD WITHOUT CONTRAST TECHNIQUE: Contiguous axial images were obtained from the base of the skull through the vertex without  intravenous contrast. COMPARISON:  None. FINDINGS: Brain: There is mild diffuse atrophy. There is no intracranial mass, hemorrhage, extra-axial fluid collection, or midline shift. There is mild patchy small vessel disease in  the centra semiovale bilaterally. Elsewhere gray-white compartments appear normal. No acute infarct evident. Vascular: There is no hyperdense vessel. There is calcification in each carotid siphon region. Skull: The bony calvarium appears intact. Sinuses/Orbits: Visualized paranasal sinuses are clear. Visualized orbits appear symmetric bilaterally. Other: Mastoid air cells are clear. IMPRESSION: Atrophy with patchy periventricular small vessel disease. No intracranial mass, hemorrhage, or evidence of acute infarct. Areas of arterial vascular calcification noted. Electronically Signed   By: Lowella Grip III M.D.   On: 06/10/2016 21:45    Chart has been reviewed    Assessment/Plan  82 y.o. male with medical history significant of prostate cancer, CKD, bilateral hydronephrosis status post nephrostomy tubes, malnutrition, anemia, leukocytosis, colostomy history of urostomy, CAD, valve disease with bioprosthetic valve replacement being admitted for sepsis secondary to UTI secondary to  uro -colonic fistula formation  Present on Admission: . Sepsis ([place]) discussed overall poor prognosis with family who at this point interested in, as needed antibiotics and  IV fluids but avoid aggressive interventions patient is to be DO NOT RESUSCITATE palliative care consult . Acute on chronic renal failure (HCC) we will fluid resuscitate patient has poor by mouth intake secondary to progressive decline   . CAD, multiple vessel stable continue home medications . Hyperkalemia this is chronic Will fluid resuscitate avoid over aggressive interventions . Leukocytosis in a setting of chronic infection patient is currently comfort care . Prostate cancer ([place]) resulting in multiple surgeries bilateral hydronephrosis with bilateral nephrostomy tube placement and now fistula formation resulting in chronic infectious process . Protein-calorie malnutrition, severe - continue ensure . UTI (urinary tract infection) continue   imipenem examination will have ongoing but continue area but currently given worsening white blood cell, lactic acid will treat . Dehydration Administer IV fluid   Other plan as per orders.  DVT prophylaxis:    SCD     Code Status:   DNR/DNI  as per  family with concentration on comfort care  Family Communication:   Family not  at  Bedside  plan of care was discussed with   Daughter and Wife over the phone   Disposition Plan:                              Back to current facility when stable                                                    Social Work     Palliative care   consulted                          Consults called: none    Admission status:  Inpatient    Level of care   medical floor          I have spent a total of 56 min on this admission    Logan [place] 06/10/2016, 11:40 PM    [facility]  Pager [phone]   after 2 AM please page floor coverage PA If 7AM-7PM, please contact the day team taking care of the patient  [email or web address]  Password [email or web address]

## 2016-06-10 NOTE — ED Notes (Signed)
Bed: WA02 Expected date:  Expected time:  Means of arrival:  Comments: [place]

## 2016-06-10 NOTE — ED Notes (Signed)
Nurse will draw labs with IV start. 

## 2016-06-10 NOTE — ED Notes (Signed)
Dr. Oleta Mouse notified of pt's elevated lactic acid level. No orders received at this time

## 2016-06-10 NOTE — ED Notes (Signed)
Pts daughter [name] [phone] Pts wife [name] [phone]

## 2016-06-10 NOTE — ED Triage Notes (Signed)
Per EMS pt from [facility] facility. Staff sent pt in due to decreasing mental status this last week. State pt is less verbal, not wanting to eat and not producing as much urine. Facility wanted pt to be sent here to evaluate for UTI. State pt is usaully alert & orientated x4. Today pt is only orientated to himself.   CBG of 411 with EMS, staff states they never check it to know normal level.   Facility states pt has new sacrum wound that has been building up past week.

## 2016-06-10 NOTE — ED Provider Notes (Signed)
[facility] DEPT Provider Note   CSN: [id number] Arrival date & time: 06/10/16  2040     History   Chief Complaint Chief Complaint  Patient presents with  . Altered Mental Status    HPI Joseph Carrillo is a 82 y.o. male.  HPI Level V caveat due to Altered mental status. 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 Presents from [facility] facility. Per EMS staff has noticed that patient has had turned mental status with disorientation. Patient is less verbal and more agitated. They've noted decreased appetite and diminished urine. They're concerned about potential UTI. They have also noted that patient has not been moving around lately, and developed a pressure ulcer. I he normally is alert and oriented 4. Patient today states that he does not know why he is here. He has no complaints at this time.   Past Medical History:  Diagnosis Date  . Blood in stool   . Blood transfusion without reported diagnosis   . CAD (coronary artery disease)    a. 2000 - Says he had CABG x 3 or 4.  . Chicken pox   . Colon polyp   . Elevated blood pressure reading   . Glaucoma   . Heart murmur   . Hx: UTI (urinary tract infection)   . Hyperlipidemia   . Prostate cancer ([place])   . Valvular heart disease    a. 2005 s/p Valve replacement - presumably bioprosthetic (not on anticoagulation).  He doesn't know which valve.    Patient Active Problem List   Diagnosis Date Noted  . CAD, multiple vessel 05/16/2016  . Hx of CABG 05/16/2016  . SVT (supraventricular tachycardia) ([place]) 05/16/2016  . Valvular heart disease 05/16/2016  . Nephrostomy status ([place]) 05/16/2016  . Acute on chronic renal failure ([place]) 05/08/2016  . Acute renal failure (ARF) ([place]) 05/08/2016  . Hydronephrosis, bilateral    . Protein-calorie malnutrition, severe 03/30/2016  . Renal failure (ARF), acute on chronic (HCC)   . Hyperkalemia 03/28/2016  . CKD (chronic kidney disease) 03/28/2016  . Suprapubic catheter ([place]) 03/28/2016  . UTI (urinary tract infection) 03/28/2016  . Leukocytosis 03/28/2016  . Anemia 03/28/2016  . History of urostomy 03/22/2016  . Colostomy in place [facility]) 03/22/2016  . Underweight 03/22/2016  . Prostate cancer ([place]) 03/22/2016  . Abdominal pain, chronic, epigastric 03/22/2016  . Physical debility 03/22/2016    Past Surgical History:  Procedure Laterality Date  . CORONARY ARTERY BYPASS GRAFT    . IR GENERIC HISTORICAL  04/02/2016   IR NEPHROSTOMY PLACEMENT LEFT 04/02/2016 Sandi Mariscal, MD [facility]  . IR GENERIC HISTORICAL  04/02/2016   IR NEPHROSTOMY PLACEMENT RIGHT 04/02/2016 Sandi Mariscal, MD [facility]  . IR GENERIC HISTORICAL  05/15/2016   IR NEPHROSTOMY EXCHANGE LEFT 05/15/2016 Aletta Edouard, MD [facility]INTER[facility]  . IR GENERIC HISTORICAL  05/15/2016   IR NEPHROSTOMY EXCHANGE RIGHT 05/15/2016 Aletta Edouard, MD [facility]INTER[facility]  . LUNG SURGERY    . PROSTATE SURGERY         Home Medications    Prior to Admission medications   Medication Sig Start Date End Date Taking? Authorizing Provider  acetaminophen (TYLENOL) 325 MG tablet Take 2 tablets (650 mg total) by mouth every 6 (six) hours as needed for mild pain (or Fever >/= 101). 05/15/16  Yes Florencia Reasons, MD  albuterol (PROVENTIL) (2.5  MG/3ML) 0.083% nebulizer solution Take 3 mLs (2.5 mg total) by nebulization every 6 (six) hours as needed for wheezing or shortness of breath. 04/05/16  Yes Reyne Dumas, MD  Amino Acids-Protein Hydrolys (FEEDING SUPPLEMENT, PRO-STAT SUGAR FREE 64,) LIQD Take 30 mLs by mouth 2 (two) times daily.   Yes Historical Provider, MD  aspirin EC 81 MG tablet Take 81 mg by mouth daily at 12 noon.   Yes Historical Provider, MD  baclofen (LIORESAL) 5 mg TABS tablet Take 5 mg by mouth every 8 (eight) hours as  needed for muscle spasms.   Yes Historical Provider, MD  busPIRone (BUSPAR) 15 MG tablet Take 15 mg by mouth every 12 (twelve) hours as needed (agitation).    Yes Historical Provider, MD  cephALEXin (KEFLEX) 500 MG capsule Take 1 capsule (500 mg total) by mouth daily. 05/15/16  Yes Florencia Reasons, MD  docusate sodium (COLACE) 100 MG capsule Take 100 mg by mouth 2 (two) times daily.   Yes Historical Provider, MD  fluconazole (DIFLUCAN) 100 MG tablet Take 1 tablet (100 mg total) by mouth daily. 05/16/16  Yes Florencia Reasons, MD  HYDROcodone-acetaminophen (NORCO/VICODIN) 5-325 MG tablet Take 1 tablet by mouth every 6 (six) hours as needed. 05/17/16  Yes Lauree Chandler, NP  metoprolol tartrate (LOPRESSOR) 25 MG tablet Take 0.5 tablets (12.5 mg total) by mouth 2 (two) times daily. 04/05/16  Yes Reyne Dumas, MD  nitroGLYCERIN (NITROSTAT) 0.4 MG SL tablet Place 0.4 mg under the tongue every 5 (five) minutes as needed for chest pain.   Yes Historical Provider, MD  Nutritional Supplements (FEEDING SUPPLEMENT, NEPRO CARB STEADY,) LIQD Take 237 mLs by mouth daily. 05/16/16  Yes Florencia Reasons, MD  ranitidine (ZANTAC) 150 MG tablet Take 150 mg by mouth 2 (two) times daily.   Yes Historical Provider, MD  sodium bicarbonate 650 MG tablet Take 650 mg by mouth 4 (four) times daily.   Yes Historical Provider, MD  UNABLE TO FIND Take 120 mLs by mouth 2 (two) times daily. House 2.0 for caloric and weight support Med Pass   Yes Historical Provider, MD    Family History Family History  Problem Relation Age of Onset  . Heart disease Father     Pt says his brother had heart infection and died suddenly.    Social History Social History  Substance Use Topics  . Smoking status: Former Smoker    Quit date: 07/24/1971  . Smokeless tobacco: Never Used  . Alcohol use No     Comment: prev drank.  quit 1973 or 1974.     Allergies   Patient has no known allergies.   Review of Systems Review of Systems Unable to be obtained due to  mental status changes  Physical Exam Updated Vital Signs BP 156/69 (BP Location: Left Arm)   Pulse 106   Temp 97.5 F (36.4 C) (Rectal)   Resp 17   Ht 5\' 11"  (1.803 m)   Wt 110 lb (49.9 kg)   SpO2 100%   BMI 15.34 kg/m   Physical Exam Physical Exam  Nursing note and vitals reviewed. Constitutional: Cachectic and malnourished appearing, non-toxic, and in no acute distress Head: Normocephalic and atraumatic.  Mouth/Throat: Oropharynx is clear and dry.  Neck: Normal range of motion. Neck supple.  Cardiovascular: Tachycardic rate and regular rhythm.   Pulmonary/Chest: Effort normal and breath sounds normal. No edema. Abdominal: Soft. There is no tenderness. No distention. Colostomy in the right lower quadrant draining loose stool. Suprapubic catheter  draining purulent appearing and concentrated urine. There is no rebound and no guarding.  bilateral nephrostomy tubes draining clear yellow urine.  Musculoskeletal: No deformities Neurological: Alert, no facial droop, fluent speech, moves all extremities symmetrically Skin: Skin is warm and dry.  large decubitus ulcer extending into the soft tissue. No active drainage. Psychiatric: Cooperative   ED Treatments / Results  Labs (all labs ordered are listed, but only abnormal results are displayed) Labs Reviewed  CBC WITH DIFFERENTIAL/PLATELET - Abnormal; Notable for the following:       Result Value   WBC 32.0 (*)    RBC 3.55 (*)    Hemoglobin 10.6 (*)    HCT 32.5 (*)    Neutro Abs 30.7 (*)    All other components within normal limits  COMPREHENSIVE METABOLIC PANEL - Abnormal; Notable for the following:    Potassium 5.5 (*)    CO2 20 (*)    Glucose, Bld 302 (*)    BUN 145 (*)    Creatinine, Ser 3.13 (*)    Albumin 2.4 (*)    Alkaline Phosphatase 218 (*)    GFR calc non Af Amer 17 (*)    GFR calc Af Amer 19 (*)    Anion gap 16 (*)    All other components within normal limits  URINALYSIS, ROUTINE W REFLEX MICROSCOPIC (NOT AT  [facility]) - Abnormal; Notable for the following:    Color, Urine RED (*)    APPearance TURBID (*)    Hgb urine dipstick LARGE (*)    Ketones, ur 15 (*)    Protein, ur >300 (*)    Leukocytes, UA LARGE (*)    All other components within normal limits  URINE MICROSCOPIC-ADD ON - Abnormal; Notable for the following:    Bacteria, UA MANY (*)    All other components within normal limits  I-STAT CG4 LACTIC ACID, ED - Abnormal; Notable for the following:    Lactic Acid, Venous 4.01 (*)    All other components within normal limits  URINE CULTURE  CULTURE, BLOOD (ROUTINE X 2)  CULTURE, BLOOD (ROUTINE X 2)  URINE CULTURE  URINE CULTURE  URINALYSIS, ROUTINE W REFLEX MICROSCOPIC (NOT AT [facility])    EKG  EKG Interpretation  Date/Time:  Sunday June 10 2016 22:04:07 EST Ventricular Rate:  100 PR Interval:    QRS Duration: 89 QT Interval:  358 QTC Calculation: 462 R Axis:   110 Text Interpretation:  Atrial fibrillation with rapid V-rate Paired ventricular premature complexes Right axis deviation Low voltage, extremity leads Baseline wander in lead(s) V3 TECHNICALLY DIFFICULT wandering baseline Sinus tachycardia Confirmed by Tabatha Razzano MD, Yahaira Bruski [phone]) on 06/10/2016 11:00:25 PM       Radiology Dg Chest 2 View  Result Date: 06/10/2016 CLINICAL DATA:  Altered mental status EXAM: CHEST  2 VIEW COMPARISON:  Chest radiograph and chest CT May 07, 2016 FINDINGS: There is a probable nipple shadow at the right base. Lungs elsewhere are clear. Heart size and pulmonary vascularity are normal. No adenopathy. Temporary pacemaker wires are attached to the right heart. There is a prosthetic aortic valve. No adenopathy. No bone lesions. There is aortic atherosclerosis. IMPRESSION: No edema or consolidation. Probable nipple shadow right base. Repeat study with nipple markers advised to confirm. Heart size within normal limits. Status post aortic valve replacement. Pacemaker wires are attached to the right heart.  There is aortic atherosclerosis. Electronically Signed   By: Lowella Grip III M.D.   On: 06/10/2016 21:31   Ct Head  Wo Contrast  Result Date: 06/10/2016 CLINICAL DATA:  Altered mental status EXAM: CT HEAD WITHOUT CONTRAST TECHNIQUE: Contiguous axial images were obtained from the base of the skull through the vertex without intravenous contrast. COMPARISON:  None. FINDINGS: Brain: There is mild diffuse atrophy. There is no intracranial mass, hemorrhage, extra-axial fluid collection, or midline shift. There is mild patchy small vessel disease in the centra semiovale bilaterally. Elsewhere gray-white compartments appear normal. No acute infarct evident. Vascular: There is no hyperdense vessel. There is calcification in each carotid siphon region. Skull: The bony calvarium appears intact. Sinuses/Orbits: Visualized paranasal sinuses are clear. Visualized orbits appear symmetric bilaterally. Other: Mastoid air cells are clear. IMPRESSION: Atrophy with patchy periventricular small vessel disease. No intracranial mass, hemorrhage, or evidence of acute infarct. Areas of arterial vascular calcification noted. Electronically Signed   By: Lowella Grip III M.D.   On: 06/10/2016 21:45    Procedures Procedures (including critical care time) CRITICAL CARE Performed by: Forde Dandy   Total critical care time: 35 minutes  Critical care time was exclusive of separately billable procedures and treating other patients.  Critical care was necessary to treat or prevent imminent or life-threatening deterioration.  Critical care was time spent personally by me on the following activities: development of treatment plan with patient and/or surrogate as well as nursing, discussions with consultants, evaluation of patient's response to treatment, examination of patient, obtaining history from patient or surrogate, ordering and performing treatments and interventions, ordering and review of laboratory studies,  ordering and review of radiographic studies, pulse oximetry and re-evaluation of patient's condition.  Medications Ordered in ED Medications  sodium chloride 0.9 % bolus 1,000 mL (0 mLs Intravenous Stopped 06/10/16 2229)  sodium chloride 0.9 % bolus 500 mL (500 mLs Intravenous New Bag/Given 06/10/16 2229)  imipenem-cilastatin (PRIMAXIN) 500 mg in sodium chloride 0.9 % 100 mL IVPB (0 mg Intravenous Stopped 06/10/16 2259)     Initial Impression / Assessment and Plan / ED Course  I have reviewed the triage vital signs and the nursing notes.  Pertinent labs & imaging results that were available during my care of the patient were reviewed by me and considered in my medical decision making (see chart for details).  Clinical Course     Old records are reviewed. The patient was admitted into the hospital one month ago sepsis secondary to urinary source. Felt to be due to his enterovesicular fistula, but not a surgical candidate and this is likely a recurrent issue for him.  With evidence of sepsis here today. Has leukocytosis of 32, lactic acid of 4. Is mildly tachycardic but afebrile and normotensive. His acute kidney injury with elevated creatinine above 3.  Given IV fluids per sepsis protocol. Source of infection again here today is likely urinary source. CXR visualized and clear. CT head visualized and show no acute intracranial processes. Dd obtain blood cultures, urine and urine cultures from his bilateral nephrostomy tubes and suprapubic catheter. Covered with imipenem. On chart review, last cultures grew positive for Escherichia coli only sensitive to imipenem.  Discussed with Dr. Roel Cluck will admit for ongoing management.  Final Clinical Impressions(s) / ED Diagnoses   Final diagnoses:  Leukocytosis, unspecified type  Sepsis, due to unspecified organism [facility])  Urinary tract infection associated with nephrostomy catheter, initial encounter ([place])  Elevated lactic acid level    New  Prescriptions New Prescriptions   No medications on file     Forde Dandy, MD 06/10/16 2317

## 2016-06-10 NOTE — ED Notes (Signed)
Informed Dr. Oleta Mouse of lactic acid of 4.01 @ 2155 by QA

## 2016-06-11 DIAGNOSIS — T83512A Infection and inflammatory reaction due to nephrostomy catheter, initial encounter: Principal | ICD-10-CM

## 2016-06-11 DIAGNOSIS — R7989 Other specified abnormal findings of blood chemistry: Secondary | ICD-10-CM

## 2016-06-11 LAB — GLUCOSE, CAPILLARY
GLUCOSE-CAPILLARY: 221 mg/dL — AB (ref 65–99)
GLUCOSE-CAPILLARY: 188 mg/dL — AB (ref 65–99)
GLUCOSE-CAPILLARY: 76 mg/dL (ref 65–99)
GLUCOSE-CAPILLARY: 144 mg/dL — AB (ref 65–99)
Glucose-Capillary: 225 mg/dL — ABNORMAL HIGH (ref 65–99)
Glucose-Capillary: 162 mg/dL — ABNORMAL HIGH (ref 65–99)

## 2016-06-11 LAB — MRSA PCR SCREENING: MRSA by PCR: NEGATIVE

## 2016-06-11 MED ORDER — SODIUM CHLORIDE 0.9 % IV SOLN
INTRAVENOUS | Status: AC
  Administered 2016-06-11: 01:00:00 via INTRAVENOUS

## 2016-06-11 MED ORDER — ASPIRIN EC 81 MG PO TBEC
81.0000 mg | DELAYED_RELEASE_TABLET | Freq: Every day | ORAL | Status: DC
Start: 2016-06-11 — End: 2016-06-13
  Administered 2016-06-11 – 2016-06-12 (×2): 81 mg via ORAL
  Filled 2016-06-11 (×2): qty 1

## 2016-06-11 MED ORDER — ONDANSETRON HCL 4 MG/2ML IJ SOLN: 4.0000 mg | Freq: Four times a day (QID) | INTRAMUSCULAR | Status: DC | PRN

## 2016-06-11 MED ORDER — ACETAMINOPHEN 325 MG PO TABS
650.0000 mg | ORAL_TABLET | Freq: Four times a day (QID) | ORAL | Status: DC | PRN
  Administered 2016-06-12: 650 mg via ORAL
  Filled 2016-06-11: qty 2

## 2016-06-11 MED ORDER — SODIUM BICARBONATE 650 MG PO TABS
650.0000 mg | ORAL_TABLET | Freq: Four times a day (QID) | ORAL | Status: DC
  Administered 2016-06-11 – 2016-06-12 (×6): 650 mg via ORAL
  Filled 2016-06-11 (×6): qty 1

## 2016-06-11 MED ORDER — CHLORHEXIDINE GLUCONATE 0.12 % MT SOLN
15.0000 mL | Freq: Two times a day (BID) | OROMUCOSAL | Status: DC
  Administered 2016-06-11 – 2016-06-12 (×2): 15 mL via OROMUCOSAL
  Filled 2016-06-11 (×2): qty 15

## 2016-06-11 MED ORDER — NEPRO/CARBSTEADY PO LIQD
237.0000 mL | ORAL | Status: DC
  Filled 2016-06-11: qty 237

## 2016-06-11 MED ORDER — NEPRO/CARBSTEADY PO LIQD
237.0000 mL | ORAL | Status: DC | PRN
  Filled 2016-06-11: qty 237

## 2016-06-11 MED ORDER — METOPROLOL TARTRATE 12.5 MG HALF TABLET
12.5000 mg | ORAL_TABLET | Freq: Two times a day (BID) | ORAL | Status: DC
  Administered 2016-06-11 – 2016-06-12 (×4): 12.5 mg via ORAL
  Filled 2016-06-11 (×4): qty 1

## 2016-06-11 MED ORDER — INSULIN ASPART 100 UNIT/ML ~~LOC~~ SOLN
0.0000 [IU] | SUBCUTANEOUS | Status: DC
  Administered 2016-06-11: 2 [IU] via SUBCUTANEOUS
  Administered 2016-06-11 (×2): 3 [IU] via SUBCUTANEOUS
  Administered 2016-06-11: 1 [IU] via SUBCUTANEOUS
  Administered 2016-06-11: 2 [IU] via SUBCUTANEOUS
  Administered 2016-06-12 (×3): 1 [IU] via SUBCUTANEOUS

## 2016-06-11 MED ORDER — ORAL CARE MOUTH RINSE
15.0000 mL | Freq: Two times a day (BID) | OROMUCOSAL | Status: DC
  Administered 2016-06-11 – 2016-06-12 (×3): 15 mL via OROMUCOSAL

## 2016-06-11 MED ORDER — HYDROCODONE-ACETAMINOPHEN 5-325 MG PO TABS
1.0000 | ORAL_TABLET | Freq: Four times a day (QID) | ORAL | Status: DC | PRN
  Administered 2016-06-12: 1 via ORAL
  Filled 2016-06-11: qty 1

## 2016-06-11 MED ORDER — BACLOFEN 10 MG PO TABS: 5.0000 mg | ORAL_TABLET | Freq: Three times a day (TID) | ORAL | Status: DC | PRN

## 2016-06-11 MED ORDER — FAMOTIDINE 20 MG PO TABS
10.0000 mg | ORAL_TABLET | Freq: Every day | ORAL | Status: DC
  Administered 2016-06-11 – 2016-06-12 (×2): 10 mg via ORAL
  Filled 2016-06-11 (×2): qty 1

## 2016-06-11 MED ORDER — LORAZEPAM 2 MG/ML IJ SOLN: 0.5000 mg | Freq: Four times a day (QID) | INTRAMUSCULAR | Status: DC | PRN

## 2016-06-11 MED ORDER — PRO-STAT SUGAR FREE PO LIQD
30.0000 mL | Freq: Two times a day (BID) | ORAL | Status: DC
  Administered 2016-06-11: 30 mL via ORAL
  Filled 2016-06-11 (×2): qty 30

## 2016-06-11 MED ORDER — ACETAMINOPHEN 650 MG RE SUPP: 650.0000 mg | Freq: Four times a day (QID) | RECTAL | Status: DC | PRN

## 2016-06-11 MED ORDER — SODIUM CHLORIDE 0.9 % IV SOLN
INTRAVENOUS | Status: DC
  Administered 2016-06-11 – 2016-06-12 (×2): via INTRAVENOUS

## 2016-06-11 MED ORDER — BUSPIRONE HCL 5 MG PO TABS: 15.0000 mg | ORAL_TABLET | Freq: Two times a day (BID) | ORAL | Status: DC | PRN

## 2016-06-11 MED ORDER — SODIUM CHLORIDE 0.9 % IV SOLN
500.0000 mg | Freq: Two times a day (BID) | INTRAVENOUS | Status: DC
  Filled 2016-06-11: qty 500

## 2016-06-11 MED ORDER — ALBUTEROL SULFATE (2.5 MG/3ML) 0.083% IN NEBU
2.5000 mg | INHALATION_SOLUTION | RESPIRATORY_TRACT | Status: DC | PRN
Start: 2016-06-11 — End: 2016-06-13

## 2016-06-11 MED ORDER — COLLAGENASE 250 UNIT/GM EX OINT
TOPICAL_OINTMENT | Freq: Every day | CUTANEOUS | Status: DC
  Administered 2016-06-11 – 2016-06-12 (×2): via TOPICAL
  Filled 2016-06-11: qty 30

## 2016-06-11 MED ORDER — FLUCONAZOLE 100 MG PO TABS
100.0000 mg | ORAL_TABLET | Freq: Every day | ORAL | Status: DC
  Administered 2016-06-11 – 2016-06-12 (×2): 100 mg via ORAL
  Filled 2016-06-11 (×2): qty 1

## 2016-06-11 MED ORDER — MORPHINE SULFATE (PF) 2 MG/ML IV SOLN: 2.0000 mg | INTRAVENOUS | Status: DC | PRN

## 2016-06-11 MED ORDER — ALBUTEROL SULFATE (2.5 MG/3ML) 0.083% IN NEBU: 2.5000 mg | INHALATION_SOLUTION | Freq: Four times a day (QID) | RESPIRATORY_TRACT | Status: DC | PRN

## 2016-06-11 MED ORDER — ONDANSETRON HCL 4 MG PO TABS: 4.0000 mg | ORAL_TABLET | Freq: Four times a day (QID) | ORAL | Status: DC | PRN

## 2016-06-11 MED ORDER — SODIUM CHLORIDE 0.9 % IV SOLN
500.0000 mg | Freq: Two times a day (BID) | INTRAVENOUS | Status: DC
  Administered 2016-06-11 – 2016-06-12 (×3): 500 mg via INTRAVENOUS
  Filled 2016-06-11 (×4): qty 0.5

## 2016-06-11 NOTE — Progress Notes (Signed)
Nutrition Brief Note  82 y.o.malewith medical history significant ofprostate cancer, CKD, bilateral hydronephrosis status post nephrostomy tubes, malnutrition, anemia, leukocytosis, colostomy history of urostomy, CAD, valve disease with bioprosthetic valve replacement presented with decreasing mental status for the past week less verbal than usual. has not been eating well and urine output has decreased. Pt is DNR comfort care.  Chart reviewed and discussed with RN. Patient now transitioning to comfort care. CSW has been consulted for placement in residential hospice. Still pending SLP evaluation.  Patient known to this RD from previous admission. Patient with difficulty chewing so will likely need mechanical soft diet. However, recommend liberalizing Heart Healthy restriction as patient reports he does not enjoy the food offered.   RD was also consulted for wound healing as patient has now developed unstageable pressure ulcer to sacrum. Not appropriate to provide so many oral nutrition supplements to a patient who is comfort care. Will d/c Pro-Stat and change Nepro to PRN as patient reports he likes to drink Nepro occasionally.  No further nutrition interventions warranted at this time.  Please re-consult as needed.   Willey Blade, MS, RD, LDN Pager: [phone] After Hours Pager: [phone]

## 2016-06-11 NOTE — Consult Note (Signed)
Consultation Note Date: 06/11/2016   Patient Name: Mariana Arens  DOB: 04-01-1927  MRN: [id number]  Age / Sex: 82 y.o., male  PCP: Darreld Mclean, MD Referring Physician: Rosita Fire, MD  Reason for Consultation: Establishing goals of care  HPI/Patient Profile: 82 y.o. male     admitted on 06/10/2016   82 y.o.malewith medical history significant ofprostate cancer, CKD, bilateral hydronephrosis status post nephrostomy tubes, malnutrition, anemia, leukocytosis, colostomy history of urostomy, CAD, valve disease with bioprosthetic valve replacement presented with decreasing mental status for the past week less verbal than usual. has not been eating well and urine output has decreased. Pt is admitted to the hospitalist service for sepsis due to UTI, supra pubic catheter use, acute on chronic kidney disease, acute on chronic metabolic encephalopathy.    Clinical Assessment and Goals of Care:   Patient is known to palliative service, seen in initial consultation in past hospitalization in October 2017. At that time, DNR DNI, SNF rehab with palliative and then consideration for hospice was deemed appropriate. Patient has ongoing weakness from his chronic infections, not eating well, failure to thrive due to illness. Hence, patient was brought back to the hospital.   Palliative service has been re consulted for further goals of care discussions.   The patient appears much more weaker, he is frail. He is not able to chew his food, has diminished appetite. He is awake, not entirely alert, he is able to answer a few questions accurately. Daughter [name] is at the bedside.   Re introduced myself and palliative care as follows: Palliative medicine is specialized medical care for people living with serious illness. It focuses on providing relief from the symptoms and stress of a serious illness. The goal is to  improve quality of life for both the patient and the family.  Discussed extensively about the patient's acute on chronic conditions and infections and overall trajectory of decline even since the past 3-4 weeks. Appreciate and agree with Hospital Medicine MD's recommendations for comfort measures and Hospice. Daughter states she wants what is best for the patient.   Patient's wife [name] arrived at the hospital shortly there after. [name] has been living in [place], [place], under the care of her sister and brother in law, she has serious illness diagnosis herself. She also wishes to keep the patient as comfortable as possible.   Wife is asking if Hospice can help her with HCPOA paperwork. There are no known additional children. Patient's wife is next of kin, unknown if daughter [name] has [facility] paperwork, how ever, both wife and daughter are on the same page as far as decisions regarding comfort care and transfer to hospice, see recommendations below, thank you for the consult, discussed with CSW Elmyra Ricks.  NEXT OF KIN  wife daughter   SUMMARY OF RECOMMENDATIONS    DNR DNI Comfort measures CSW consult for residential hospice Wife to be informed first of any medical updates or decisions, how ever, daughter and wife make decisions jointly  Code Status/Advance Care Planning:  DNR    Symptom Management:     Continue current measures.  Palliative Prophylaxis:   Bowel Regimen  Psycho-social/Spiritual:   Desire for further Chaplaincy support:yes  Additional Recommendations: Education on Hospice  Prognosis:   < 2 weeks  Discharge Planning: Hospice facility      Primary Diagnoses: Present on Admission: . Acute on chronic renal failure ([place]) . CAD, multiple vessel . Hyperkalemia . Leukocytosis . Prostate cancer ([place]) . Protein-calorie malnutrition, severe . Urinary tract infection associated with nephrostomy catheter ([place]) . Dehydration . Sepsis ([place]) . Sacral decubitus  ulcer   I have reviewed the medical record, interviewed the patient and family, and examined the patient. The following aspects are pertinent.  Past Medical History:  Diagnosis Date  . Blood in stool   . Blood transfusion without reported diagnosis   . CAD (coronary artery disease)    a. 2000 - Says he had CABG x 3 or 4.  . Chicken pox   . Colon polyp   . Elevated blood pressure reading   . Glaucoma   . Heart murmur   . Hx: UTI (urinary tract infection)   . Hyperlipidemia   . Prostate cancer ([place])   . Valvular heart disease    a. 2005 s/p Valve replacement - presumably bioprosthetic (not on anticoagulation).  He doesn't know which valve.   Social History   Social History  . Marital status: Married    Spouse name: N/A  . Number of children: N/A  . Years of education: N/A   Social History Main Topics  . Smoking status: Former Smoker    Quit date: 07/24/1971  . Smokeless tobacco: Never Used  . Alcohol use No     Comment: prev drank.  quit 1973 or 1974.  . Drug use: No  . Sexual activity: Not Asked   Other Topics Concern  . None   Social History Narrative   Lives in [place], [place] area by himself but is currently staying with his sister in [place] area after prolonged hospitalization this summer.   Family History  Problem Relation Age of Onset  . Heart disease Father     Pt says his brother had heart infection and died suddenly.   Scheduled Meds: . aspirin EC  81 mg Oral Q1200  . collagenase   Topical Daily  . famotidine  10 mg Oral Daily  . feeding supplement (NEPRO CARB STEADY)  237 mL Oral Q24H  . feeding supplement (PRO-STAT SUGAR FREE 64)  30 mL Oral BID  . fluconazole  100 mg Oral Daily  . insulin aspart  0-9 Units Subcutaneous Q4H  . meropenem (MERREM) IV  500 mg Intravenous Q12H  . metoprolol tartrate  12.5 mg Oral BID  . sodium bicarbonate  650 mg Oral QID   Continuous Infusions: . sodium chloride 100 mL/hr at 06/11/16 1332   PRN Meds:.acetaminophen  **OR** acetaminophen, albuterol, baclofen, busPIRone, HYDROcodone-acetaminophen, LORazepam, morphine injection, ondansetron **OR** ondansetron (ZOFRAN) IV Medications Prior to Admission:  Prior to Admission medications   Medication Sig Start Date End Date Taking? Authorizing Provider  acetaminophen (TYLENOL) 325 MG tablet Take 2 tablets (650 mg total) by mouth every 6 (six) hours as needed for mild pain (or Fever >/= 101). 05/15/16  Yes Florencia Reasons, MD  albuterol (PROVENTIL) (2.5 MG/3ML) 0.083% nebulizer solution Take 3 mLs (2.5 mg total) by nebulization every 6 (six) hours as needed for wheezing or shortness of breath. 04/05/16  Yes Reyne Dumas, MD  Amino Acids-Protein Hydrolys (FEEDING  SUPPLEMENT, PRO-STAT SUGAR FREE 64,) LIQD Take 30 mLs by mouth 2 (two) times daily.   Yes Historical Provider, MD  aspirin EC 81 MG tablet Take 81 mg by mouth daily at 12 noon.   Yes Historical Provider, MD  baclofen (LIORESAL) 5 mg TABS tablet Take 5 mg by mouth every 8 (eight) hours as needed for muscle spasms.   Yes Historical Provider, MD  busPIRone (BUSPAR) 15 MG tablet Take 15 mg by mouth every 12 (twelve) hours as needed (agitation).    Yes Historical Provider, MD  cephALEXin (KEFLEX) 500 MG capsule Take 1 capsule (500 mg total) by mouth daily. 05/15/16  Yes Florencia Reasons, MD  docusate sodium (COLACE) 100 MG capsule Take 100 mg by mouth 2 (two) times daily.   Yes Historical Provider, MD  fluconazole (DIFLUCAN) 100 MG tablet Take 1 tablet (100 mg total) by mouth daily. 05/16/16  Yes Florencia Reasons, MD  HYDROcodone-acetaminophen (NORCO/VICODIN) 5-325 MG tablet Take 1 tablet by mouth every 6 (six) hours as needed. 05/17/16  Yes Lauree Chandler, NP  metoprolol tartrate (LOPRESSOR) 25 MG tablet Take 0.5 tablets (12.5 mg total) by mouth 2 (two) times daily. 04/05/16  Yes Reyne Dumas, MD  nitroGLYCERIN (NITROSTAT) 0.4 MG SL tablet Place 0.4 mg under the tongue every 5 (five) minutes as needed for chest pain.   Yes Historical  Provider, MD  Nutritional Supplements (FEEDING SUPPLEMENT, NEPRO CARB STEADY,) LIQD Take 237 mLs by mouth daily. 05/16/16  Yes Florencia Reasons, MD  ranitidine (ZANTAC) 150 MG tablet Take 150 mg by mouth 2 (two) times daily.   Yes Historical Provider, MD  sodium bicarbonate 650 MG tablet Take 650 mg by mouth 4 (four) times daily.   Yes Historical Provider, MD  UNABLE TO FIND Take 120 mLs by mouth 2 (two) times daily. House 2.0 for caloric and weight support Med Pass   Yes Historical Provider, MD   No Known Allergies Review of Systems + for generalized pain, mild + for lack of eating  Physical Exam Frail elderly male appears ill S1 S2 Shallow clear breath sounds Abdomen soft, mildly distended, has colostomy, supra pubic catheter, bilateral nephrostomy tubes Awake but confused Answers few questions appropriately  Vital Signs: BP 116/64 (BP Location: Right Arm)   Pulse 78   Temp 97.7 F (36.5 C) (Oral)   Resp 18   Ht 5\' 11"  (1.803 m)   Wt 49.9 kg (110 lb)   SpO2 100%   BMI 15.34 kg/m  Pain Assessment: Faces       SpO2: SpO2: 100 % O2 Device:SpO2: 100 % O2 Flow Rate: .   IO: Intake/output summary:  Intake/Output Summary (Last 24 hours) at 06/11/16 1419 Last data filed at 06/11/16 1156  Gross per 24 hour  Intake             1141 ml  Output              350 ml  Net              791 ml    LBM: Last BM Date: 06/11/16 Baseline Weight: Weight: 49.9 kg (110 lb) Most recent weight: Weight: 49.9 kg (110 lb)     Palliative Assessment/Data:   Flowsheet Rows   Flowsheet Row Most Recent Value  Intake Tab  Referral Department  Hospitalist  Unit at Time of Referral  Med/Surg Unit  Palliative Care Primary Diagnosis  Cancer  Date Notified  06/11/16  Palliative Care Type  Return patient Palliative  Care  Reason for referral  Clarify Goals of Care, Counsel Regarding Hospice, End of [place]  Date of Admission  06/10/16  Date first seen by Palliative Care  06/11/16  # of  days IP prior to Palliative referral  1  Clinical Assessment  Palliative Performance Scale Score  30%  Pain Max last 24 hours  5  Pain Min Last 24 hours  4  Dyspnea Max Last 24 Hours  5  Dyspnea Min Last 24 hours  4  Nausea Max Last 24 Hours  5  Nausea Min Last 24 Hours  4  Psychosocial & Spiritual Assessment  Palliative Care Outcomes  Patient/Family meeting held?  Yes  Who was at the meeting?  patient wife daughter brother in law, sister in Sports coach.   Palliative Care Outcomes  Clarified goals of care  Palliative Care follow-up planned  Yes, Facility      Time In:  1300 Time Out:  1410 Time Total:  70 min  Greater than 50%  of this time was spent counseling and coordinating care related to the above assessment and plan.  Signed by: Loistine Chance, MD  [phone]  Please contact Palliative Medicine Team phone at [phone] for questions and concerns.  For individual provider: See Shea Evans

## 2016-06-11 NOTE — Progress Notes (Signed)
eLink Physician-Brief Progress Note Patient Name: Joseph Carrillo DOB: Aug 28, 1926 MRN: [id number]   Date of Service  06/11/2016  HPI/Events of Note  Needs repeat lactic acid ordered to complete orderset for sepsis monitoring  eICU Interventions  ordered     Intervention Category Minor Interventions: Routine modifications to care plan (e.g. PRN medications for pain, fever)  Simonne Maffucci 06/11/2016, 2:49 AM

## 2016-06-11 NOTE — Progress Notes (Signed)
Pt arrived to room 1510 from ED. Pt is alert x1. Mostly shakes head yes or no to answer simple questions. No family present. Pt has right and left AC SL in place. Pt has suprapubic catheter draining tan, milky urine. Right and left nephrostomy tubes in place draining yellow urine. Colostomy in place, stoma pink and moist with dark green, liquid stool. Pt has stage 2 wound to sacral area, wound bed red with scant serosang drainage and stage 1 to left hip, skin remains intact. Site cleaned and allevyn foam applied. Bilateral heels are dry, cracked, allevyn heel foam applied and heels floated on pillow. Pt oriented to room, call bell and initial plan of care, but difficult to determine his level of understanding. Will continue to monitor.

## 2016-06-11 NOTE — Progress Notes (Signed)
Pharmacy Antibiotic Note  Joseph Carrillo is a 82 y.o. male admitted on 06/10/2016 with UTI and r/o sepsis.  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  Lactate > 4, code sepsis called in the ED on 11/19.  Patient received Primaxin 500mg  IV x 1 dose @ 22:29 on 11/19.  Pharmacy was consulted for Primaxin dosing.   However, new prescribing information for Primaxin recommends against use when CrCl < 15 mL/min and HD not pending, due to risk for seizures.  [place] P/T Committee recently authorized use of meropenem (Merrem) as formulary equivalent to Primaxin.  Plan:  Meropenem 500 mg IV q12h (adjusted for renal function)  Follow renal function, culture results, clinical course   Height: 5\' 11"  (180.3 cm) Weight: 110 lb (49.9 kg) IBW/kg (Calculated) : 75.3  Temp (24hrs), Avg:97.6 F (36.4 C), Min:97.5 F (36.4 C), Max:97.7 F (36.5 C)   Recent Labs Lab 06/10/16 2133 06/10/16 2150  WBC 32.0*  --   CREATININE 3.13*  --   LATICACIDVEN  --  4.01*    Estimated Creatinine Clearance: 12 mL/min (by C-G formula based on SCr of 3.13 mg/dL (H)).    No Known Allergies  Antimicrobials this admission: 11/19 Primaxin >>11/19 11/20 Meropenem >>   Dose adjustments this admission:    Microbiology results: 11/19 BCx x 2: no growth to date 11/19 UCx, suprapubic: collected 11/19 UCx from R nephrostomy tube: collected 11/20 MRSA PCR screen: negative  Thank you for allowing pharmacy to be a part of this patient's care.  Clayburn Pert, PharmD, BCPS Pager: [phone] 06/11/2016  12:51 PM

## 2016-06-11 NOTE — Consult Note (Addendum)
[facility] Nurse wound consult note Reason for Consult: Unstageable Pressure Injury to Sacrum Wound type:pressure Pressure Ulcer POA: Yes Measurement:7cm x 8cm with dark purple center measuring 3cm x 4cm Wound bed: Not open Drainage (amount, consistency, odor)  Periwound: Dressing procedure/placement/frequency:The presentation of this wound is consistent with a deep tissue pressure injury (DTPI) and likely to develop into a full thickness (Stage 3 or Stage 4) injury in spite of aggressive and focused interventions. We will immediately implement a therapeutic mattress with low air loss feature, turning and repositioning and topical wound care using an enzymatic debriding agent and a silicone foam topper.  We will consult an RD to ensure that appropriate nutritional interventions are provided.    [facility] Nurse ostomy follow up Stoma type/location: RLQ ileostomy Stomal assessment/size: 3/4 inches (approximate, patient would not allow sizing today due to confusion) Peristomal assessment: parastomal skin loss circumferentially measuring 1/4 inch, also small area of denuded tissue outside of 4 inch parastomal field at 9 o'clock measuring 1cm x 2cm x 0.1cm due to leakage.  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 Treatment options for stomal/peristomal skin: convex 1-piece pouch Output brown, thick stool Ostomy pouching: 1pc convex system, cut-to-fit. Kellie Simmering [phone] with skin barrier ring. Two piece system is leaking and pouch is angled toward left, allowing for no capacity and difficult drainage.  Education provided: None.  Patient is unable Enrolled patient in [facility] Start Discharge program: No  WOC nursing team will not follow, but will remain available to this patient, the nursing and medical teams.  Please re-consult if needed and particularly if the patient . Thanks, Maudie Flakes, MSN, RN, [place], Arther Abbott  Pager# [phone]

## 2016-06-11 NOTE — Progress Notes (Signed)
PROGRESS NOTE    Joseph Carrillo  [id number] DOB: Sep 19, 1926 DOA: 06/10/2016 PCP: [name], MD   Brief Narrative: 82 y.o. male with medical history significant of prostate cancer, CKD, bilateral hydronephrosis status post nephrostomy tubes, malnutrition, anemia, leukocytosis, colostomy history of urostomy, CAD, valve disease with bioprosthetic valve replacement presented with decreasing mental status for the past week less verbal than usual. has not been eating well and urine output has decreased. Pt is DNR comfort care.  Assessment & Plan:   # Sepsis due to urinary tract infection in the setting of suprapubic catheter: -Patient with poor prognosis. Discussed with the patient's daughter at length. Focused on comfort care. Social worker consulted for hospice evaluation. -No further lab or investigations since we are focused on comfort care. -Mean time continue IV meropenem. IV fluid and supportive care. -Follow-up culture results.  #Acute on chronic kidney disease likely due to dehydration, sepsis: On IV fluid. No further lab. Patient has bilateral nephrostomy tubes and Foley catheter.  #Acute on chronic encephalopathy likely metabolic: Likely in the setting of sepsis, kidney disease. CT scan of head with no mass or infarction.  #Hypokalemia: On IV fluid.  #Prostate cancer resulting in multiple surgeries including bilateral nephrostomy tube placement, has fistula and chronic infection. Overall poor prognosis.  #Severe protein calorie malnutrition: We will do speech and swallow evaluation. An IV fluid.  #Poor prognosis: Multiple comorbidities and declining in medical and physical condition. Discussed with the patient's daughter. Patient is DO NOT RESUSCITATE comfort care. Likely discharge to hospice care. Palliative care was consulted on admission.  Other problems:   Colostomy in place [facility])   Prostate cancer ([place])   Hyperkalemia   UTI (urinary tract infection)    Leukocytosis   Protein-calorie malnutrition, severe   Acute on chronic renal failure (HCC)   CAD, multiple vessel   Dehydration   Sepsis ([place])   Sacral decubitus ulcer  DVT prophylaxis: Comfort care. Patient on aspirin Code Status: DO NOT RESUSCITATE comfort Family Communication: Discussed with the patient's daughter Disposition Plan: Likely discharge to inpatient hospice.  Consultants:   Palliative care  Procedures: None Antimicrobials: IV meropenem  Subjective: Patient was seen and examined at bedside. Patient reported feeling weak. Denied nausea, vomiting, chest pain or shortness of breath. Unable to obtain detailed review of system because of encephalopathy.   Objective: Vitals:   06/10/16 2200 06/10/16 2330 06/11/16 0025 06/11/16 0357  BP: 156/69 132/72 134/69 116/64  Pulse: 106 101 99 78  Resp: 17 20 18 18   Temp:   97.6 F (36.4 C) 97.7 F (36.5 C)  TempSrc:   Oral Oral  SpO2: 100% 98% 100% 100%  Weight:      Height:        Intake/Output Summary (Last 24 hours) at 06/11/16 1302 Last data filed at 06/11/16 1156  Gross per 24 hour  Intake             1141 ml  Output              350 ml  Net              791 ml   Filed Weights   06/10/16 2114  Weight: 49.9 kg (110 lb)    Examination:  General exam: Elderly male lying on bed, looks ill. HEENT: Dry mucous membrane, poor oral hygiene.  Respiratory system: Clear to auscultation. Respiratory effort normal. No wheezing or crackle Cardiovascular system: S1 & S2 heard, RRR.  No pedal edema. Gastrointestinal system: Has colostomy,  bilateral nephrostomy tube, suprapubic catheter. Bowel sound positive. Soft. Central nervous system: Alert awake, oriented to himself only. Extremities: Unable to assess because of encephalopathy. Skin: No rashes, lesions or ulcers Psychiatry: Judgement and insight appear impaired.     Data Reviewed: I have personally reviewed following labs and imaging studies  CBC:  Recent  Labs Lab 06/10/16 2133  WBC 32.0*  NEUTROABS 30.7*  HGB 10.6*  HCT 32.5*  MCV 91.5  PLT [id number]   Basic Metabolic Panel:  Recent Labs Lab 06/10/16 2133  NA 138  K 5.5*  CL 102  CO2 20*  GLUCOSE 302*  BUN 145*  CREATININE 3.13*  CALCIUM 9.5   GFR: Estimated Creatinine Clearance: 12 mL/min (by C-G formula based on SCr of 3.13 mg/dL (H)). Liver Function Tests:  Recent Labs Lab 06/10/16 2133  AST 32  ALT 34  ALKPHOS 218*  BILITOT 0.6  PROT 7.6  ALBUMIN 2.4*   No results for input(s): LIPASE, AMYLASE in the last 168 hours. No results for input(s): AMMONIA in the last 168 hours. Coagulation Profile: No results for input(s): INR, PROTIME in the last 168 hours. Cardiac Enzymes: No results for input(s): CKTOTAL, CKMB, CKMBINDEX, TROPONINI in the last 168 hours. BNP (last 3 results) No results for input(s): PROBNP in the last 8760 hours. HbA1C: No results for input(s): HGBA1C in the last 72 hours. CBG:  Recent Labs Lab 06/11/16 0121 06/11/16 0359 06/11/16 0746 06/11/16 1209  GLUCAP 225* 221* 188* 162*   Lipid Profile: No results for input(s): CHOL, HDL, LDLCALC, TRIG, CHOLHDL, LDLDIRECT in the last 72 hours. Thyroid Function Tests: No results for input(s): TSH, T4TOTAL, FREET4, T3FREE, THYROIDAB in the last 72 hours. Anemia Panel: No results for input(s): VITAMINB12, FOLATE, FERRITIN, TIBC, IRON, RETICCTPCT in the last 72 hours. Sepsis Labs:  Recent Labs Lab 06/10/16 2150  LATICACIDVEN 4.01*    Recent Results (from the past 240 hour(s))  Blood culture (routine x 2)     Status: None (Preliminary result)   Collection Time: 06/10/16  9:37 PM  Result Value Ref Range Status   Specimen Description BLOOD LEFT ANTECUBITAL  Final   Special Requests BOTTLES DRAWN AEROBIC AND ANAEROBIC 5CC EACH  Final   Culture   Final    NO GROWTH < 12 HOURS Performed at [facility]    Report Status PENDING  Incomplete  Blood culture (routine x 2)     Status: None  (Preliminary result)   Collection Time: 06/10/16  9:38 PM  Result Value Ref Range Status   Specimen Description BLOOD RIGHT ARM  Final   Special Requests BOTTLES DRAWN AEROBIC AND ANAEROBIC 5CC  Final   Culture   Final    NO GROWTH < 12 HOURS Performed at [facility]    Report Status PENDING  Incomplete  Urine culture     Status: None (Preliminary result)   Collection Time: 06/10/16 10:58 PM  Result Value Ref Range Status   Specimen Description KIDNEY RIGHT Performed at [facility]   Final   Special Requests NONE  Final   Culture PENDING  Incomplete   Report Status PENDING  Incomplete  MRSA PCR Screening     Status: None   Collection Time: 06/11/16  3:42 AM  Result Value Ref Range Status   MRSA by PCR NEGATIVE NEGATIVE Final    Comment:        The GeneXpert MRSA Assay (FDA approved for NASAL specimens only), is one component of a comprehensive MRSA  colonization surveillance program. It is not intended to diagnose MRSA infection nor to guide or monitor treatment for MRSA infections.          Radiology Studies: Dg Chest 2 View  Result Date: 06/10/2016 CLINICAL DATA:  Altered mental status EXAM: CHEST  2 VIEW COMPARISON:  Chest radiograph and chest CT May 07, 2016 FINDINGS: There is a probable nipple shadow at the right base. Lungs elsewhere are clear. Heart size and pulmonary vascularity are normal. No adenopathy. Temporary pacemaker wires are attached to the right heart. There is a prosthetic aortic valve. No adenopathy. No bone lesions. There is aortic atherosclerosis. IMPRESSION: No edema or consolidation. Probable nipple shadow right base. Repeat study with nipple markers advised to confirm. Heart size within normal limits. Status post aortic valve replacement. Pacemaker wires are attached to the right heart. There is aortic atherosclerosis. Electronically Signed   By: Lowella Grip III M.D.   On: 06/10/2016 21:31   Ct Head Wo Contrast  Result  Date: 06/10/2016 CLINICAL DATA:  Altered mental status EXAM: CT HEAD WITHOUT CONTRAST TECHNIQUE: Contiguous axial images were obtained from the base of the skull through the vertex without intravenous contrast. COMPARISON:  None. FINDINGS: Brain: There is mild diffuse atrophy. There is no intracranial mass, hemorrhage, extra-axial fluid collection, or midline shift. There is mild patchy small vessel disease in the centra semiovale bilaterally. Elsewhere gray-white compartments appear normal. No acute infarct evident. Vascular: There is no hyperdense vessel. There is calcification in each carotid siphon region. Skull: The bony calvarium appears intact. Sinuses/Orbits: Visualized paranasal sinuses are clear. Visualized orbits appear symmetric bilaterally. Other: Mastoid air cells are clear. IMPRESSION: Atrophy with patchy periventricular small vessel disease. No intracranial mass, hemorrhage, or evidence of acute infarct. Areas of arterial vascular calcification noted. Electronically Signed   By: Lowella Grip III M.D.   On: 06/10/2016 21:45        Scheduled Meds: . aspirin EC  81 mg Oral Q1200  . collagenase   Topical Daily  . famotidine  10 mg Oral Daily  . feeding supplement (NEPRO CARB STEADY)  237 mL Oral Q24H  . feeding supplement (PRO-STAT SUGAR FREE 64)  30 mL Oral BID  . fluconazole  100 mg Oral Daily  . insulin aspart  0-9 Units Subcutaneous Q4H  . meropenem (MERREM) IV  500 mg Intravenous Q12H  . metoprolol tartrate  12.5 mg Oral BID  . sodium bicarbonate  650 mg Oral QID   Continuous Infusions: . sodium chloride       LOS: 1 day    Bellarose Burtt Tanna Furry, MD [facility] Pager [phone]  If 7PM-7AM, please contact night-coverage [email or web address] Password [email or web address] 06/11/2016, 1:02 PM

## 2016-06-11 NOTE — Progress Notes (Signed)
Pharmacy Antibiotic Note  Joseph Carrillo is a 82 y.o. male admitted on 06/10/2016 with UTI.  PMH significant for prostate cancer, CKD, CAD, bioprosthetic valve replacement.  Code Sepsis called in the ED.  Patient received Primaxin 500mg  IV x 1 dose @ 22:29 on 11/19.  Pharmacy has been consulted for Primaxin dosing.  Plan:  Primaxin 500mg  IV q12h  Watch CrCl.  Previous Scr ~ 2  Primaxin not recommended for use when CrCl < 15 ml/min.  F/U AM Scr   Height: 5\' 11"  (180.3 cm) Weight: 110 lb (49.9 kg) IBW/kg (Calculated) : 75.3  Temp (24hrs), Avg:97.6 F (36.4 C), Min:97.5 F (36.4 C), Max:97.6 F (36.4 C)   Recent Labs Lab 06/10/16 2133 06/10/16 2150  WBC 32.0*  --   CREATININE 3.13*  --   LATICACIDVEN  --  4.01*    Estimated Creatinine Clearance: 12 mL/min (by C-G formula based on SCr of 3.13 mg/dL (H)).    No Known Allergies  Antimicrobials this admission: 11/19 Primaxin >>    Dose adjustments this admission:    Microbiology results: 11/19 BCx: sent 11/19 UCx: sent   Thank you for allowing pharmacy to be a part of this patient's care.  Everette Rank, PharmD 06/11/2016 12:53 AM

## 2016-06-11 NOTE — Clinical Social Work Note (Signed)
Clinical Social Work Assessment  Patient Details  Name: Joseph Carrillo MRN: [id number] Date of Birth: 11/26/1926  Date of referral:  06/11/16               Reason for consult:  Facility Placement                Permission sought to share information with:  Customer service manager, Psychiatrist Permission granted to share information::     Name::     Joseph Carrillo   Agency::  [facility]  Relationship::  Spouse-Mary/Daughter-Deborah   Contact Information:  [phone]  Housing/Transportation Living arrangements for the past 2 months:  [place] of Information:  Adult Children Patient Interpreter Needed:  None Criminal Activity/Legal Involvement Pertinent to Current Situation/Hospitalization:  No - Comment as needed Significant Relationships:  Adult Children Lives with:  Facility Resident Do you feel safe going back to the place where you live?  Yes Need for family participation in patient care:  Yes (Comment)  Care giving concerns:  End of Life   Social Worker assessment / plan:  Palliative has recommended residential hospice at this time. LCSWA met with patient and pt. Wife and family at bedside. They are agreeable to [facility] if bed is available.   LCSWA informed liaison Harmon Pier at [facility].  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[employer]) PT Recommendations:  Not assessed at this time Information / Referral to community resources:  [place]  Patient/Family's Response to care:  Agreeable  Patient/Family's Understanding of and Emotional Response to Diagnosis, Current Treatment, and Prognosis:  Family understands patient prognosis is 2 weeks per Pallative   Emotional Assessment Appearance:  Appears stated age Attitude/Demeanor/Rapport:    Affect (typically observed):  Quiet Orientation:  Oriented to Self Alcohol / Substance use:  Not Applicable Psych  involvement (Current and /or in the community):  No (Comment)  Discharge Needs  Concerns to be addressed:  Care Coordination, Discharge Planning Concerns Readmission within the last 30 days:  No Current discharge risk:  None Barriers to Discharge:  Continued Medical Work up   [employer], LCSW 06/11/2016, 12:58 PM

## 2016-06-11 NOTE — Progress Notes (Signed)
Unable to complete admission assessment questions due to pt cognitive status. Pt unable to answer questions for the most part only nods head yes or no. No family present. Will pass along to day shift to complete when family arrives.

## 2016-06-12 LAB — URINE CULTURE

## 2016-06-12 LAB — GLUCOSE, CAPILLARY
GLUCOSE-CAPILLARY: 111 mg/dL — AB (ref 65–99)
GLUCOSE-CAPILLARY: 124 mg/dL — AB (ref 65–99)
GLUCOSE-CAPILLARY: 79 mg/dL (ref 65–99)
GLUCOSE-CAPILLARY: 93 mg/dL (ref 65–99)
Glucose-Capillary: 79 mg/dL (ref 65–99)
Glucose-Capillary: 97 mg/dL (ref 65–99)
Glucose-Capillary: 144 mg/dL — ABNORMAL HIGH (ref 65–99)

## 2016-06-12 MED ORDER — COLLAGENASE 250 UNIT/GM EX OINT: TOPICAL_OINTMENT | Freq: Every day | CUTANEOUS | 0 refills | Status: AC

## 2016-06-12 MED ORDER — CHLORHEXIDINE GLUCONATE 0.12 % MT SOLN: 15.0000 mL | Freq: Two times a day (BID) | OROMUCOSAL | 0 refills | Status: AC

## 2016-06-12 NOTE — Discharge Summary (Addendum)
Physician Discharge Summary  Joseph Carrillo [id number] DOB: 1926/09/09 DOA: 06/10/2016  PCP: [name], MD  Admit date: 06/10/2016 Discharge date: 06/12/2016  Admitted From:From facility  [facility] Disposition: [place]    Recommendations for Outpatient Follow-up:  1. Follow up with PCP in 1-2 weeks  Home Health:hospice  Equipment/Devices:none Discharge Condition:hospice CODE STATUS:DNR Diet recommendation:regular, as tolerated.  Brief/Interim Summary:82 y.o.malewith medical history significant ofprostate cancer, CKD, bilateral hydronephrosis status post nephrostomy tubes, malnutrition, anemia, leukocytosis, colostomy history of urostomy, CAD, valve disease with bioprosthetic valve replacement presented with decreasing mental status for the past week less verbal than usual.He has not been eating well and urine output has decreased. Pt is DNR comfort care.  # Sepsis due to urinary tract infection in the setting of suprapubic catheter: -Patient with poor prognosis. Discussed with the patient's daughter at length. Focused on comfort care. Social worker consulted for hospice evaluation.Treated with IV meropenem while in the hospital. Evaluated by palliative care and hospice care team. Patient is now discharged to hospice.  #Acute on chronic kidney disease stage 3 likely due to dehydration, sepsis: No further lab. Patient has bilateral nephrostomy tubes and Foley catheter.  #Acute on chronic encephalopathy likely metabolic: Likely in the setting of sepsis, kidney disease. CT scan of head with no mass or infarction. Patient was alert awake and talking today likely around baseline mental status.  #Hypokalemia on admission.  #Prostate cancer resulting in multiple surgeries including bilateral nephrostomy tube placement, has fistula and chronic infection. Overall poor prognosis.  #Severe protein calorie malnutrition: Encourage oral intake.  #stage 1 to left hip,  skin remains intact. POA. Dressing change  #Poor prognosis: Multiple comorbidities and declining in medical and physical condition. Patient is DO NOT RESUSCITATE comfort care. Palliative care service and hospice care consult appreciated. Discussed with the Education officer, museum. Patient is being discharged to hospice.  Discharge Diagnoses:  Active Problems:   Colostomy in place [facility])   Prostate cancer ([place])   Hyperkalemia   Urinary tract infection associated with nephrostomy catheter (HCC)   Leukocytosis   Protein-calorie malnutrition, severe   Acute on chronic renal failure (HCC)   CAD, multiple vessel   Dehydration   Sepsis ([place])   Sacral decubitus ulcer    Discharge Instructions  Discharge Instructions    Diet general    Complete by:  As directed    Increase activity slowly    Complete by:  As directed        Medication List    TAKE these medications   acetaminophen 325 MG tablet Commonly known as:  TYLENOL Take 2 tablets (650 mg total) by mouth every 6 (six) hours as needed for mild pain (or Fever >/= 101).   albuterol (2.5 MG/3ML) 0.083% nebulizer solution Commonly known as:  PROVENTIL Take 3 mLs (2.5 mg total) by nebulization every 6 (six) hours as needed for wheezing or shortness of breath.   aspirin EC 81 MG tablet Take 81 mg by mouth daily at 12 noon.   baclofen 5 mg Tabs tablet Commonly known as:  LIORESAL Take 5 mg by mouth every 8 (eight) hours as needed for muscle spasms.   busPIRone 15 MG tablet Commonly known as:  BUSPAR Take 15 mg by mouth every 12 (twelve) hours as needed (agitation).   cephALEXin 500 MG capsule Commonly known as:  KEFLEX Take 1 capsule (500 mg total) by mouth daily.   chlorhexidine 0.12 % solution Commonly known as:  PERIDEX 15 mLs by Mouth Rinse route 2 (two)  times daily.   collagenase ointment Commonly known as:  SANTYL Apply topically daily. Start taking on:  06/13/2016   docusate sodium 100 MG capsule Commonly known as:   COLACE Take 100 mg by mouth 2 (two) times daily.   feeding supplement (NEPRO CARB STEADY) Liqd Take 237 mLs by mouth daily.   feeding supplement (PRO-STAT SUGAR FREE 64) Liqd Take 30 mLs by mouth 2 (two) times daily.   fluconazole 100 MG tablet Commonly known as:  DIFLUCAN Take 1 tablet (100 mg total) by mouth daily.   HYDROcodone-acetaminophen 5-325 MG tablet Commonly known as:  NORCO/VICODIN Take 1 tablet by mouth every 6 (six) hours as needed.   metoprolol tartrate 25 MG tablet Commonly known as:  LOPRESSOR Take 0.5 tablets (12.5 mg total) by mouth 2 (two) times daily.   nitroGLYCERIN 0.4 MG SL tablet Commonly known as:  NITROSTAT Place 0.4 mg under the tongue every 5 (five) minutes as needed for chest pain.   ranitidine 150 MG tablet Commonly known as:  ZANTAC Take 150 mg by mouth 2 (two) times daily.   sodium bicarbonate 650 MG tablet Take 650 mg by mouth 4 (four) times daily.   UNABLE TO FIND Take 120 mLs by mouth 2 (two) times daily. House 2.0 for caloric and weight support Med Pass      Follow-up Information    COPLAND,JESSICA, MD. Schedule an appointment as soon as possible for a visit in 1 week(s).   Specialty:  Family Medicine Contact information: [place] [address] [phone]          No Known Allergies  Consultations:Palliative care service   Procedures/Studies: None   Subjective: Patient was seen and examined at bedside. Patient reported feeling okay. Denied pain, nausea, vomiting, chest pain or shortness of breath.   Discharge Exam: Vitals:   06/11/16 2039 06/12/16 0425  BP: (!) 125/58 (!) 153/61  Pulse: 80 74  Resp: 19 16  Temp: 97.8 F (36.6 C) 98 F (36.7 C)   Vitals:   06/11/16 1458 06/11/16 1507 06/11/16 2039 06/12/16 0425  BP: 126/89  (!) 125/58 (!) 153/61  Pulse: 83  80 74  Resp: 20  19 16   Temp:  97.5 F (36.4 C) 97.8 F (36.6 C) 98 F (36.7 C)  TempSrc:  Oral Oral Oral  SpO2:  100%  90% 98%  Weight:      Height:        General: Cachectic ill-looking male lying on bed. Cardiovascular: RRR, S1/S2 +, no rubs, no gallops Respiratory: CTA bilaterally, no wheezing, no rhonchi Abdominal: Bilateral nephrostomy tube, suprapubic catheter, colostomy tube and bag in place. Extremities: No edema. Neurology: Alert, awake and following commands.  The results of significant diagnostics from this hospitalization (including imaging, microbiology, ancillary and laboratory) are listed below for reference.     Microbiology: Recent Results (from the past 240 hour(s))  Urine culture     Status: Abnormal (Preliminary result)   Collection Time: 06/10/16  9:07 PM  Result Value Ref Range Status   Specimen Description URINE, RANDOM  Final   Special Requests NONE  Final   Culture (A)  Final    >=100,000 COLONIES/mL ESCHERICHIA COLI >=100,000 COLONIES/mL PSEUDOMONAS AERUGINOSA SUSCEPTIBILITIES TO FOLLOW Performed at [facility]    Report Status PENDING  Incomplete  Blood culture (routine x 2)     Status: None (Preliminary result)   Collection Time: 06/10/16  9:37 PM  Result Value Ref Range Status  Specimen Description BLOOD LEFT ANTECUBITAL  Final   Special Requests BOTTLES DRAWN AEROBIC AND ANAEROBIC 5CC EACH  Final   Culture   Final    NO GROWTH 1 DAY Performed at [facility]    Report Status PENDING  Incomplete  Blood culture (routine x 2)     Status: None (Preliminary result)   Collection Time: 06/10/16  9:38 PM  Result Value Ref Range Status   Specimen Description BLOOD RIGHT ARM  Final   Special Requests BOTTLES DRAWN AEROBIC AND ANAEROBIC 5CC  Final   Culture   Final    NO GROWTH 1 DAY Performed at [facility]    Report Status PENDING  Incomplete  Urine culture     Status: None (Preliminary result)   Collection Time: 06/10/16 10:58 PM  Result Value Ref Range Status   Specimen Description KIDNEY RIGHT  Final   Special Requests NONE   Final   Culture   Final    CULTURE REINCUBATED FOR BETTER GROWTH Performed at [facility]    Report Status PENDING  Incomplete  Urine culture     Status: Abnormal   Collection Time: 06/10/16 10:59 PM  Result Value Ref Range Status   Specimen Description URINE, RANDOM LEFT NEPHROSTOMY TUBE  Final   Special Requests NONE  Final   Culture >=100,000 COLONIES/mL YEAST (A)  Final   Report Status 06/12/2016 FINAL  Final  MRSA PCR Screening     Status: None   Collection Time: 06/11/16  3:42 AM  Result Value Ref Range Status   MRSA by PCR NEGATIVE NEGATIVE Final    Comment:        The GeneXpert MRSA Assay (FDA approved for NASAL specimens only), is one component of a comprehensive MRSA colonization surveillance program. It is not intended to diagnose MRSA infection nor to guide or monitor treatment for MRSA infections.      Labs: BNP (last 3 results) No results for input(s): BNP in the last 8760 hours. Basic Metabolic Panel:  Recent Labs Lab 06/10/16 2133  NA 138  K 5.5*  CL 102  CO2 20*  GLUCOSE 302*  BUN 145*  CREATININE 3.13*  CALCIUM 9.5   Liver Function Tests:  Recent Labs Lab 06/10/16 2133  AST 32  ALT 34  ALKPHOS 218*  BILITOT 0.6  PROT 7.6  ALBUMIN 2.4*   No results for input(s): LIPASE, AMYLASE in the last 168 hours. No results for input(s): AMMONIA in the last 168 hours. CBC:  Recent Labs Lab 06/10/16 2133  WBC 32.0*  NEUTROABS 30.7*  HGB 10.6*  HCT 32.5*  MCV 91.5  PLT 221   Cardiac Enzymes: No results for input(s): CKTOTAL, CKMB, CKMBINDEX, TROPONINI in the last 168 hours. BNP: Invalid input(s): POCBNP CBG:  Recent Labs Lab 06/11/16 2039 06/12/16 0129 06/12/16 0415 06/12/16 0806 06/12/16 1153  GLUCAP 144* 111* 93 79 97   D-Dimer No results for input(s): DDIMER in the last 72 hours. Hgb A1c No results for input(s): HGBA1C in the last 72 hours. Lipid Profile No results for input(s): CHOL, HDL, LDLCALC, TRIG,  CHOLHDL, LDLDIRECT in the last 72 hours. Thyroid function studies No results for input(s): TSH, T4TOTAL, T3FREE, THYROIDAB in the last 72 hours.  Invalid input(s): FREET3 Anemia work up No results for input(s): VITAMINB12, FOLATE, FERRITIN, TIBC, IRON, RETICCTPCT in the last 72 hours. Urinalysis    Component Value Date/Time   COLORURINE YELLOW 06/10/2016 2259   APPEARANCEUR TURBID (A) 06/10/2016 2259  LABSPEC 1.019 06/10/2016 2259   PHURINE 5.0 06/10/2016 2259   GLUCOSEU NEGATIVE 06/10/2016 2259   HGBUR LARGE (A) 06/10/2016 2259   BILIRUBINUR NEGATIVE 06/10/2016 2259   KETONESUR NEGATIVE 06/10/2016 2259   PROTEINUR 30 (A) 06/10/2016 2259   NITRITE NEGATIVE 06/10/2016 2259   LEUKOCYTESUR LARGE (A) 06/10/2016 2259   Sepsis Labs Invalid input(s): PROCALCITONIN,  WBC,  LACTICIDVEN Microbiology Recent Results (from the past 240 hour(s))  Urine culture     Status: Abnormal (Preliminary result)   Collection Time: 06/10/16  9:07 PM  Result Value Ref Range Status   Specimen Description URINE, RANDOM  Final   Special Requests NONE  Final   Culture (A)  Final    >=100,000 COLONIES/mL ESCHERICHIA COLI >=100,000 COLONIES/mL PSEUDOMONAS AERUGINOSA SUSCEPTIBILITIES TO FOLLOW Performed at [facility]    Report Status PENDING  Incomplete  Blood culture (routine x 2)     Status: None (Preliminary result)   Collection Time: 06/10/16  9:37 PM  Result Value Ref Range Status   Specimen Description BLOOD LEFT ANTECUBITAL  Final   Special Requests BOTTLES DRAWN AEROBIC AND ANAEROBIC 5CC EACH  Final   Culture   Final    NO GROWTH 1 DAY Performed at [facility]    Report Status PENDING  Incomplete  Blood culture (routine x 2)     Status: None (Preliminary result)   Collection Time: 06/10/16  9:38 PM  Result Value Ref Range Status   Specimen Description BLOOD RIGHT ARM  Final   Special Requests BOTTLES DRAWN AEROBIC AND ANAEROBIC 5CC  Final   Culture   Final    NO GROWTH 1  DAY Performed at [facility]    Report Status PENDING  Incomplete  Urine culture     Status: None (Preliminary result)   Collection Time: 06/10/16 10:58 PM  Result Value Ref Range Status   Specimen Description KIDNEY RIGHT  Final   Special Requests NONE  Final   Culture   Final    CULTURE REINCUBATED FOR BETTER GROWTH Performed at [facility]    Report Status PENDING  Incomplete  Urine culture     Status: Abnormal   Collection Time: 06/10/16 10:59 PM  Result Value Ref Range Status   Specimen Description URINE, RANDOM LEFT NEPHROSTOMY TUBE  Final   Special Requests NONE  Final   Culture >=100,000 COLONIES/mL YEAST (A)  Final   Report Status 06/12/2016 FINAL  Final  MRSA PCR Screening     Status: None   Collection Time: 06/11/16  3:42 AM  Result Value Ref Range Status   MRSA by PCR NEGATIVE NEGATIVE Final    Comment:        The GeneXpert MRSA Assay (FDA approved for NASAL specimens only), is one component of a comprehensive MRSA colonization surveillance program. It is not intended to diagnose MRSA infection nor to guide or monitor treatment for MRSA infections.      Time coordinating discharge: Over 30 minutes  SIGNED:   Rosita Fire, MD  [facility] 06/12/2016, 2:05 PM  If 7PM-7AM, please contact night-coverage [email or web address] Password [email or web address]

## 2016-06-12 NOTE — Consult Note (Signed)
[facility] room available for [name] today. Spoke with spouse and daughter by phone to confirm interest. Spouse deferred to daughter to complete paper work for transfer today. Plan to meet daughter at 1:00 to complete paper work. Will update CSW when paper work complete.   Please fax discharge summary to [phone].  RN please call report to [phone].  Thank you,  Erling Conte, LCSW [phone]

## 2016-06-12 NOTE — Progress Notes (Signed)
LCSWA met patient and daughter-Deborah at bedside, discussed patient disposition and discharge process. LCSWA called patient wife-Mary [phone] and provided information for [facility].  [facility] to be called for transport. Discharge Summary faxed.   Kathrin Greathouse, Latanya Presser, MSW Clinical Social Worker 5E and Psychiatric Service Line [phone] 06/12/2016  1:21 PM

## 2016-06-12 NOTE — Progress Notes (Signed)
Report called to [facility].  

## 2016-06-12 NOTE — Progress Notes (Signed)
Date: June 12, 2016 Discharge orders checked for needs. No case management needs present at time of discharge. Velva Harman, RN, BSN, [place]   [phone]

## 2016-06-13 LAB — URINE CULTURE: Culture: >=100000 — AB

## 2016-06-13 NOTE — Progress Notes (Signed)
Patient transferred to [facility] via EMS. Daughter called.

## 2016-06-14 LAB — URINE CULTURE

## 2016-06-16 LAB — CULTURE, BLOOD (ROUTINE X 2)
CULTURE: NO GROWTH
CULTURE: NO GROWTH

## 2016-06-19 NOTE — Progress Notes (Signed)
Late entry for coding clarification. Patient's sepsis on admission most likely related with UTI and having suprapubic catheter.

## 2016-06-22 DEATH — deceased

## 2016-07-02 ENCOUNTER — Other Ambulatory Visit (HOSPITAL_COMMUNITY): Payer: 59

## 2016-07-23 DEATH — deceased

## 2018-08-08 IMAGING — XA IR NEPHROSTOMY PLACEMENT RIGHT
9 series · 9 of 9 positions shown · non-contrast
Comparison: CT of the abdomen pelvis - 03/28/2016;

INDICATION: History of prostate cancer with chronic suprapubic Foley catheter,
now with acute on chronic elevation of serum creatinine with
cross-sectional imaging demonstrated mild-to-moderate bilateral
pelvicaliectasis.

Request made for placement of bilateral percutaneous nephrostomy
catheters for renal preservation purposes.
EXAM:
1. ULTRASOUND GUIDANCE FOR PUNCTURE OF THE BILATERAL RENAL
COLLECTING SYSTEMS
2. BILATERAL PERCUTANEOUS NEPHROSTOMY TUBE PLACEMENT.

[Series 1: fl (-) angio · 1 of 1 slices shown (1 of 9)]
[im 1/1]
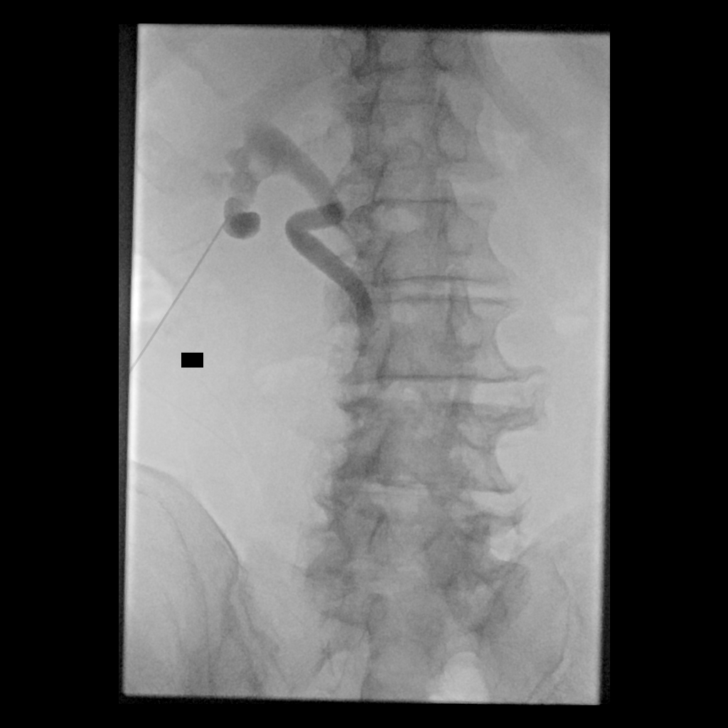

[Series 2: fl (-) angio · 1 of 1 slices shown (2 of 9)]
[im 1/1]
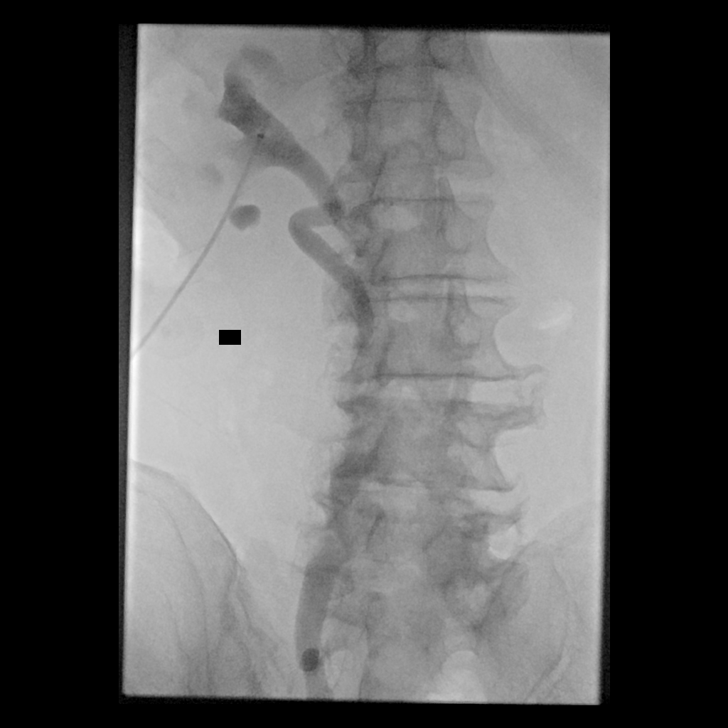

[Series 3: fl (-) angio · 1 of 1 slices shown (3 of 9)]
[im 1/1]
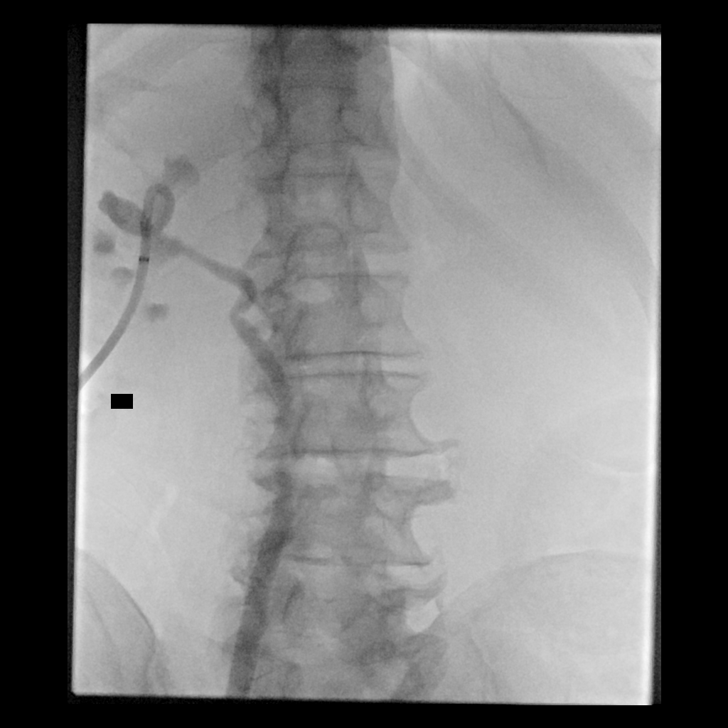

[Series 4: fl (-) angio · 1 of 1 slices shown (4 of 9)]
[im 1/1]
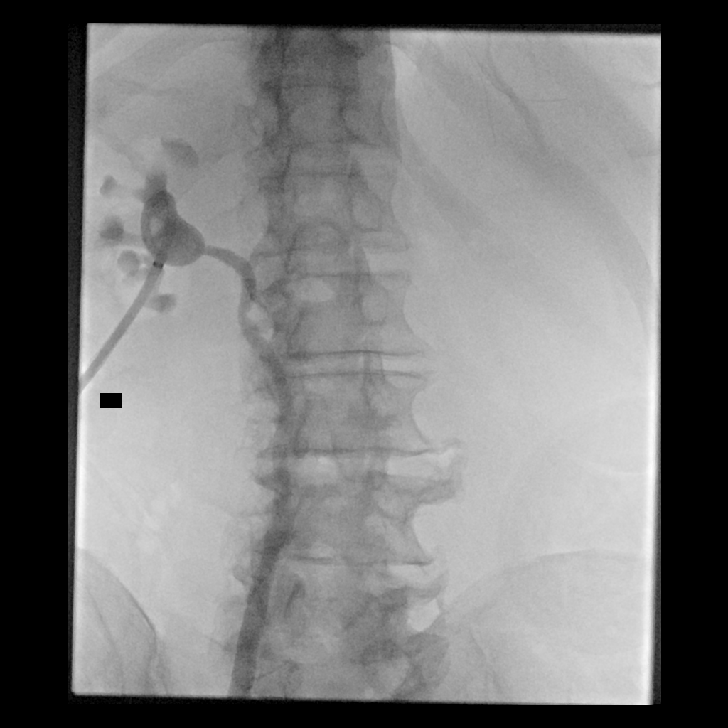

[Series 5: fl (-) angio · 1 of 1 slices shown (5 of 9)]
[im 1/1]
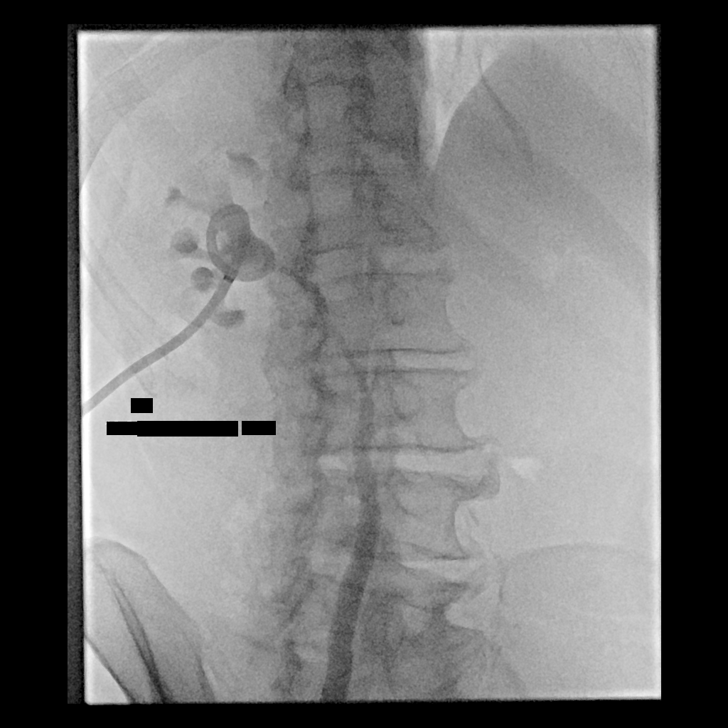

[Series 6: fl (-) angio · 1 of 1 slices shown (6 of 9)]
[im 1/1]
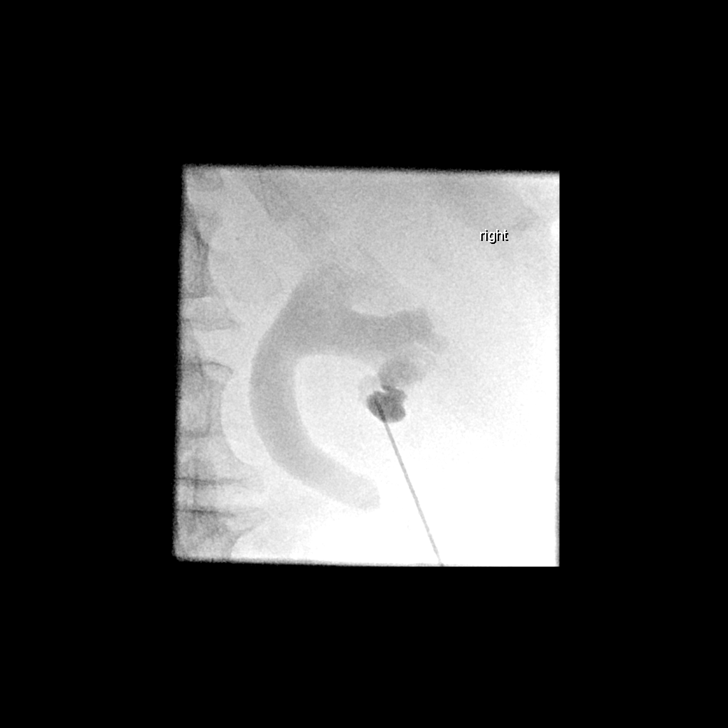

[Series 7: fl (-) angio · 1 of 1 slices shown (7 of 9)]
[im 1/1]
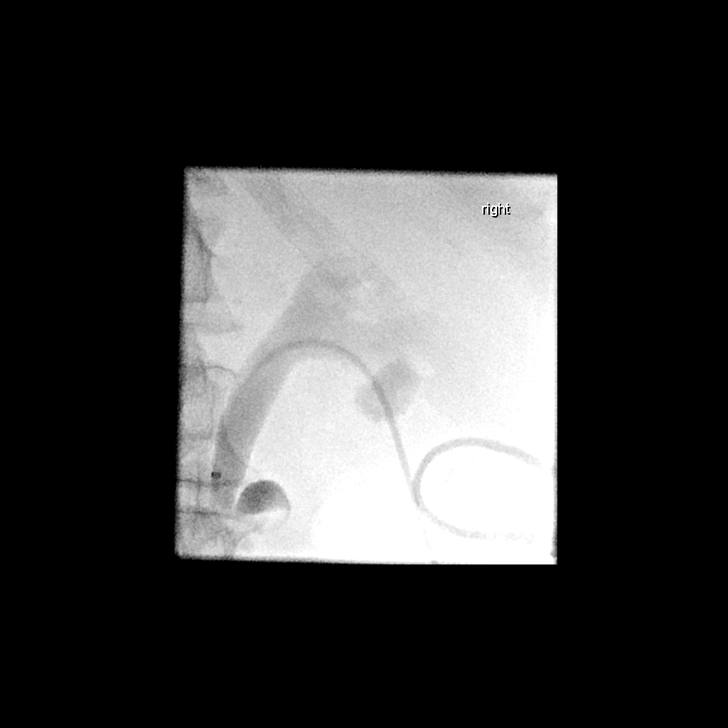

[Series 8: fl (-) angio · 1 of 1 slices shown (8 of 9)]
[im 1/1]
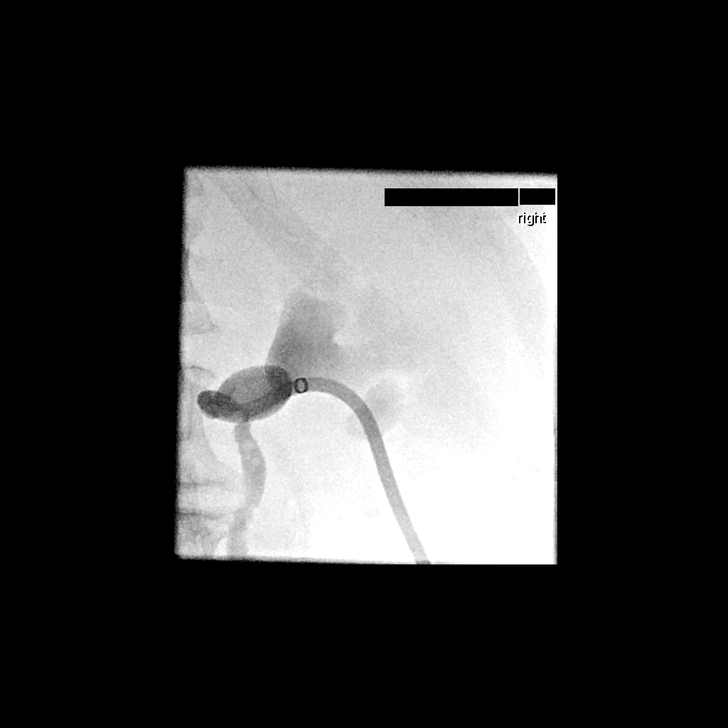

[Series 9: fl (-) angio · 1 of 1 slices shown (9 of 9)]
[im 1/1]
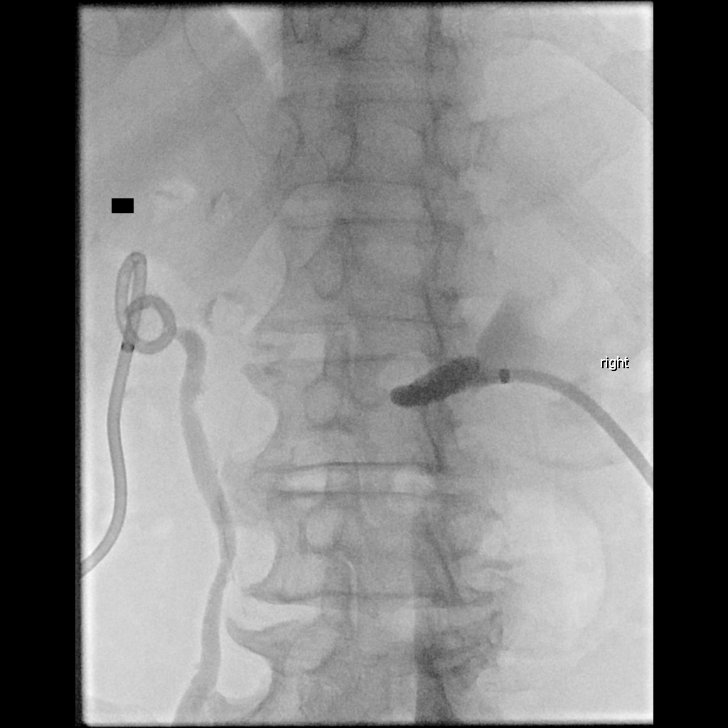

[9 of 9 positions shown; findings below may reference images not displayed]

renal ultrasound
- 03/29/2016

MEDICATIONS:
Ciprofloxacin 400 mg IV; The antibiotic was administered in an
appropriate time frame prior to skin puncture.

ANESTHESIA/SEDATION:
Fentanyl 75 mcg IV; Versed 2 mg IV

Total Moderate Sedation Time

25 minutes.

CONTRAST:  A total of 20 mL Isovue 300 was administered into the
renal collecting systems.

FLUOROSCOPY TIME:  5 minutes 36 seconds (20 mGy)

COMPLICATIONS:
None immediate.

PROCEDURE:
The procedure, risks, benefits, and alternatives were explained to
the patient. Questions regarding the procedure were encouraged and
answered. The patient understands and consents to the procedure. A
timeout was performed prior to the initiation of the procedure.

The bilateral flanks were prepped with Betadine in a sterile
fashion, and a sterile drape was applied covering the operative
field. A sterile gown and sterile gloves were used for the
procedure. Local anesthesia was provided with 1% Lidocaine with
epinephrine. Ultrasound was used to localize the left kidney. Under
direct ultrasound guidance, a 21 gauge needle was advanced into the
renal collecting system. An ultrasound image documentation was
performed. Access within the collecting system was confirmed with
the efflux of urine followed by contrast injection.

Over a Nitrex wire, an Accustick set was advanced into the renal
collecting system. Contrast injection was injected into the
collecting system. Over a guide wire, a 10-French percutaneous
nephrostomy catheter was advanced into the collecting system where
the coil was formed and locked. Contrast was injected and several
sport radiographs were obtained in various obliquities confirming
access. The catheter was secured at the skin with a Prolene
retention suture and a gravity bag was placed.

Attention was now paid towards placement of a right-sided
percutaneous nephrostomy catheter. The identical procedure was
performed ultimately allowing placement of a 10 French percutaneous
nephrostomy catheter via a posterior inferior calyx with end coiled
and locked within the right renal pelvis. The catheter was secured
to skin with a Prolene retention suture and connected to a gravity
bag.

Dressings were placed. The patient tolerated procedure well without
immediate postprocedural complication.
FINDINGS: Ultrasound scanning demonstrates very mild dilatation of the
bilateral renal collecting systems, unchanged to minimally improved
compared to the 03/29/2016 examination.

Under direct ultrasound guidance, posterior inferior calices were
targeted bilaterally allowing placement of bilateral 10-French
percutaneous nephrostomy catheter under intermittent fluoroscopic
guidance.

Contrast injection confirmed appropriate positioning.
IMPRESSION: Successful ultrasound and fluoroscopic guided placement of bilateral
10 French PCNs.
